# Patient Record
Sex: Male | Born: 1935 | Race: White | Hispanic: No | Marital: Married | Smoking: Never smoker
Health system: Southern US, Community
[De-identification: ages and names within clinical notes are randomized; demographics above are authoritative.]

## PROBLEM LIST (undated history)

## (undated) DIAGNOSIS — E119 Type 2 diabetes mellitus without complications: Secondary | ICD-10-CM

## (undated) DIAGNOSIS — C801 Malignant (primary) neoplasm, unspecified: Secondary | ICD-10-CM

## (undated) DIAGNOSIS — M1990 Unspecified osteoarthritis, unspecified site: Secondary | ICD-10-CM

## (undated) DIAGNOSIS — I10 Essential (primary) hypertension: Secondary | ICD-10-CM

## (undated) HISTORY — PX: BACK SURGERY: SHX140

## (undated) HISTORY — PX: CHOLECYSTECTOMY: SHX55

## (undated) HISTORY — PX: COLOSTOMY: SHX63

---

## 1999-06-25 ENCOUNTER — Encounter (INDEPENDENT_AMBULATORY_CARE_PROVIDER_SITE_OTHER): Payer: Self-pay | Admitting: *Deleted

## 1999-06-25 ENCOUNTER — Ambulatory Visit (HOSPITAL_BASED_OUTPATIENT_CLINIC_OR_DEPARTMENT_OTHER): Admission: RE | Admit: 1999-06-25 | Discharge: 1999-06-25 | Payer: Self-pay | Admitting: Orthopedic Surgery

## 2000-04-14 ENCOUNTER — Ambulatory Visit (HOSPITAL_BASED_OUTPATIENT_CLINIC_OR_DEPARTMENT_OTHER): Admission: RE | Admit: 2000-04-14 | Discharge: 2000-04-14 | Payer: Self-pay | Admitting: Orthopedic Surgery

## 2000-04-14 ENCOUNTER — Encounter (INDEPENDENT_AMBULATORY_CARE_PROVIDER_SITE_OTHER): Payer: Self-pay | Admitting: Specialist

## 2004-04-12 ENCOUNTER — Ambulatory Visit (HOSPITAL_COMMUNITY): Admission: RE | Admit: 2004-04-12 | Discharge: 2004-04-12 | Payer: Self-pay | Admitting: Internal Medicine

## 2004-04-15 ENCOUNTER — Ambulatory Visit (HOSPITAL_COMMUNITY): Admission: RE | Admit: 2004-04-15 | Discharge: 2004-04-15 | Payer: Self-pay | Admitting: Internal Medicine

## 2004-04-24 ENCOUNTER — Encounter (HOSPITAL_COMMUNITY): Admission: RE | Admit: 2004-04-24 | Discharge: 2004-05-24 | Payer: Self-pay | Admitting: Oncology

## 2004-04-24 ENCOUNTER — Encounter: Admission: RE | Admit: 2004-04-24 | Discharge: 2004-04-24 | Payer: Self-pay | Admitting: Oncology

## 2004-04-25 ENCOUNTER — Ambulatory Visit: Admission: RE | Admit: 2004-04-25 | Discharge: 2004-07-08 | Payer: Self-pay | Admitting: Radiation Oncology

## 2004-04-25 ENCOUNTER — Ambulatory Visit (HOSPITAL_COMMUNITY): Admission: RE | Admit: 2004-04-25 | Discharge: 2004-04-25 | Payer: Self-pay | Admitting: Oncology

## 2004-05-06 ENCOUNTER — Ambulatory Visit (HOSPITAL_COMMUNITY): Admission: RE | Admit: 2004-05-06 | Discharge: 2004-05-06 | Payer: Self-pay | Admitting: Radiation Oncology

## 2004-05-24 ENCOUNTER — Ambulatory Visit (HOSPITAL_COMMUNITY): Payer: Self-pay | Admitting: Oncology

## 2004-05-27 ENCOUNTER — Encounter: Admission: RE | Admit: 2004-05-27 | Discharge: 2004-05-27 | Payer: Self-pay | Admitting: Oncology

## 2004-05-27 ENCOUNTER — Encounter (HOSPITAL_COMMUNITY): Admission: RE | Admit: 2004-05-27 | Discharge: 2004-06-26 | Payer: Self-pay | Admitting: Oncology

## 2004-07-09 ENCOUNTER — Encounter: Admission: RE | Admit: 2004-07-09 | Discharge: 2004-07-09 | Payer: Self-pay | Admitting: Oncology

## 2004-07-09 ENCOUNTER — Encounter (HOSPITAL_COMMUNITY): Admission: RE | Admit: 2004-07-09 | Discharge: 2004-08-08 | Payer: Self-pay | Admitting: Oncology

## 2004-07-10 ENCOUNTER — Ambulatory Visit (HOSPITAL_COMMUNITY): Admission: RE | Admit: 2004-07-10 | Discharge: 2004-07-10 | Payer: Self-pay | Admitting: Oncology

## 2004-07-24 ENCOUNTER — Ambulatory Visit (HOSPITAL_COMMUNITY): Payer: Self-pay | Admitting: Oncology

## 2004-08-09 ENCOUNTER — Inpatient Hospital Stay (HOSPITAL_COMMUNITY): Admission: RE | Admit: 2004-08-09 | Discharge: 2004-08-17 | Payer: Self-pay | Admitting: General Surgery

## 2004-09-04 ENCOUNTER — Encounter (HOSPITAL_COMMUNITY): Admission: RE | Admit: 2004-09-04 | Discharge: 2004-10-04 | Payer: Self-pay | Admitting: Oncology

## 2004-09-04 ENCOUNTER — Encounter: Admission: RE | Admit: 2004-09-04 | Discharge: 2004-09-04 | Payer: Self-pay | Admitting: Oncology

## 2005-01-30 ENCOUNTER — Encounter: Admission: RE | Admit: 2005-01-30 | Discharge: 2005-01-30 | Payer: Self-pay | Admitting: Oncology

## 2005-01-30 ENCOUNTER — Ambulatory Visit (HOSPITAL_COMMUNITY): Payer: Self-pay | Admitting: Oncology

## 2005-01-30 ENCOUNTER — Encounter (HOSPITAL_COMMUNITY): Admission: RE | Admit: 2005-01-30 | Discharge: 2005-03-01 | Payer: Self-pay | Admitting: Oncology

## 2005-07-10 ENCOUNTER — Ambulatory Visit (HOSPITAL_COMMUNITY): Payer: Self-pay | Admitting: Oncology

## 2005-07-10 ENCOUNTER — Encounter: Admission: RE | Admit: 2005-07-10 | Discharge: 2005-07-10 | Payer: Self-pay | Admitting: Oncology

## 2005-07-10 ENCOUNTER — Encounter (HOSPITAL_COMMUNITY): Admission: RE | Admit: 2005-07-10 | Discharge: 2005-07-10 | Payer: Self-pay | Admitting: Oncology

## 2005-07-15 ENCOUNTER — Ambulatory Visit (HOSPITAL_COMMUNITY): Admission: RE | Admit: 2005-07-15 | Discharge: 2005-07-15 | Payer: Self-pay | Admitting: Oncology

## 2005-07-23 ENCOUNTER — Encounter: Admission: RE | Admit: 2005-07-23 | Discharge: 2005-07-23 | Payer: Self-pay | Admitting: Oncology

## 2005-09-16 ENCOUNTER — Emergency Department (HOSPITAL_COMMUNITY): Admission: EM | Admit: 2005-09-16 | Discharge: 2005-09-16 | Payer: Self-pay | Admitting: Emergency Medicine

## 2006-01-09 ENCOUNTER — Encounter: Admission: RE | Admit: 2006-01-09 | Discharge: 2006-01-09 | Payer: Self-pay | Admitting: Oncology

## 2006-01-09 ENCOUNTER — Encounter (HOSPITAL_COMMUNITY): Admission: RE | Admit: 2006-01-09 | Discharge: 2006-02-08 | Payer: Self-pay | Admitting: Oncology

## 2006-01-12 ENCOUNTER — Ambulatory Visit (HOSPITAL_COMMUNITY): Admission: RE | Admit: 2006-01-12 | Discharge: 2006-01-12 | Payer: Self-pay | Admitting: Oncology

## 2006-01-16 ENCOUNTER — Ambulatory Visit (HOSPITAL_COMMUNITY): Payer: Self-pay | Admitting: Oncology

## 2007-01-14 ENCOUNTER — Encounter (HOSPITAL_COMMUNITY): Admission: RE | Admit: 2007-01-14 | Discharge: 2007-02-13 | Payer: Self-pay | Admitting: Oncology

## 2007-01-14 ENCOUNTER — Ambulatory Visit (HOSPITAL_COMMUNITY): Payer: Self-pay | Admitting: Oncology

## 2007-01-15 ENCOUNTER — Ambulatory Visit (HOSPITAL_COMMUNITY): Admission: RE | Admit: 2007-01-15 | Discharge: 2007-01-15 | Payer: Self-pay | Admitting: Oncology

## 2007-07-30 ENCOUNTER — Ambulatory Visit (HOSPITAL_COMMUNITY): Admission: RE | Admit: 2007-07-30 | Discharge: 2007-07-30 | Payer: Self-pay | Admitting: Pediatrics

## 2008-01-14 ENCOUNTER — Encounter (HOSPITAL_COMMUNITY): Admission: RE | Admit: 2008-01-14 | Discharge: 2008-02-13 | Payer: Self-pay | Admitting: Oncology

## 2008-01-14 ENCOUNTER — Ambulatory Visit (HOSPITAL_COMMUNITY): Payer: Self-pay | Admitting: Oncology

## 2009-01-25 ENCOUNTER — Ambulatory Visit (HOSPITAL_COMMUNITY): Payer: Self-pay | Admitting: Oncology

## 2009-02-14 ENCOUNTER — Encounter: Payer: Self-pay | Admitting: Internal Medicine

## 2010-01-23 ENCOUNTER — Ambulatory Visit (HOSPITAL_COMMUNITY): Payer: Self-pay | Admitting: Oncology

## 2010-02-07 ENCOUNTER — Encounter: Payer: Self-pay | Admitting: Internal Medicine

## 2010-05-29 ENCOUNTER — Telehealth (INDEPENDENT_AMBULATORY_CARE_PROVIDER_SITE_OTHER): Payer: Self-pay

## 2010-08-08 ENCOUNTER — Encounter (INDEPENDENT_AMBULATORY_CARE_PROVIDER_SITE_OTHER): Payer: Self-pay | Admitting: *Deleted

## 2010-08-20 NOTE — Progress Notes (Addendum)
Summary: tcs recall  Phone Note Outgoing Call   Summary of Call: pt on recall list from 2001 tcs. since tcs in 2001 pt had a tcs in 2005 and had cancer and was treated and has colostomy. pt did not return phone calls for repeat tcs in 2007.  Does pt need tcs now? and do you want ov or triage? please advise Initial call taken by: Hendricks Limes LPN,  May 29, 2010 4:18 PM     Appended Document: tcs recall sounds like he's overdue; needs ov; if no cntact, let pcp know  Appended Document: tcs recall pt said he would have to call me back later

## 2010-08-20 NOTE — Letter (Signed)
Summary: External Other  External Other   Imported By: Minna Merritts 02/07/2010 16:22:02  _____________________________________________________________________  External Attachment:    Type:   Image     Comment:   External Document

## 2010-08-22 NOTE — Letter (Signed)
Summary: Generic Letter, Intro to Referring  [facility]  [address]   [place], [place] [address]   Phone: [phone]  Fax: [phone]      August 08, 2010             RE: Ralph Yang   [birth year]                 [address]                 [place], [place]  [address]  Dear Kemper Durie,  Above patient is overdue for his colonoscopy and needs office visit with [place] to set up procedure. I called patient to set up appointment, but he wants to wait and said he would get back with [place] later to set that up.            Sincerely,    Diana Eves  [facility] Ph: [phone]   Fax: [phone]

## 2010-12-06 NOTE — Op Note (Signed)
NAME:  RILEE, Ralph Yang              ACCOUNT NO.:  [id number]   MEDICAL RECORD NO.:  [id number]          PATIENT TYPE:  AMB   LOCATION:  DAY                           FACILITY:  [facility]   PHYSICIAN:  Jerolyn Shin C. Katrinka Blazing, M.D.   DATE OF BIRTH:  1933-10-19   DATE OF PROCEDURE:  08/09/2004  DATE OF DISCHARGE:                                 OPERATIVE REPORT   PREOPERATIVE DIAGNOSIS:  Squamous cell carcinoma of the anus.   POSTOPERATIVE DIAGNOSIS:  Squamous cell carcinoma of the anus.   PROCEDURE:  Abdominal perineal resection.   SURGEONS:  Dr. Katrinka Blazing assisted by _________ Earlene Plater.   DESCRIPTION:  Under general anesthesia, the patient's abdomen and perineum  were prepped and draped in the sterile field. He was in lithotomy position.  Midline incision was made. The abdomen was explored, and there did not seem  to be any disease of the pelvis, periaortic area, liver. The liver, stomach,  pancreas, residual colon and residual rectum, small bowel all were normal.  The left colon was mobilized starting at about the mid descending colon  level and extending down to the peritoneal reflection. This was done along  the lateral line of Toldt. In the pelvis, the peritoneum overlying the  rectum was incised close to the rectum in order to leave enough tissue for  pelvic closure. The lateral stalks were divided sequentially using the LDS  stapler. The superior hemorrhoidal vessels posteriorly were clamped with the  LDS stapler. Dissection was continued down in the hollow of the sacrum  without difficulty. It was then extended circumferentially and then ended up  anterior to the rectum between the rectum and the bladder and then prostate.  This fascia was bluntly dissected. The dissection was continued down to the  tip of the sacrum. Once this was done, the distal sigmoid was divided about  8-10 cm above the peritoneal reflection. This was done using GIA 60 stapler.  The ureters were identified on both sides,  and it was confirmed that there  were out of the field of dissection. Once this was completed, attention was  then turned to the peritoneum. A new setup was obtained. An elliptical  incision was made around the anus, and wide excision of the perianal tissue  including portion of the muscle of the floor of the pelvis were excised  under direct vision with electrocautery. Dissection was continued down into  the tip of the coccyx and then into the hollow of the sacrum and the free  pelvic space. The pelvic floor muscles were then divided circumferentially  with electrocautery, and the specimen was delivered without difficulty. The  specimen was not opened because it had some stool. It was passed off as a  specimen. The residual pelvic floor was _________ no nodal tissue was  visible, and on the right posterolateral and posterior aspect, the muscles  and fascia were totally removed so as to get a wide excision in the area of  the tumor. Irrigation was carried out. JP drains were placed intra-  abdominally and were secured with 3-0 nylon. The pelvic floor was then  closed in layers using running and interrupted 2-0 Monocryl. Subcutaneous  tissue was closed with 2-0 Monocryl. Skin was closed with interrupted 3-0  Prolene. New gowns and gloves were obtained, and then irrigation of the  pelvis was carried out intra-abdominally. The pelvic floor was  reperitonealized using 2-0 _________ in the left lower quadrant about half  the distance between the umbilicus and anterior superior iliac spine. The  opening was extended through the wall of the abdomen and the bowel was then  delivered through the abdominal wall without tension and with good  mesenteric pulsatile flow. The wall of the colon was sutured  circumferentially to the peritoneum intra-abdominally. The mesenteric defect  was attached to the lateral pelvic ________.  The fascia and peritoneum were  closed with running #1 Prolene. Subcu tissue  was closed with 3-0 Monocryl.  The skin was closed with staples. Dressing was placed. Once this was done,  the wall of the colon was sutured to the anterior rectus fascia using  interrupted 3-0 silk. The end of the colon was excised, and the residual  bowel was very viable with good pulsatile blood flow. The  end of the bowel  was then sutured to the dermis using initially interrupted 3-0 Vicryl and  then running circumferential locking 4-0 Vicryl. The stoma continued to look  viable. The stomal appliance was placed. The patient tolerated procedure  well. He was awakened from anesthesia uneventfully, transferred to a bed and  taken to the postanesthetic care unit for monitoring.      LCS/MEDQ  D:  08/09/2004  T:  08/09/2004  Job:  [id number]   cc:   R. Roetta Sessions, M.D.  [address]  [place]  [place] [address]   Ladona Horns. Neijstrom, MD  [address]  [place]  [place] [address]  Fax: [phone]   Francoise Schaumann. Halm, D.O.  [address]  [place]  [place] [address]  Fax: [phone]

## 2010-12-06 NOTE — H&P (Signed)
NAME:  Ralph Yang, Ralph Yang              ACCOUNT NO.:  [id number]   MEDICAL RECORD NO.:  [id number]          PATIENT TYPE:  AMB   LOCATION:  DAY                           FACILITY:  [facility]   PHYSICIAN:  Jerolyn Shin C. Katrinka Blazing, M.D.   DATE OF BIRTH:  Oct 15, 1933   DATE OF ADMISSION:  DATE OF DISCHARGE:  LH                                HISTORY & PHYSICAL   HISTORY OF PRESENT ILLNESS:  A 77 year old male who presented in early  September with rectal ulcers, who had episodes of rectal bleeding.  Endoscopy revealed 2 large posterior rectal ulcers, and biopsies revealed  squamous cell carcinoma with basaloid __________.  On CT scan, there was  extension through the wall with a single node.  The patient was felt to have  advanced at least stage III carcinoma, and it was felt that it would be  better for him to undergo chemotherapy and radiation therapy prior to  resection.  He was treated by Dr. Mariel Sleet with Xeloda and cisplatin.  This  was with radiation therapy with a dose of 540 CGY in 27 __________.  The  patient has had follow up, and CT and PET scan showed minimal activity.  There was disappearance of all activity in the lymph nodes on PET scan.  The  patient is referred for completion of surgical therapy.  His main concern is  that he will have a colostomy.  He is informed that it is impossible to do a  good surgical excision without colostomy because the mass extended through  the dentate line into the anal canal, and in order to get any type of  adequate resection of the bed of the cancer with nodes, his anus will have  to be resected.  He and his wife have been informed that he will need to  have abdominoperineal resection for the best chance of cure.  They were  initially reluctant to accept this, but finally have decided that they will  be able to have the surgery and proceed with abdominoperineal dissection.   PAST HISTORY:  1.  He has a history which suggests diabetes mellitus.  2.   Gastroesophageal reflux disease.  3.  Osteoarthritis.   MEDICATIONS:  He did not bring his medications to the office, and he did not  remember the name of his medications.   SURGERY:  1.  Back surgery x2.  2.  Cholecystectomy.  3.  Release of Dupuytren's contracture of both hands.   ALLERGIES:  SULFA.   FAMILY HISTORY:  Positive for hypertension, atherosclerotic heart disease,  diabetes, and some type of cancer.   PHYSICAL EXAMINATION:  VITAL SIGNS:  Blood pressure 130/90, pulse 72,  respirations 18.  HEENT:  Unremarkable, except for full dentures.  NECK:  Supple.  No JVD, bruit, adenopathy, or thyromegaly.  CHEST:  Clear to auscultation.  HEART:  Regular rate and rhythm without murmur, gallop, or rub.  ABDOMEN:  Soft, nontender, without masses.  RECTAL:  Enlarged prostate.  A scar in the area of the previous anorectal  mass posteriorly, but with some definite induration.  EXTREMITIES:  No  cyanosis, clubbing, or edema.  NEUROLOGIC:  No focal motor, sensory, or cerebellar deficit.   IMPRESSION:  1.  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  2.  History of diabetes mellitus.  3.  Gastroesophageal reflux disease.  4.  Osteoarthritis.   PLAN:  Abdominoperineal resection with colostomy.      LCS/MEDQ  D:  08/08/2004  T:  08/09/2004  Job:  [id number]   cc:   [facility]

## 2010-12-06 NOTE — Discharge Summary (Signed)
NAME:  Ralph Yang, Ralph Yang              ACCOUNT NO.:  [id number]   MEDICAL RECORD NO.:  [id number]          PATIENT TYPE:  INP   LOCATION:  A220                          FACILITY:  [facility]   PHYSICIAN:  Jerolyn Shin C. Katrinka Blazing, M.D.   DATE OF BIRTH:  1934/02/17   DATE OF ADMISSION:  08/09/2004  DATE OF DISCHARGE:  01/28/2006LH                                 DISCHARGE SUMMARY   DISCHARGE DIAGNOSES:  1.  Squamous cell carcinoma of the anus.  2.  Postoperative anemia.  3.  Diabetes mellitus.  4.  Gastroesophageal reflux disease.  5.  Osteoarthritis.   SPECIAL PROCEDURES:  On August 09, 2004 he had an abdominal perineal  resection.   DISPOSITION:  The patient is discharged home in stable and satisfactory  condition.   DISCHARGE MEDICATIONS:  1.  Tylox 2 every 4 hours as needed for pain.  2.  Glyburide 2.5 mg daily.   FOLLOW UP:  The patient is scheduled to be seen in the office two weeks post  discharge.  He will have home health nursing care daily for wound care and  for management of his stoma and his peritoneal drains.   SUMMARY:  A 77 year old male with a history of rectal ulcers dating back to  September 2005.  Endoscopic biopsies revealed squamous cell carcinoma with  basaloid features.  A CT scan revealed extension through the wall with a  single node.  The patient was felt to have stage III carcinoma and he was  treated preoperatively with Xeloda and cisplatin.  He then underwent  radiation therapy.  A followup CT scan and PET scan showed minimal activity  with __________ of all activity in the lymph node on the PET scan.  The  patient was referred for completion surgical treatment.  We had a long  discussion about a permanent colostomy.  After the patient was comfortable  with this he underwent a preoperative bowel prep at home and was admitted on  day surgery on August 09, 2004.  An abdominal peritoneum resection was done  uneventfully.  On the specimen there was right posterior  lateral induration  of the anus at the dentate line with no gross tumor.  There was mild  induration posteriorly above the dentate line with no gross disease.  No  nodes were noted intraoperatively.  The patient had a smooth postoperative  course except for a drop in his hemoglobin, which was not anticipated.  His  stoma started working by August 12, 2004.  When his hemoglobin dropped to 8  he was transfused two units of packed cells.  He had no other problem except  for mild hypokalemia with a potassium of 3.2.  This was treated with IV  potassium treatment.  The patient was seen for  colostomy teaching and he seemed to accept it quite nicely.  By August 17, 2004 he was stable and had no complaints.  He felt comfortable.  He had no  difficulty with his stoma.  His perineum was healing well.  Hemoglobin was  10.4 and he was discharged home with plans to follow up  by home health  nursing service.      LCS/MEDQ  D:  09/29/2004  T:  09/30/2004  Job:  [id number]

## 2010-12-06 NOTE — Consult Note (Signed)
NAME:  Ralph Yang, Ralph Yang                       ACCOUNT NO.:  [id number]   MEDICAL RECORD NO.:  [id number]                    PATIENT TYPE:   LOCATION:                                       FACILITY:   PHYSICIAN:  R. Roetta Sessions, M.D.              DATE OF BIRTH:  October 09, 1933   DATE OF CONSULTATION:  03/27/2004  DATE OF DISCHARGE:                                   CONSULTATION   REASON FOR CONSULTATION:  1.  Hematochezia.  2.  Hemoccult-positive stool.   HISTORY OF PRESENT ILLNESS:  The patient is a pleasant 77 year old gentleman  kindly sent over at the courtesy of Francoise Schaumann. Halm, D.O., to further  evaluate several episodes of blood per rectum.  The patient has seen some  blood per rectum when wiping and on stool, at times with associated  constipation.  He is known to have internal hemorrhoids.  He had a  colonoscopy in October of 2001.  He also had a hyperplastic polyp removed.  This procedure was done largely for hematochezia at that time.  He has gone  nearly four years without any symptoms until recently.  Dr. Milford Cage performed a  rectal exam which revealed Hemoccult-positive stool and hemorrhoids.   He has not had any associated abdominal pain or upper GI tract symptoms.  No  melena.  He has not lost any weight or any other interim problems.  There is  no family history of colorectal neoplasia.   PAST MEDICAL HISTORY:  Significant for:  1.  Diabetes mellitus, type 2.  2.  Back pain.   PAST SURGICAL HISTORY:  1.  Cholecystectomy.  2.  Back surgery.  3.  Right hand surgery for contracture.   CURRENT MEDICATIONS:  1.  Glyburide 2.5 mg tablet half of a tablet daily.  2.  Ibuprofen 200 mg two tablets at bedtime.  3.  ASA 1 mg daily.   ALLERGIES:  SULFA.   FAMILY HISTORY:  His father died with an aneurysm.  His mother died with a  myocardial infarction.  No history of chronic GI or liver illness.   SOCIAL HISTORY:  The patient is a widower.  He is retired.  No tobacco.   No  alcohol.   REVIEW OF SYSTEMS:  No chest pain, dyspnea on exertion, fever, chills or  change in weight.   PHYSICAL EXAMINATION:  GENERAL APPEARANCE:  A pleasant 77 year old gentleman  resting comfortably.  WEIGHT:  195 pounds.  HEIGHT:  6 feet.  VITAL SIGNS:  BP 115/70, pulse 64.  SKIN:  Warm and dry.  HEENT:  No scleral icterus.  NECK:  JVD is not prominent.  CHEST:  Lungs are clear to auscultation.  CARDIAC:  Regular rate and rhythm without murmur, rub or gallop.  ABDOMEN:  Nondistended.  Positive bowel sounds.  Soft and nontender without  appreciable mass or organomegaly.  EXTREMITIES:  No edema.  RECTAL:  Deferred to colonoscopy.  IMPRESSION:  The patient is a pleasant 77 year old gentleman with recurrent  low-volume painless hematochezia that has been documented to be Hemoccult  positive.   He had internal hemorrhoids and a benign polyp removed from his colon almost  four years ago.   It sounds as though he has now recurrence of benign anorectal bleeding,  however, because it has been nearly four years ago since his colon and  rectum were last imaged, I feel the safest approach will be to go ahead at  this point in time and image his rectum and colon once again as he could  have developed potentially new pathology in the interim time.  I discussed  the approach of colonoscopy now along with the potential risks, benefits and  alternatives with the patient.  His questions were answered.  He is  agreeable to go ahead and perform the procedure in the near future at [facility] and then await further recommendations following the  procedure.   I would like to thank Dr. Milford Cage as always for his kind referral and his  continued confidence in me.      ___________________________________________                                            Jonathon Bellows, M.D.   RMR/MEDQ  D:  03/27/2004  T:  03/27/2004  Job:  [id number]

## 2010-12-10 NOTE — Op Note (Signed)
[place]  Patient:    Ralph Yang, Ralph Yang                     MRN: [id number] Proc. Date: 04/14/00 Adm. Date:  [id number] Attending:  Susa Day CC:         Katy Fitch. Sypher, Montez Hageman., M.D. (2)   Operative Report  PREOPERATIVE DIAGNOSES: 1. Severe Dupuytrens contracture, right small finger, with 100-degree flexion    contracture of proximal interphalangeal joint, and hyperextension posture    of distal interphalangeal joint due to contracture of spiral oblique    retinacular ligament. 2. 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 3. Thumb and index web space Dupuytrens contracture of natatory ligaments,    left hand.  POSTOPERATIVE DIAGNOSES: 1. Severe Dupuytrens contracture, right small finger, with 100-degree flexion    contracture of proximal interphalangeal joint, and hyperextension  posture    of distal interphalangeal joint due to contracture of spiral oblique    retinacular ligament. 2. 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 3. Thumb and index web space Dupuytrens contracture of natatory ligaments,    left hand.  OPERATION: 1. Excision of extensive Dupuytrens contracture from the left small finger    with the removal of pretendinous fibers, spiral bands, contractures    involving the flexor digiti mini and abductor digiti mini muscles,    lateral fascial _____, and spiral oblique retinacular ligament    contracture. 2. Excision of Dupuytrens contracture, pretendinous fibers to ring finger    and palm. 3. Excision of Dupuytrens contracture, pretendinous fibers to long finger    and palm. 4. Excision of Dupuytrens contracture to thumb and index web space, natatory    ligament involvement.  SURGEON:  Katy Fitch. Sypher, Montez Hageman., M.D.  ASSISTANT:  Marveen Reeks Dasnoit, P.A.-C.  ANESTHESIA:  General by LMA.  ANESTHESIOLOGIST:  Halford Decamp, M.D.  INDICATIONS:  Ralph Yang is a 77 year old man who has had longstanding bilateral Dupuytrens contracture with severe, greater than 90-degree flexion contractures of his small finger, PIP joints bilaterally.  He is status post successful surgery on the right, and now presents for surgery on the left.  Preoperatively he was advised that he was at risk for neurovascular injury and residual contracture due to the fact that he allowed his contractures to progress until his small fingers were essentially flat and against the palm. Despite these risks, he has had an excellent result on the right side, with near full range of motion and excellent sensibility, and now on the left he requests an identical surgery.  After the questions were invited and answered, he was brought to the operating room at this time for an excision of his pathologic fracture from the left hand.  DESCRIPTION OF PROCEDURE:  Ralph Yang is brought to the operating room and placed in the supine position on the operating room table.  Following the induction of general anesthesia, the left arm was prepped with Betadine soap and solution and sterilely draped.  Ancef 1 g was administered as an IV prophylactic antibiotic.  Brunner zigzag incisions were planned, allowing extension to V-Y advancement flaps, to gain skin length in the palmar aspect of the finger and to remove excess skin.  The thumb and index web space was addressed with a transverse incision across the web.  The skin incisions were taken  sharply with great care to identify the neurovascular structures and the perforating vessels to the skin, as well as cutaneous nerve branches.  The pretendinous nerve fibers to the small, ring, and long fingers were meticulously dissected off of the deep surface of the dermis.  A distally-based flap was created from a midpalmar incision, allowing the removal of the fascia to the long and ring fingers.   This relieved the early flexion contractures of the MP joints of the long and ring fingers.  In the small finger the Brunner zigzag incisions were extended across the proximal, middle, and distal phalanges.  A very complex dissection involving the removal of contracture to the abductor digiti and flexor digiti mini muscle fascia, a large lateral sheet was removed from the ulnar aspect of the small finger.  The ulnar neurovascular bundle was displaced radial to the midline with a large spiral band, and a large nodular involvement was identified at the level of the middle phalanx, and the dip joint.  The neurovascular bundles were protected throughout the dissection.  After the release of the visible pathologic fascia, we were able to correct the PIP joint flexion contracture to approximately 30 degrees; however, we could not flex the DIP joint with flexion to the PIP, due to severe contracture of the spiral oblique retinacular ligament.  This was released on both the radial and the ulnar side of the PIP joint, with excision of the C1 ligament distal limb, and release of the visible fibers of the spiral oblique ligament.  This allowed complete extension of the PIP joint and 70 degrees of flexion of the DIP joint, with the PIP fully extended.  There was no sign of residual intrinsic or extrinsic tightness.  The extensor mechanism was slightly "baggy" over the PIP joint due to the longstanding severe flexion contracture.  The contracture in the thumb and index web space was addressed through a transverse incision that was used to expose the natatory ligaments which were then resected.  At the conclusion of the procedure, the tourniquet was released with immediate capillary refill in all fingers.  There was some slow capillary refill to the ulnar aspect of the small finger, where we had undermined an extensive area of skin due to severe involvement of the subdermal tissues.  After one minute  this had excellent perfusion with good bleeding along the margins of the subdermal plexus on the skin flaps.   The V-Y advancement flaps were extended approximately 8.0 mm, to remove excess skin overlying the proximal phalangeal segment of the small finger, and redundant skin was resected.  The flaps were inset with corner sutures of #5-0 nylon, and the wounds were repaired with interrupted sutures of #5-0 nylon. The wound was held with compression for approximately five minutes, to obtain complete hemostasis.  The hand was then thoroughly lavaged with sterile saline, cleaned, dressed with Xeroflo, Silvadene, sterilely gauze, Kerlix, acrylic fluff, and a volar plaster splint, maintaining the wrist in 15 degrees of dorsiflexion and the MP joints in full extension.  There were no apparent complications.  Mr. Tremblay had very satisfactory capillary refill in his fingers at the conclusion of the dressing, and 0.25%$ Marcaine was infiltrated in the wrist level over the median and ulnar nerves for postoperative block.  Mr. Barefoot tolerated the surgery and the anesthesia well.  He was transferred to the recovery room with stable vital signs.  For aftercare he is given prescriptions for Motrin 600 mg one p.o. q.6h. p.r.n. pain, #30 tablets  without refill, and Keflex 500 mg one p.o. q.8h. x 4 days as prophylactic antibiotic, and Percocet 5/325 one or two p.o. q.4-6h. p.r.n. pain, #30 tablets with no refill.  He will be discharged in the care of his wife. DD:  04/14/00 TD:  04/15/00 Job: [id number]

## 2010-12-10 NOTE — Op Note (Signed)
NAME:  Ralph Yang, Ralph Yang              ACCOUNT NO.:  [id number]   MEDICAL RECORD NO.:  [id number]          PATIENT TYPE:  AMB   LOCATION:  DAY                           FACILITY:  [facility]   PHYSICIAN:  R. Roetta Sessions, M.D. DATE OF BIRTH:  08-15-1933   DATE OF PROCEDURE:  04/12/2004  DATE OF DISCHARGE:                                 OPERATIVE REPORT   PROCEDURE:  Colonoscopy with biopsy, snare polypectomy.   INDICATIONS FOR PROCEDURE:  The patient is a 77 year old gentleman referred  by Dr. Milford Cage for further evaluation of intermittent paper hematochezia over  the last several weeks.  He had a colonoscopy in 2001 for similar symptoms.  He was found to have internal hemorrhoids, and he had a hyperplastic polyp  removed.  He also had diverticulosis.  He had done well without any blood  per rectum until recently.  Colonoscopy is now being done to evaluate his  symptoms.  This approach has been discussed with the patient at length.  The  potential risks, benefits, and alternatives have been reviewed and questions  answered.  He is agreeable.  Please see my dictated consultation note.   PROCEDURE:  O2 saturation, blood pressure, pulses, and respirations were  monitored throughout the entirety of the procedure.  Conscious sedation was  with Versed 2 mg IV, Demerol 50 mg IV.  The instrument used was the Olympus  video chip system.   FINDINGS:  Digital rectal examination revealed two somewhat hard and firm  nodules just inside the anal verge at the 12 o'clock position, and there was  some blood return on the examining finger.   ENDOSCOPIC FINDINGS:  The prep was good.   Rectum:  Examination of the rectal mucosa demonstrated two sessile polypoid  masses in the distal rectum approximately 2 cm each.  The more proximal  lesion was a little larger, with a central area of depression.  The most  proximal extent of these lesions were approximately 4 cm in from the anal  verge, and again, they were 2  cm wide.  They appeared to be almost two  separate lesions but spaced close together.  The remainder of the rectum  appeared normal.  Please see the en face and retroflexed photos.   Colon:  The colonic mucosa was surveyed from the rectosigmoid junction  through the left, transverse, right colon to the area of the appendiceal  orifice, ileocecal valve, and cecum.  These structures were well-seen and  photographed for the record.  From this level, the scope was slowly  withdrawn.  All previously mentioned mucosal surfaces were again seen.  The  patient had pancolonic diverticula and a 1-cm pedunculated polyp at the  splenic flexure.  The polyp at the splenic flexure was removed totally with  snare cautery and recovered through the scope.  The lesions in the rectum  were biopsied and debulked with snare cautery.  The patient tolerated the  procedure well and was reactive in endoscopy.   IMPRESSION:  1.  Two polypoid lesions in the distal rectum, as described above, extending      4  cm from the anal verge proximally.  The largest of the two lesions was      more proximally located, but, again, very distal in the rectum.      Biopsied, debulked.  Likely the cause of the patient's recent      hematochezia.  Otherwise normal rectum.  2.  Pancolonic diverticula.  3.  1-cm pedunculated polyp in the splenic flexure resected with the snare.   RECOMMENDATIONS:  1.  Will proceed with checking a CA, CBC, LFTs, and abdominopelvic CT.  2.  This gentleman will need surgical resection of this lesion, as it likely      contains carcinoma.  3.  I would hope that these lesions would be amenable to a transanal      resection.  4.  Further recommendations in the near future.      RMR/MEDQ  D:  04/12/2004  T:  04/12/2004  Job:  [id number]   cc:   Francoise Schaumann. Halm, D.O.  [address], [address]  [place]  [place] [address]  Fax: [phone]

## 2010-12-10 NOTE — Op Note (Signed)
Ralph Yang. Ralph Yang  Patient:    Ralph Yang                      MRN: [id number] Proc. Date: 06/25/99 Adm. Date:  [id number] Attending:  Derrek Monaco CC:         Ralph Yang., M.D. (2)                           Operative Report  PREOPERATIVE DIAGNOSIS:  Severe Dupuytrens contracture affecting right hand with 90 degree flexion contractures of metatarsophalangeal and proximal interphalangeal joints and in the small finger, a 70 degree flexion contracture of the distal interphalangeal joint causing finger and palm deformities of the ring and small  fingers, right hand.  POSTOPERATIVE DIAGNOSIS:  Severe Dupuytrens contracture affecting right hand with 90 degree flexion contractures of metatarsophalangeal and proximal interphalangeal joints and in the small finger, a 70 degree flexion contracture of the distal interphalangeal joint causing finger and palm deformities of the ring and small  fingers, right hand.  OPERATION PERFORMED:  Excision of an extremely complex Dupuytrens contracture from right palm pretendinous fibers to the long fiber, pretendinous fibers to the ring fingers, spiral band to the ring finger and lateral fascial sheath to the ing finger as well as flexor sheath release and proximal interphalangeal joint release. Excision of Dupuytrens contracture from right small finger with excision of spiral bands, lateral fascial sheaths, release of PIP joint and flexor sheath.  SURGEON:  Ralph Yang., M.D.  ASSISTANT:  Ralph Yang, P.A.  ANESTHESIA:  Axillary block followed by general orotracheal anesthesia.  SUPERVISING ANESTHESIOLOGIST:  Dr. Gypsy Yang.  INDICATIONS:  The patient is a 77 year old gentleman who has a case of neglected Dupuytrens contracture.  Several years ago he was advised to undergo surgical release.  He elected not to proceed with the recommended care.  He went on to develop finger  and palm deformities of his ring and small fingers.  He returned for a consult in November of 2000 and was advised to consider salvage procedure of fasciectomy and was advised at that time that he may experience significant complications due to the severe nature of his Dupuytrens disease.  Specific complications including reflex sympathetic dystrophy, nerve or blood vessel injury as well as recurrent contracture were discussed.  Salvage procedure such as fusion of his PIP joints and/or amputation of his ring or small finger ere recommended as long term possibilities.  Despite all these issues, he elected to proceed with attempted fasciectomy at this time.  DESCRIPTION OF PROCEDURE:  Ralph Yang was brought to the operating room and  placed in supine position on the operating table.  Axillary block placed in the  holding area by Dr. Gypsy Yang led to incomplete anesthesia of the right hand. Therefore general orotracheal anesthesia was induced.  The right arm was then prepped with Betadine soap and solution and sterilely draped.  Ancef 1 gm was administered as an IV prophylactic antibiotic.  The procedure commenced with planning of Brunner zigzag incisions.  The arm was  then exsanguinated with an Esmarch bandage.  The arterial tourniquet was inflated to 240 mmHg.  The skin flaps were gently elevated and meticulously the pathologic fascia was excised off the common digital vessels and common digital nerves. The natatory ligaments were excised and extensive contracture of the ring finger removed with involvement of lateral fascial sheath, spiral bands and  involvement of the flexor sheath.  After complete excision of the pathologic fascia in the ring finger, there was a small PIP flexion contracture which was gently released by manipulation of the PIP joint.  In the small finger, a Brunner zigzag incision as used to relieve the fascia contractures extending from the distal phalanx  to the proximal phalanx.  Several spiral bands were identified and carefully released.  Care was taken to identify and protect the neurovascular bundles throughout dissection.  The flexor sheath was contracted and required release of the C1 pulley.  The volar plate of the PIP joint was also released.  Attention was then directed to the palm where a large pretendinous cord to the ong finger was dissected subcutaneously and resected.  Thereafter the tourniquet was released and immediate capillary refill was noted in all fingers.  Bleeding was controlled with bipolar electrocautery and direct compression. The wounds were repaired with corner sutures of 5-0 nylon.  The skin was advanced in the small finger with a V to Y flap.  This allowed extension of the PIP despite the previous 90 degree flexion contracture.  The wounds were dressed with Xeroflo, Silvadene, sterile gauze, acrylic fluff and a volar plaster splint maintaining the MP and IP joints in maximum comfortable extension.  There were no apparent complications.  The patient tolerated the surgery and anesthesia well and was transferred to the recovery room with stable vital signs. DD:  06/25/99 TD:  06/26/99 Job: [id number]

## 2011-01-21 ENCOUNTER — Other Ambulatory Visit (HOSPITAL_COMMUNITY): Payer: Self-pay | Admitting: Oncology

## 2011-01-21 ENCOUNTER — Encounter (HOSPITAL_COMMUNITY): Payer: Medicare Other | Attending: Oncology | Admitting: Oncology

## 2011-01-21 DIAGNOSIS — C218 Malignant neoplasm of overlapping sites of rectum, anus and anal canal: Secondary | ICD-10-CM

## 2011-01-21 DIAGNOSIS — I10 Essential (primary) hypertension: Secondary | ICD-10-CM | POA: Insufficient documentation

## 2011-01-21 DIAGNOSIS — E119 Type 2 diabetes mellitus without complications: Secondary | ICD-10-CM | POA: Insufficient documentation

## 2011-01-21 DIAGNOSIS — Z85048 Personal history of other malignant neoplasm of rectum, rectosigmoid junction, and anus: Secondary | ICD-10-CM | POA: Insufficient documentation

## 2011-01-21 LAB — DIFFERENTIAL
Basophils Absolute: 0 10*3/uL (ref 0.0–0.1)
Basophils Relative: 1 % (ref 0–1)
Eosinophils Absolute: 0.5 10*3/uL (ref 0.0–0.7)
Eosinophils Relative: 7 % — ABNORMAL HIGH (ref 0–5)
Lymphocytes Relative: 31 % (ref 12–46)
Lymphs Abs: 2 10*3/uL (ref 0.7–4.0)
Monocytes Absolute: 0.5 10*3/uL (ref 0.1–1.0)
Monocytes Relative: 7 % (ref 3–12)
Neutro Abs: 3.6 10*3/uL (ref 1.7–7.7)
Neutrophils Relative %: 55 % (ref 43–77)

## 2011-01-21 LAB — CBC
HCT: 39.9 % (ref 39.0–52.0)
Hemoglobin: 13.6 g/dL (ref 13.0–17.0)
MCH: 31.7 pg (ref 26.0–34.0)
MCHC: 34.1 g/dL (ref 30.0–36.0)
MCV: 93 fL (ref 78.0–100.0)
Platelets: 161 10*3/uL (ref 150–400)
RBC: 4.29 MIL/uL (ref 4.22–5.81)
RDW: 13.3 % (ref 11.5–15.5)
WBC: 6.6 10*3/uL (ref 4.0–10.5)

## 2011-01-21 LAB — COMPREHENSIVE METABOLIC PANEL
ALT: 41 U/L (ref 0–53)
AST: 42 U/L — ABNORMAL HIGH (ref 0–37)
Albumin: 4 g/dL (ref 3.5–5.2)
Alkaline Phosphatase: 89 U/L (ref 39–117)
BUN: 17 mg/dL (ref 6–23)
CO2: 29 mEq/L (ref 19–32)
Calcium: 9.6 mg/dL (ref 8.4–10.5)
Chloride: 98 mEq/L (ref 96–112)
Creatinine, Ser: 1.05 mg/dL (ref 0.50–1.35)
GFR calc Af Amer: >60 mL/min (ref >60)
GFR calc non Af Amer: >60 mL/min (ref >60)
Glucose, Bld: 117 mg/dL — ABNORMAL HIGH (ref 70–99)
Potassium: 4.5 mEq/L (ref 3.5–5.1)
Sodium: 135 mEq/L (ref 135–145)
Total Bilirubin: 0.6 mg/dL (ref 0.3–1.2)
Total Protein: 7.8 g/dL (ref 6.0–8.3)

## 2011-01-21 LAB — CEA: CEA: 1.8 ng/mL (ref 0.0–5.0)

## 2011-04-17 LAB — DIFFERENTIAL
Basophils Absolute: 0
Basophils Relative: 0
Eosinophils Absolute: 1 — ABNORMAL HIGH
Eosinophils Relative: 16 — ABNORMAL HIGH
Lymphocytes Relative: 24
Lymphs Abs: 1.5
Monocytes Absolute: 0.5
Monocytes Relative: 8
Neutro Abs: 3.1
Neutrophils Relative %: 51

## 2011-04-17 LAB — COMPREHENSIVE METABOLIC PANEL
ALT: 30
AST: 29
Albumin: 3.9
Alkaline Phosphatase: 79
BUN: 14
CO2: 28
Calcium: 9.4
Chloride: 106
Creatinine, Ser: 1.05
GFR calc Af Amer: >60
GFR calc non Af Amer: >60
Glucose, Bld: 150 — ABNORMAL HIGH
Potassium: 4.5
Sodium: 140
Total Bilirubin: 1
Total Protein: 7

## 2011-04-17 LAB — CEA: CEA: 2.5

## 2011-04-17 LAB — CBC
HCT: 38.7 — ABNORMAL LOW
Hemoglobin: 13.7
MCHC: 35.4
MCV: 90.3
Platelets: 164
RBC: 4.28
RDW: 13.5
WBC: 6.1

## 2011-05-07 LAB — COMPREHENSIVE METABOLIC PANEL
ALT: 32
AST: 32
Albumin: 3.7
Alkaline Phosphatase: 72
BUN: 12
CO2: 28
Calcium: 8.9
Chloride: 101
Creatinine, Ser: 1.03
GFR calc Af Amer: >60
GFR calc non Af Amer: >60
Glucose, Bld: 240 — ABNORMAL HIGH
Potassium: 4.8
Sodium: 136
Total Bilirubin: 0.8
Total Protein: 6.4

## 2011-05-07 LAB — CBC
HCT: 36.4 — ABNORMAL LOW
Hemoglobin: 12.8 — ABNORMAL LOW
MCHC: 35.2
MCV: 88.9
Platelets: 155
RBC: 4.1 — ABNORMAL LOW
RDW: 13.2
WBC: 6

## 2011-05-07 LAB — DIFFERENTIAL
Eosinophils Absolute: 0.9 — ABNORMAL HIGH
Eosinophils Relative: 15 — ABNORMAL HIGH
Lymphocytes Relative: 28
Lymphs Abs: 1.7
Monocytes Absolute: 0.4
Monocytes Relative: 7

## 2011-05-07 LAB — CEA: CEA: 2

## 2013-05-05 ENCOUNTER — Other Ambulatory Visit (HOSPITAL_COMMUNITY): Payer: Self-pay | Admitting: Internal Medicine

## 2013-05-05 ENCOUNTER — Ambulatory Visit (HOSPITAL_COMMUNITY)
Admission: RE | Admit: 2013-05-05 | Discharge: 2013-05-05 | Disposition: A | Discharge status: Home / Self Care | Payer: Medicare Other | Source: Ambulatory Visit | Attending: Internal Medicine | Admitting: Internal Medicine

## 2013-05-05 DIAGNOSIS — Z9181 History of falling: Secondary | ICD-10-CM

## 2013-05-05 DIAGNOSIS — M1610 Unilateral primary osteoarthritis, unspecified hip: Secondary | ICD-10-CM | POA: Insufficient documentation

## 2013-05-05 DIAGNOSIS — M169 Osteoarthritis of hip, unspecified: Secondary | ICD-10-CM | POA: Insufficient documentation

## 2013-05-05 DIAGNOSIS — M25559 Pain in unspecified hip: Secondary | ICD-10-CM | POA: Insufficient documentation

## 2014-08-18 ENCOUNTER — Emergency Department (HOSPITAL_COMMUNITY)
Admission: EM | Admit: 2014-08-18 | Discharge: 2014-08-19 | Disposition: A | Discharge status: Home / Self Care | Payer: Medicare HMO | Attending: Emergency Medicine | Admitting: Emergency Medicine

## 2014-08-18 ENCOUNTER — Emergency Department (HOSPITAL_COMMUNITY): Payer: Medicare HMO

## 2014-08-18 ENCOUNTER — Encounter (HOSPITAL_COMMUNITY): Payer: Self-pay | Admitting: Emergency Medicine

## 2014-08-18 DIAGNOSIS — Z85048 Personal history of other malignant neoplasm of rectum, rectosigmoid junction, and anus: Secondary | ICD-10-CM | POA: Insufficient documentation

## 2014-08-18 DIAGNOSIS — W010XXA Fall on same level from slipping, tripping and stumbling without subsequent striking against object, initial encounter: Secondary | ICD-10-CM | POA: Diagnosis not present

## 2014-08-18 DIAGNOSIS — S42292A Other displaced fracture of upper end of left humerus, initial encounter for closed fracture: Secondary | ICD-10-CM | POA: Diagnosis not present

## 2014-08-18 DIAGNOSIS — E119 Type 2 diabetes mellitus without complications: Secondary | ICD-10-CM | POA: Insufficient documentation

## 2014-08-18 DIAGNOSIS — Y9389 Activity, other specified: Secondary | ICD-10-CM | POA: Diagnosis not present

## 2014-08-18 DIAGNOSIS — S42302A Unspecified fracture of shaft of humerus, left arm, initial encounter for closed fracture: Secondary | ICD-10-CM

## 2014-08-18 DIAGNOSIS — Y998 Other external cause status: Secondary | ICD-10-CM | POA: Diagnosis not present

## 2014-08-18 DIAGNOSIS — M25512 Pain in left shoulder: Secondary | ICD-10-CM

## 2014-08-18 DIAGNOSIS — W19XXXA Unspecified fall, initial encounter: Secondary | ICD-10-CM

## 2014-08-18 DIAGNOSIS — S4992XA Unspecified injury of left shoulder and upper arm, initial encounter: Secondary | ICD-10-CM | POA: Diagnosis present

## 2014-08-18 DIAGNOSIS — Y9289 Other specified places as the place of occurrence of the external cause: Secondary | ICD-10-CM | POA: Diagnosis not present

## 2014-08-18 DIAGNOSIS — I10 Essential (primary) hypertension: Secondary | ICD-10-CM | POA: Diagnosis not present

## 2014-08-18 HISTORY — DX: Malignant (primary) neoplasm, unspecified: C80.1

## 2014-08-18 HISTORY — DX: Type 2 diabetes mellitus without complications: E11.9

## 2014-08-18 HISTORY — DX: Essential (primary) hypertension: I10

## 2014-08-18 MED ORDER — FENTANYL CITRATE 0.05 MG/ML IJ SOLN
50.0000 ug | Freq: Once | INTRAMUSCULAR | Status: AC
  Administered 2014-08-18: 50 ug via INTRAVENOUS
  Filled 2014-08-18: qty 2

## 2014-08-18 MED ORDER — HYDROMORPHONE HCL 1 MG/ML IJ SOLN
1.0000 mg | Freq: Once | INTRAMUSCULAR | Status: AC
  Administered 2014-08-18: 1 mg via INTRAVENOUS
  Filled 2014-08-18: qty 1

## 2014-08-18 MED ORDER — TRAMADOL HCL 50 MG PO TABS: 50.0000 mg | ORAL_TABLET | Freq: Four times a day (QID) | ORAL | Status: DC | PRN

## 2014-08-18 MED ORDER — HYDROCODONE-ACETAMINOPHEN 5-325 MG PO TABS: 1.0000 | ORAL_TABLET | Freq: Four times a day (QID) | ORAL | Status: DC | PRN

## 2014-08-18 NOTE — ED Notes (Signed)
Pt returned from xray

## 2014-08-18 NOTE — Discharge Instructions (Signed)
Shoulder Fracture (Proximal Humerus or Glenoid) °A shoulder fracture is a broken upper arm bone or a broken socket bone. The humerus is the upper arm bone and the glenoid is the shoulder socket. Proximal means the humerus is broken near the shoulder. Most of the time the bones of a broken shoulder are in an acceptable position. Usually, the injury can be treated with a shoulder immobilizer or sling and swath bandage. These devices support the arm and prevent any shoulder movement. If the bones are not in a good position, then surgery is sometimes needed. Shoulder fractures usually initially cause swelling, pain, and discoloration around the upper arm. They heal in 8 to 12 weeks with proper treatment. °SYMPTOMS  °At the time of injury: °· Pain. °· Tenderness. °· Regular body contours are not normal. °Later symptoms may include: °· Swelling and bruising of the elbow and hand. °· Swelling and bruising of the arm or chest. °Other symptoms include: °· Pain when lifting or turning the arm. °· Paralysis below the fracture. °· Numbness or coldness below the fracture. °CAUSES  °· Indirect force from falling on an outstretched arm. °· A blow to the shoulder. °RISK INCREASES WITH: °· Not being in shape. °· Playing contact sports, such as football, soccer, hockey, or rugby. °· Sports where falling on an outstretched arm occurs, such as basketball, skateboarding, or volleyball. °· History of bone or joint disease. °· History of shoulder injury. °PREVENTION °· Warm up before activity. °· Stretch before activity. °· Stay in shape with your: °¨ Heart fitness. °¨ Flexibility. °¨ Shoulder Strength. °· Falling with the proper technique. °PROGNOSIS  °In adults, healing time is about 7 weeks. For children, healing time is about 5 weeks. Surgery may be needed. °RELATED COMPLICATIONS °· The bones do not heal together (nonunion). °· The bones do not align properly when they heal (malunion). °· Long-term problems with pain, stiffness,  swelling, or loss of motion. °· The injured arm heals shorter than the other. °· Nerves are injured in the arm. °· Arthritis in the shoulder. °· Normal bone growth is interrupted in children. °· Blood supply to the shoulder joint is diminished. °TREATMENT °If the bones are aligned, then initial treatment will be with ice and medicine to help with pain. The shoulder will be held in place with a sling (immobilization). The shoulder will be allowed to heal for up to 6 weeks. Injuries that may need surgery include: °· Severe fractures. °· Fractures that are not in appropriate alignment (displaced). °· Non-displaced fractures (not common). °Surgery helps the bones align correctly. The bones may be held in place with: °· Sutures. °· Wires. °· Rods. °· Plates. °· Screws. °· Pins. °If you have had surgery or not, you will likely be assisted by a physical therapist or athletic trainer to get the best results with your injured shoulder. This will likely include exercises to strengthen and stretch the injured and surrounding areas. °MEDICATION °· If pain medicine is needed, nonsteroidal anti-inflammatory medicines (such as aspirin or ibuprofen) or other minor pain relievers (such as acetaminophen) are often advised. °· Do not take pain medicine for 7 days before surgery. °· Stronger pain relievers may be prescribed. Use only as directed and take only as much as you need. °COLD THERAPY °Cold treatment (icing) relieves pain and reduces inflammation. Cold treatment should be applied for 10 to 15 minutes every 2 to 3 hours, and immediately after activity that aggravates your symptoms. Use ice packs or an ice massage. °SEEK IMMEDIATE   MEDICAL CARE IF: °· You have severe shoulder pain unrelieved by rest and taking pain medicine. °· You have pain, numbness, tingling, or weakness in the hand or wrist. °· You have shortness of breath, chest pain, severe weakness, or fainting. °· You have severe pain with motion of the fingers or  wrist. °· Blue, gray, or dark color appears in the fingernails on injured extremity. °Document Released: 07/07/2005 Document Revised: 09/29/2011 Document Reviewed: 10/19/2008 °[employer]® Patient Information ©2015 [employer]. This information is not intended to replace advice given to you by your health care provider. Make sure you discuss any questions you have with your health care provider. ° °

## 2014-08-18 NOTE — ED Notes (Signed)
Per ems, mechanical trip and fall landing on left shoulder.  Pt arrives awake, alert, oriented, c/o left shoulder pain.  Pt received 10mg  morphine pta.

## 2014-08-18 NOTE — ED Provider Notes (Signed)
CSN: [id number]     Arrival date & time 08/18/14  1935 History   First MD Initiated Contact with Patient 08/18/14 1955     Chief Complaint  Patient presents with  . Fall     (Consider location/radiation/quality/duration/timing/severity/associated sxs/prior Treatment) Patient is a 81 y.o. male presenting with fall. The history is provided by the patient. No language interpreter was used.  Fall This is a new problem. The current episode started today. Associated symptoms include arthralgias and chest pain. Associated symptoms comments: Left shoulder pain  Left lateral chest wall pain. He has tried immobilization for the symptoms. The treatment provided mild relief.    Past Medical History  Diagnosis Date  . Diabetes mellitus without complication   . Hypertension   . Cancer     rectal   Past Surgical History  Procedure Laterality Date  . Cholecystectomy    . Back surgery    . Colostomy     History reviewed. No pertinent family history. History  Substance Use Topics  . Smoking status: Never Smoker   . Smokeless tobacco: Not on file  . Alcohol Use: No    Review of Systems  Cardiovascular: Positive for chest pain.  Musculoskeletal: Positive for arthralgias.  All other systems reviewed and are negative.     Allergies  Sulfa antibiotics  Home Medications   Prior to Admission medications   Not on File   BP 144/81 mmHg  Pulse 86  Temp(Src) 97.6 F (36.4 C) (Oral)  Resp 13  SpO2 98% Physical Exam  Constitutional: He is oriented to person, place, and time. He appears well-developed and well-nourished.  HENT:  Head: Normocephalic and atraumatic.  Eyes: Pupils are equal, round, and reactive to light.  Neck: Normal range of motion. Neck supple.  Cardiovascular: Normal rate and regular rhythm.   Pulmonary/Chest: Effort normal and breath sounds normal.  Abdominal: Soft. Bowel sounds are normal.  Musculoskeletal: He exhibits edema and tenderness.       Left  shoulder: He exhibits decreased range of motion, tenderness and swelling.       Arms: Lymphadenopathy:    He has no cervical adenopathy.  Neurological: He is alert and oriented to person, place, and time.  Skin: Skin is warm and dry.  Psychiatric: He has a normal mood and affect.  Nursing note and vitals reviewed.   ED Course  Procedures (including critical care time) Labs Review Labs Reviewed - No data to display  Imaging Review No results found.   EKG Interpretation None     Patient with mechanical fall, landing on left arm and shoulder.  Obvious swellling/deformity noted to left upper arm/ac joint.  Distal pulses/sensation intact.  Also reporting left lateral chest wall pain, increased with palpation and inspiration.    Radiology results reviewed, shared with patient and family. Proximal left humerus fracture.  No rib fractures.  Patient discussed with and seen by Dr. Darl Householder.  Shoulder immobilizer, analgesic, orthopedic follow-up. MDM   Final diagnoses:  None    Proximal left humeral fracture s/p mechanical fall.    Norman Herrlich, NP 08/19/14 0012  Wandra Arthurs, MD 08/21/14 [phone]

## 2014-08-21 ENCOUNTER — Ambulatory Visit (INDEPENDENT_AMBULATORY_CARE_PROVIDER_SITE_OTHER): Payer: Medicare HMO | Admitting: Orthopedic Surgery

## 2014-08-21 ENCOUNTER — Encounter: Payer: Self-pay | Admitting: Orthopedic Surgery

## 2014-08-21 VITALS — BP 120/81 | Ht 68.5 in | Wt 175.0 lb

## 2014-08-21 DIAGNOSIS — S42202A Unspecified fracture of upper end of left humerus, initial encounter for closed fracture: Secondary | ICD-10-CM

## 2014-08-21 LAB — CBG MONITORING, ED: GLUCOSE-CAPILLARY: 178 mg/dL — AB (ref 70–99)

## 2014-08-21 MED ORDER — TRAMADOL HCL 50 MG PO TABS: 50.0000 mg | ORAL_TABLET | ORAL | Status: DC | PRN

## 2014-08-21 MED ORDER — HYDROCODONE-ACETAMINOPHEN 5-325 MG PO TABS: 1.0000 | ORAL_TABLET | Freq: Four times a day (QID) | ORAL | Status: DC | PRN

## 2014-08-21 MED ORDER — PROMETHAZINE HCL 25 MG PO TABS: 25.0000 mg | ORAL_TABLET | Freq: Four times a day (QID) | ORAL | Status: DC | PRN

## 2014-08-21 NOTE — Progress Notes (Signed)
Patient ID: Ralph Yang, male   DOB: 02-23-1934, 81 y.o.   MRN: [id number]  Chief Complaint  Patient presents with  . Follow-up    er follow up, left shoulder fx, DOI 08/18/14 (FALL)    HPI Ralph Yang is a 81 y.o. male.  [place] on January 29 slipped in his yard and injured his left shoulder. He is left-hand dominant. He complains of pain but he only takes tramadol 2 tablets as needed and he is in a shoulder immobilizer. Pain location is over the left proximal humerus since dull throbbing sometimes stabbing moderate in severity and constant worse with any type of movement of his left arm   HPI  Review of Systems Review of Systems Negative review of systems are noted with the exceptions of the pain and discomfort in his left arm and shoulder.  Past Medical History  Diagnosis Date  . Diabetes mellitus without complication   . Hypertension   . Cancer     rectal    Past Surgical History  Procedure Laterality Date  . Cholecystectomy    . Back surgery    . Colostomy      No family history on file.  Social History History  Substance Use Topics  . Smoking status: Never Smoker   . Smokeless tobacco: Not on file  . Alcohol Use: No    Allergies  Allergen Reactions  . Sulfa Antibiotics Nausea Only    Current Outpatient Prescriptions  Medication Sig Dispense Refill  . GLIPIZIDE-METFORMIN HCL PO Take by mouth.    . Ibuprofen 200 MG CAPS Take by mouth.    Marland Kitchen LISINOPRIL PO Take by mouth.    Marland Kitchen PRAVASTATIN SODIUM PO Take by mouth.    . traMADol (ULTRAM) 50 MG tablet Take 1 tablet (50 mg total) by mouth every 6 (six) hours as needed. 15 tablet 0  . HYDROcodone-acetaminophen (NORCO/VICODIN) 5-325 MG per tablet Take 1 tablet by mouth every 6 (six) hours as needed for severe pain. (Patient not taking: Reported on 08/21/2014) 10 tablet 0   No current facility-administered medications for this visit.       Physical Exam Blood pressure 120/81, height 5' 8.5" (1.74 m), weight  175 lb (79.379 kg).  Patient appears to be in fairly good shape his body habitus is normal he is oriented 3 his mood is flat his affect is flat he has a cane to help him walk he has balance issues. Physical Exam  Lymphadenopathy:       Left cervical: No superficial cervical and no deep cervical adenopathy present.    He has no axillary adenopathy.   Right Shoulder Exam  Right shoulder exam is normal.   Left Shoulder Exam   Tenderness  Left shoulder tenderness location: Swelling and tenderness proximal humerus.  Range of Motion  Left shoulder active abduction: Could not assess his range of motion in any plane.   Muscle Strength  Left shoulder normal muscle strength: Muscle tone was normal and elbow wrist and hand motion and strength were normal.  Other  Erythema: absent Scars: absent Sensation: normal Pulse: present   Comments:  We cannot test the stability either.  His neck seems nontender no swelling.     data reviewed       Hospital films show the head is basically fallen off the humerus  assessment left proximal humerus fracture  Plan  I repositioned his immobilizer and gave him an option of surgical versus nonsurgical treatment with the understanding that  if he does not have surgery he will have limited motion of his left shoulder. Based on his health and desire not to have to undergo surgical procedures opt for nonoperative treatment we recommend x-ray in 3 weeks Sling and immobilizer for 3 weeks

## 2014-09-12 ENCOUNTER — Ambulatory Visit (INDEPENDENT_AMBULATORY_CARE_PROVIDER_SITE_OTHER): Payer: Medicare HMO

## 2014-09-12 ENCOUNTER — Encounter: Payer: Self-pay | Admitting: Orthopedic Surgery

## 2014-09-12 ENCOUNTER — Ambulatory Visit (INDEPENDENT_AMBULATORY_CARE_PROVIDER_SITE_OTHER): Payer: Self-pay | Admitting: Orthopedic Surgery

## 2014-09-12 VITALS — BP 134/81 | Ht 68.5 in | Wt 175.0 lb

## 2014-09-12 DIAGNOSIS — S4292XD Fracture of left shoulder girdle, part unspecified, subsequent encounter for fracture with routine healing: Secondary | ICD-10-CM

## 2014-09-12 MED ORDER — HYDROCODONE-ACETAMINOPHEN 5-325 MG PO TABS: 1.0000 | ORAL_TABLET | Freq: Four times a day (QID) | ORAL | Status: DC | PRN

## 2014-09-12 NOTE — Progress Notes (Signed)
Chief Complaint  Patient presents with  . Follow-up    3 week recheck on left shoulder fracture with xray, DOI 08-18-14.   Encounter Diagnosis  Name Primary?  . Shoulder fracture, left, with routine healing, subsequent encounter Yes    BP 134/81 mmHg  Ht 5' 8.5" (1.74 m)  Wt 175 lb (79.379 kg)  BMI 26.22 kg/m2  X-rays left shoulder proximal humerus fracture with greater tuberosity fragment  As noted prior patient was advised to the fact that he would have residual deformity and motion limits without surgery but based on his medical condition age etc. preoperative for nonoperative treatment. Today's x-ray shows fracture is in stable alignment varus with a greater tuberosity fragment displaced  However his tenderness is much better his skin is still ecchymotic but no evidence of skin laceration or break through  Recommend physical therapy follow-up 8 weeks  Meds ordered this encounter  Medications  . DISCONTD: HYDROcodone-acetaminophen (NORCO/VICODIN) 5-325 MG per tablet    Sig: Take 1 tablet by mouth every 6 (six) hours as needed for moderate pain.    Dispense:  60 tablet    Refill:  0  . HYDROcodone-acetaminophen (NORCO/VICODIN) 5-325 MG per tablet    Sig: Take 1 tablet by mouth every 6 (six) hours as needed for moderate pain.    Dispense:  60 tablet    Refill:  0

## 2014-09-12 NOTE — Patient Instructions (Signed)
Start PT at home 

## 2014-09-14 ENCOUNTER — Other Ambulatory Visit: Payer: Self-pay | Admitting: *Deleted

## 2014-09-14 DIAGNOSIS — S4292XD Fracture of left shoulder girdle, part unspecified, subsequent encounter for fracture with routine healing: Secondary | ICD-10-CM

## 2014-09-18 ENCOUNTER — Telehealth: Payer: Self-pay | Admitting: Orthopedic Surgery

## 2014-09-18 NOTE — Telephone Encounter (Signed)
Patient is calling asking about [facility] for his arm and patient has not heard anything, please advise?

## 2014-09-18 NOTE — Telephone Encounter (Signed)
Advised patient that home health agency [facility]) advised me this morning that they did not accept his insurance but would cross refer to [facility] and to be expecting a call from [facility]

## 2014-11-01 ENCOUNTER — Telehealth: Payer: Self-pay | Admitting: Orthopedic Surgery

## 2014-11-01 ENCOUNTER — Other Ambulatory Visit: Payer: Self-pay | Admitting: *Deleted

## 2014-11-01 MED ORDER — TRAMADOL HCL 50 MG PO TABS: 50.0000 mg | ORAL_TABLET | ORAL | Status: DC | PRN

## 2014-11-01 MED ORDER — HYDROCODONE-ACETAMINOPHEN 5-325 MG PO TABS: 1.0000 | ORAL_TABLET | Freq: Four times a day (QID) | ORAL | Status: DC | PRN

## 2014-11-01 NOTE — Telephone Encounter (Signed)
REFILL REQUEST FROM PHARMACY IS FOR TRAMADOL 50  PRINTED FOR MD TO SIGN

## 2014-11-01 NOTE — Telephone Encounter (Signed)
Patient is calling requesting a refill on pain medication HYDROcodone-acetaminophen (NORCO/VICODIN) 5-325 MG per tablet  Please advise?

## 2014-11-14 ENCOUNTER — Encounter: Payer: Self-pay | Admitting: Orthopedic Surgery

## 2014-11-14 ENCOUNTER — Ambulatory Visit (INDEPENDENT_AMBULATORY_CARE_PROVIDER_SITE_OTHER): Payer: Self-pay | Admitting: Orthopedic Surgery

## 2014-11-14 VITALS — BP 104/65 | Ht 68.5 in | Wt 175.0 lb

## 2014-11-14 DIAGNOSIS — S4292XD Fracture of left shoulder girdle, part unspecified, subsequent encounter for fracture with routine healing: Secondary | ICD-10-CM

## 2014-11-14 NOTE — Progress Notes (Signed)
Patient ID: Ralph Yang, male   DOB: Feb 24, 1934, 81 y.o.   MRN: [id number] Left proximal humerus fracture patient did not want therapy but says he is back to his normal ADLs. Despite some limitations in his overall range of motion is pain-free with a stable fracture proximally and normal function he is released.

## 2015-05-11 ENCOUNTER — Emergency Department (HOSPITAL_COMMUNITY)
Admission: EM | Admit: 2015-05-11 | Discharge: 2015-05-11 | Disposition: A | Discharge status: Home / Self Care | Payer: Medicare HMO | Attending: Emergency Medicine | Admitting: Emergency Medicine

## 2015-05-11 ENCOUNTER — Emergency Department (HOSPITAL_COMMUNITY): Payer: Medicare HMO

## 2015-05-11 ENCOUNTER — Encounter (HOSPITAL_COMMUNITY): Payer: Self-pay | Admitting: Emergency Medicine

## 2015-05-11 DIAGNOSIS — I10 Essential (primary) hypertension: Secondary | ICD-10-CM | POA: Insufficient documentation

## 2015-05-11 DIAGNOSIS — M1612 Unilateral primary osteoarthritis, left hip: Secondary | ICD-10-CM | POA: Diagnosis not present

## 2015-05-11 DIAGNOSIS — M545 Low back pain: Secondary | ICD-10-CM

## 2015-05-11 DIAGNOSIS — M25552 Pain in left hip: Secondary | ICD-10-CM | POA: Diagnosis not present

## 2015-05-11 DIAGNOSIS — E119 Type 2 diabetes mellitus without complications: Secondary | ICD-10-CM | POA: Insufficient documentation

## 2015-05-11 DIAGNOSIS — M13852 Other specified arthritis, left hip: Secondary | ICD-10-CM | POA: Diagnosis not present

## 2015-05-11 DIAGNOSIS — Z85048 Personal history of other malignant neoplasm of rectum, rectosigmoid junction, and anus: Secondary | ICD-10-CM | POA: Diagnosis not present

## 2015-05-11 DIAGNOSIS — M5136 Other intervertebral disc degeneration, lumbar region: Secondary | ICD-10-CM | POA: Diagnosis not present

## 2015-05-11 HISTORY — DX: Unspecified osteoarthritis, unspecified site: M19.90

## 2015-05-11 LAB — I-STAT CHEM 8, ED
BUN: 14 mg/dL (ref 6–20)
CALCIUM ION: 1.18 mmol/L (ref 1.13–1.30)
CREATININE: 0.9 mg/dL (ref 0.61–1.24)
Chloride: 100 mmol/L — ABNORMAL LOW (ref 101–111)
GLUCOSE: 199 mg/dL — AB (ref 65–99)
HCT: 37 % — ABNORMAL LOW (ref 39.0–52.0)
HEMOGLOBIN: 12.6 g/dL — AB (ref 13.0–17.0)
Potassium: 4.4 mmol/L (ref 3.5–5.1)
Sodium: 136 mmol/L (ref 135–145)
TCO2: 24 mmol/L (ref 0–100)

## 2015-05-11 MED ORDER — MORPHINE SULFATE (PF) 4 MG/ML IV SOLN
4.0000 mg | Freq: Once | INTRAVENOUS | Status: AC
  Administered 2015-05-11: 4 mg via INTRAVENOUS
  Filled 2015-05-11: qty 1

## 2015-05-11 MED ORDER — OXYCODONE-ACETAMINOPHEN 5-325 MG PO TABS
1.0000 | ORAL_TABLET | Freq: Three times a day (TID) | ORAL | Status: DC | PRN
Start: 2015-05-11 — End: 2015-05-24

## 2015-05-11 NOTE — Care Management (Signed)
CM asked by MD to see pt about HH needs. Pt lives with his wife and is ind at baseline. Pt has arthritis in his hip has been getting worse. Pt recently discharged from [facility] PT services through [facility]. Pt says he continues to perform PT exercises daily. Pt has walker and wheelchair at home. Pt is not interested in [facility] services as he is already doing therapy by himself. Pt's wife requesting DC at this time. No CM needs identified.

## 2015-05-11 NOTE — ED Notes (Signed)
Patient transported to X-ray 

## 2015-05-11 NOTE — Discharge Instructions (Signed)

## 2015-05-11 NOTE — ED Notes (Signed)
MD at bedside. 

## 2015-05-11 NOTE — ED Provider Notes (Signed)
CSN: [id number]     Arrival date & time 05/11/15  1022 History  By signing my name below, I, Meriel Pica, attest that this documentation has been prepared under the direction and in the presence of Ripley Fraise, MD. Electronically Signed: Meriel Pica, ED Scribe. 05/11/2015. 11:04 AM.   Chief Complaint  Patient presents with  . Hip Pain    Patient is a 81 y.o. male presenting with hip pain. The history is provided by the patient and the spouse. No language interpreter was used.  Hip Pain This is a chronic problem. The current episode started 3 to 5 hours ago. The problem occurs constantly. The problem has been rapidly worsening. Pertinent negatives include no chest pain and no shortness of breath. The symptoms are aggravated by walking. Nothing relieves the symptoms. He has tried nothing for the symptoms. The treatment provided no relief.   HPI Comments: Ralph Yang is a 81 y.o. male, with a PMhx of DM, HTN, and osteoarthritis, who presents via ambulance to the Emergency Department complaining of an exacerbation of chronic left hip pain that suddenly worsened this morning. He associates lower left back pain. His pain is exacerbated with weight bearing and pt states his legs feel heavy with ambulation. Per wife, the pt was unable to ambulate this morning due to worsening pain, prompting EMS call out. No recent falls or injuries attributable to the pain. Denies groin or testicular pain, CP, SOB, and weakness or numbness in BLE. No PShx to left hip.   Past Medical History  Diagnosis Date  . Diabetes mellitus without complication ([place])   . Hypertension   . Cancer [facility])     rectal  . Arthritis    Past Surgical History  Procedure Laterality Date  . Cholecystectomy    . Back surgery    . Colostomy     No family history on file. Social History  Substance Use Topics  . Smoking status: Never Smoker   . Smokeless tobacco: Not on file  . Alcohol Use: No    Review of Systems   Constitutional: Negative for fever.  Respiratory: Negative for shortness of breath.   Cardiovascular: Negative for chest pain.  Genitourinary: Negative for testicular pain.  Musculoskeletal: Positive for back pain ( left lower ) and arthralgias ( left hip).  Neurological: Negative for weakness and numbness.  All other systems reviewed and are negative.  Allergies  Sulfa antibiotics  Home Medications   Prior to Admission medications   Medication Sig Start Date End Date Taking? Authorizing Provider  GLIPIZIDE-METFORMIN HCL PO Take by mouth.    Historical Provider, MD  Ibuprofen 200 MG CAPS Take by mouth.    Historical Provider, MD  LISINOPRIL PO Take by mouth.    Historical Provider, MD  PRAVASTATIN SODIUM PO Take by mouth.    Historical Provider, MD  traMADol (ULTRAM) 50 MG tablet Take 1 tablet (50 mg total) by mouth every 4 (four) hours as needed. 11/01/14   Carole Civil, MD   BP 151/79 mmHg  Pulse 80  Temp(Src) 98.1 F (36.7 C) (Oral)  Resp 18  Ht 5\' 8"  (1.727 m)  Wt 165 lb (74.844 kg)  BMI 25.09 kg/m2  SpO2 97% Physical Exam Nursing notes including past medical history and social history reviewed and considered in documentation CONSTITUTIONAL: Well developed/well nourished HEAD: Normocephalic/atraumatic EYES: EOMI/PERRL ENMT: Mucous membranes moist NECK: supple no meningeal signs SPINE/BACK:entire spine nontender, lumbar paraspinal tenderness, No bruising/crepitance/stepoffs noted to spine CV: S1/S2 noted, no  murmurs/rubs/gallops noted LUNGS: Lungs are clear to auscultation bilaterally, no apparent distress ABDOMEN: soft, nontender, no rebound or guarding, bowel sounds noted throughout abdomen, colostomy in place GU:no cva tenderness NEURO: Pt is awake/alert/appropriate, moves all extremitiesx4.   EXTREMITIES: pulses normal/equal, full ROM, TTP of left hip, pt with tenderness with ROM of left hip, no deformity noted, no bruising noted SKIN: warm, color  normal PSYCH: no abnormalities of mood noted, alert and oriented to situation  ED Course  Procedures  DIAGNOSTIC STUDIES: Oxygen Saturation is 97% on RA, normal by my interpretation.    COORDINATION OF CARE: 11:01 AM Discussed treatment plan with pt at bedside and pt agreed to plan. Will order IV pain management and Xrays of left hip and lumbar spine. Will also order diagnostic labs.   2:49 PM Pt with severe left hip arthritis No acute fx by xray Pt with some pain relief here I consulted case management for home health but he does not qualify Pt prefers to go home Given pain meds and advised to see ortho next week  Labs Review Labs Reviewed  I-STAT CHEM 8, ED - Abnormal; Notable for the following:    Chloride 100 (*)    Glucose, Bld 199 (*)    Hemoglobin 12.6 (*)    HCT 37.0 (*)    All other components within normal limits    Imaging Review Dg Lumbar Spine Complete  05/11/2015  CLINICAL DATA:  81 year old male with generalized left hip joint pain extending to the need for the past 4 days. No known injury. EXAM: LUMBAR SPINE - COMPLETE 4+ VIEW COMPARISON:  Concurrently obtained radiographs of the pelvis and left hip FINDINGS: Levoconvex and rotary scoliosis centered at L2. Multilevel degenerative disc disease with disc space narrowing, inter disc calcification and degenerative osteophyte formation. The bones appear diffusely demineralized. There is no evidence of acute fracture or malalignment. Left hip joint degenerative osteoarthritis. Surgical clips project over the anatomic pelvis. The bowel gas pattern is unremarkable. IMPRESSION: 1. No acute fracture or malalignment. 2. Multilevel degenerative disc disease and degenerative endplate spurring. 3. Levoconvex and rotary scoliosis centered at L2. 4. The bones appear diffusely demineralized. 5. Incompletely imaged left hip joint degenerative osteoarthritis. Electronically Signed   By: Jacqulynn Cadet M.D.   On: 05/11/2015 12:31    Dg Hip Unilat With Pelvis 2-3 Views Left  05/11/2015  CLINICAL DATA:  Generalized left hip pain EXAM: DG HIP (WITH OR WITHOUT PELVIS) 2-3V LEFT COMPARISON:  None. FINDINGS: No fracture or dislocation. No lytic or sclerotic osseous lesion. Mild osteoarthritis of the right hip. Severe osteoarthritis of the left hip with predominantly axial joint space narrowing. Mild degenerative changes of the sacroiliac joints. Generalized osteopenia. Partially visualized is lower lumbar spine spondylosis. IMPRESSION: 1. Severe osteoarthritis of the left hip. 2. Mild osteoarthritis of the right hip. Electronically Signed   By: Kathreen Devoid   On: 05/11/2015 12:35   I have personally reviewed and evaluated these images and lab results as part of my medical decision-making.   MDM   Final diagnoses:  Arthritis of left hip  Low back pain without sciatica, unspecified back pain laterality    Nursing notes including past medical history and social history reviewed and considered in documentation xrays/imaging reviewed by myself and considered during evaluation .Labs/vital reviewed myself and considered during evaluation   I, Sharyon Cable, personally performed the services described in this documentation. All medical record entries made by the scribe were at my direction and in my presence.  I have reviewed the chart and discharge instructions and agree that the record reflects my personal performance and is accurate and complete. Sharyon Cable.  05/11/2015. 2:46 PM.        Ripley Fraise, MD 05/11/15 [phone]

## 2015-05-11 NOTE — ED Notes (Signed)
Pt states that he has osteoarthritis in his left hip and it is hurting worse than normal with no injury.

## 2015-05-24 ENCOUNTER — Encounter: Payer: Self-pay | Admitting: Orthopedic Surgery

## 2015-05-24 ENCOUNTER — Ambulatory Visit (INDEPENDENT_AMBULATORY_CARE_PROVIDER_SITE_OTHER): Payer: Medicare HMO | Admitting: Orthopedic Surgery

## 2015-05-24 VITALS — BP 138/81 | Ht 68.0 in | Wt 165.0 lb

## 2015-05-24 DIAGNOSIS — M4806 Spinal stenosis, lumbar region: Secondary | ICD-10-CM | POA: Diagnosis not present

## 2015-05-24 DIAGNOSIS — M1612 Unilateral primary osteoarthritis, left hip: Secondary | ICD-10-CM | POA: Diagnosis not present

## 2015-05-24 DIAGNOSIS — M48061 Spinal stenosis, lumbar region without neurogenic claudication: Secondary | ICD-10-CM

## 2015-05-24 NOTE — Patient Instructions (Signed)
We will schedule MRI for and call you with appointment  Start using walker  We will refer back to Dr Durene Cal once we have MRI results

## 2015-05-24 NOTE — Progress Notes (Signed)
   Subjective:   Chief Complaint  Patient presents with  . Follow-up     ER follow up left hip pain, no known injury     Patient ID: Ralph Yang, male    DOB: 1934-03-07, 81 y.o.   MRN: [id number]  HPI  This patient is an established patient presents with a new problem.  He is status post 2 surgeries by Dr. Sherwood Gambler in [place]. He presents with a history of frequent falls and giving way symptoms of both lower extremities with left sided back pain radiating down his left leg. He has some mild anterior thigh pain and mild groin pain. His wife says it's gotten to the point where he almost can't walk at all. He was cut currently ambulating with a cane but he had to go the emergency room him up or move around. He is back pain is moderate to severe it is a dull aching sensation running from the lower back down to the left leg and thigh. This appears be worsened by St. standing for long periods or sitting for long periods  He also has a recent colostomy bag which is permanent secondary to cancer  Review of Systems Bowel symptoms cannot determine at this point because of the colostomy urinary symptoms no changes No fever no chills     Objective: BP 138/81 mmHg  Ht 5\' 8"  (1.727 m)  Wt 165 lb (74.844 kg)  BMI 25.09 kg/m2     Physical Exam  Constitutional: He is oriented to person, place, and time. He appears well-developed. No distress.  Cardiovascular: Intact distal pulses.   Both legs   Musculoskeletal:  He requires a cane to walk he walks with a scoliosis in his lower back flexed posture at the lumbopelvic junction  Neurological: He is alert and oriented to person, place, and time.  Skin: Skin is warm. He is diaphoretic.  Both legs   Psychiatric: He has a normal mood and affect.   His leg lengths show slight leg length shortness on the left. His hip flexion does not produce any pain in either leg although he has normal hip flexion right and left knee has no muscle atrophy in  either leg. He has good reflexes at the knee 1+ at the ankle. He has no sensory deficit in either foot has good distal pulses as described. He has tenderness in the lumbar spine. Has a scoliosis. His skin is devoid of any stiff skin lesions.  Straight leg raise negative.        Assessment & Plan:   Encounter Diagnoses  Name Primary?  . Spinal stenosis of lumbar region Yes  . Primary osteoarthritis of left hip     He has hip arthritis on the left but is not causing him to not be L to walk  He has a scoliosis with spinal stenosis and his gait has deteriorated rapidly  Recommend MRI and then referral to Dr. Sherwood Gambler for neurosurgical evaluation. I don't think he can have a hip replacement because of his colostomy and I would not pursue this further without consultation with the hip reconstruction specialist

## 2015-06-05 DIAGNOSIS — Z125 Encounter for screening for malignant neoplasm of prostate: Secondary | ICD-10-CM | POA: Diagnosis not present

## 2015-06-05 DIAGNOSIS — D519 Vitamin B12 deficiency anemia, unspecified: Secondary | ICD-10-CM | POA: Diagnosis not present

## 2015-06-05 DIAGNOSIS — E119 Type 2 diabetes mellitus without complications: Secondary | ICD-10-CM | POA: Diagnosis not present

## 2015-06-07 DIAGNOSIS — I10 Essential (primary) hypertension: Secondary | ICD-10-CM | POA: Diagnosis not present

## 2015-06-07 DIAGNOSIS — M545 Low back pain: Secondary | ICD-10-CM | POA: Diagnosis not present

## 2015-06-07 DIAGNOSIS — E782 Mixed hyperlipidemia: Secondary | ICD-10-CM | POA: Diagnosis not present

## 2015-06-07 DIAGNOSIS — E119 Type 2 diabetes mellitus without complications: Secondary | ICD-10-CM | POA: Diagnosis not present

## 2015-06-07 DIAGNOSIS — D519 Vitamin B12 deficiency anemia, unspecified: Secondary | ICD-10-CM | POA: Diagnosis not present

## 2015-06-07 DIAGNOSIS — D509 Iron deficiency anemia, unspecified: Secondary | ICD-10-CM | POA: Diagnosis not present

## 2015-06-28 ENCOUNTER — Telehealth: Payer: Self-pay | Admitting: *Deleted

## 2015-06-28 NOTE — Telephone Encounter (Signed)
Spoke with patient and advised MRI is in review with his insurance company

## 2015-06-28 NOTE — Telephone Encounter (Signed)
Patient called stating he is returning a call to someone from the office. Please advise [phone]

## 2015-06-29 DIAGNOSIS — C20 Malignant neoplasm of rectum: Secondary | ICD-10-CM | POA: Diagnosis not present

## 2015-06-29 DIAGNOSIS — Z933 Colostomy status: Secondary | ICD-10-CM | POA: Diagnosis not present

## 2015-07-02 ENCOUNTER — Other Ambulatory Visit: Payer: Self-pay | Admitting: *Deleted

## 2015-07-02 DIAGNOSIS — M1612 Unilateral primary osteoarthritis, left hip: Secondary | ICD-10-CM

## 2015-07-03 ENCOUNTER — Other Ambulatory Visit: Payer: Self-pay | Admitting: *Deleted

## 2015-07-03 DIAGNOSIS — M48061 Spinal stenosis, lumbar region without neurogenic claudication: Secondary | ICD-10-CM

## 2015-07-03 DIAGNOSIS — M1612 Unilateral primary osteoarthritis, left hip: Secondary | ICD-10-CM

## 2015-07-04 DIAGNOSIS — M1612 Unilateral primary osteoarthritis, left hip: Secondary | ICD-10-CM | POA: Diagnosis not present

## 2015-07-04 DIAGNOSIS — Z933 Colostomy status: Secondary | ICD-10-CM | POA: Diagnosis not present

## 2015-07-04 DIAGNOSIS — M4806 Spinal stenosis, lumbar region: Secondary | ICD-10-CM | POA: Diagnosis not present

## 2015-07-05 DIAGNOSIS — Z933 Colostomy status: Secondary | ICD-10-CM | POA: Diagnosis not present

## 2015-07-05 DIAGNOSIS — M1612 Unilateral primary osteoarthritis, left hip: Secondary | ICD-10-CM | POA: Diagnosis not present

## 2015-07-05 DIAGNOSIS — M4806 Spinal stenosis, lumbar region: Secondary | ICD-10-CM | POA: Diagnosis not present

## 2015-07-10 DIAGNOSIS — M4806 Spinal stenosis, lumbar region: Secondary | ICD-10-CM | POA: Diagnosis not present

## 2015-07-10 DIAGNOSIS — M1612 Unilateral primary osteoarthritis, left hip: Secondary | ICD-10-CM | POA: Diagnosis not present

## 2015-07-10 DIAGNOSIS — Z933 Colostomy status: Secondary | ICD-10-CM | POA: Diagnosis not present

## 2015-07-12 DIAGNOSIS — M4806 Spinal stenosis, lumbar region: Secondary | ICD-10-CM | POA: Diagnosis not present

## 2015-07-12 DIAGNOSIS — M1612 Unilateral primary osteoarthritis, left hip: Secondary | ICD-10-CM | POA: Diagnosis not present

## 2015-07-12 DIAGNOSIS — Z933 Colostomy status: Secondary | ICD-10-CM | POA: Diagnosis not present

## 2015-07-17 DIAGNOSIS — M1612 Unilateral primary osteoarthritis, left hip: Secondary | ICD-10-CM | POA: Diagnosis not present

## 2015-07-17 DIAGNOSIS — Z933 Colostomy status: Secondary | ICD-10-CM | POA: Diagnosis not present

## 2015-07-17 DIAGNOSIS — M4806 Spinal stenosis, lumbar region: Secondary | ICD-10-CM | POA: Diagnosis not present

## 2015-07-19 DIAGNOSIS — M4806 Spinal stenosis, lumbar region: Secondary | ICD-10-CM | POA: Diagnosis not present

## 2015-07-19 DIAGNOSIS — Z933 Colostomy status: Secondary | ICD-10-CM | POA: Diagnosis not present

## 2015-07-19 DIAGNOSIS — M1612 Unilateral primary osteoarthritis, left hip: Secondary | ICD-10-CM | POA: Diagnosis not present

## 2015-07-26 DIAGNOSIS — Z933 Colostomy status: Secondary | ICD-10-CM | POA: Diagnosis not present

## 2015-07-26 DIAGNOSIS — C20 Malignant neoplasm of rectum: Secondary | ICD-10-CM | POA: Diagnosis not present

## 2015-08-27 DIAGNOSIS — R69 Illness, unspecified: Secondary | ICD-10-CM | POA: Diagnosis not present

## 2015-10-10 DIAGNOSIS — E119 Type 2 diabetes mellitus without complications: Secondary | ICD-10-CM | POA: Diagnosis not present

## 2015-10-10 DIAGNOSIS — D519 Vitamin B12 deficiency anemia, unspecified: Secondary | ICD-10-CM | POA: Diagnosis not present

## 2015-10-15 DIAGNOSIS — I10 Essential (primary) hypertension: Secondary | ICD-10-CM | POA: Diagnosis not present

## 2015-10-15 DIAGNOSIS — D519 Vitamin B12 deficiency anemia, unspecified: Secondary | ICD-10-CM | POA: Diagnosis not present

## 2015-10-15 DIAGNOSIS — E119 Type 2 diabetes mellitus without complications: Secondary | ICD-10-CM | POA: Diagnosis not present

## 2015-10-15 DIAGNOSIS — D509 Iron deficiency anemia, unspecified: Secondary | ICD-10-CM | POA: Diagnosis not present

## 2015-10-17 DIAGNOSIS — R69 Illness, unspecified: Secondary | ICD-10-CM | POA: Diagnosis not present

## 2015-10-29 DIAGNOSIS — Z933 Colostomy status: Secondary | ICD-10-CM | POA: Diagnosis not present

## 2015-10-29 DIAGNOSIS — C20 Malignant neoplasm of rectum: Secondary | ICD-10-CM | POA: Diagnosis not present

## 2015-12-03 DIAGNOSIS — R69 Illness, unspecified: Secondary | ICD-10-CM | POA: Diagnosis not present

## 2015-12-28 DIAGNOSIS — C20 Malignant neoplasm of rectum: Secondary | ICD-10-CM | POA: Diagnosis not present

## 2015-12-28 DIAGNOSIS — Z933 Colostomy status: Secondary | ICD-10-CM | POA: Diagnosis not present

## 2016-01-24 DIAGNOSIS — R69 Illness, unspecified: Secondary | ICD-10-CM | POA: Diagnosis not present

## 2016-01-30 DIAGNOSIS — M175 Other unilateral secondary osteoarthritis of knee: Secondary | ICD-10-CM | POA: Diagnosis not present

## 2016-02-04 DIAGNOSIS — M175 Other unilateral secondary osteoarthritis of knee: Secondary | ICD-10-CM | POA: Diagnosis not present

## 2016-03-26 DIAGNOSIS — D509 Iron deficiency anemia, unspecified: Secondary | ICD-10-CM | POA: Diagnosis not present

## 2016-03-26 DIAGNOSIS — D519 Vitamin B12 deficiency anemia, unspecified: Secondary | ICD-10-CM | POA: Diagnosis not present

## 2016-03-26 DIAGNOSIS — E782 Mixed hyperlipidemia: Secondary | ICD-10-CM | POA: Diagnosis not present

## 2016-03-26 DIAGNOSIS — Z933 Colostomy status: Secondary | ICD-10-CM | POA: Diagnosis not present

## 2016-03-26 DIAGNOSIS — C20 Malignant neoplasm of rectum: Secondary | ICD-10-CM | POA: Diagnosis not present

## 2016-03-26 DIAGNOSIS — E119 Type 2 diabetes mellitus without complications: Secondary | ICD-10-CM | POA: Diagnosis not present

## 2016-03-28 DIAGNOSIS — D509 Iron deficiency anemia, unspecified: Secondary | ICD-10-CM | POA: Diagnosis not present

## 2016-03-28 DIAGNOSIS — D519 Vitamin B12 deficiency anemia, unspecified: Secondary | ICD-10-CM | POA: Diagnosis not present

## 2016-03-28 DIAGNOSIS — E782 Mixed hyperlipidemia: Secondary | ICD-10-CM | POA: Diagnosis not present

## 2016-03-28 DIAGNOSIS — M545 Low back pain: Secondary | ICD-10-CM | POA: Diagnosis not present

## 2016-03-28 DIAGNOSIS — I10 Essential (primary) hypertension: Secondary | ICD-10-CM | POA: Diagnosis not present

## 2016-03-28 DIAGNOSIS — E119 Type 2 diabetes mellitus without complications: Secondary | ICD-10-CM | POA: Diagnosis not present

## 2016-04-07 DIAGNOSIS — Z0000 Encounter for general adult medical examination without abnormal findings: Secondary | ICD-10-CM | POA: Diagnosis not present

## 2016-04-07 DIAGNOSIS — E119 Type 2 diabetes mellitus without complications: Secondary | ICD-10-CM | POA: Diagnosis not present

## 2016-04-07 DIAGNOSIS — E785 Hyperlipidemia, unspecified: Secondary | ICD-10-CM | POA: Diagnosis not present

## 2016-04-07 DIAGNOSIS — Z933 Colostomy status: Secondary | ICD-10-CM | POA: Diagnosis not present

## 2016-04-23 DIAGNOSIS — R69 Illness, unspecified: Secondary | ICD-10-CM | POA: Diagnosis not present

## 2016-05-23 DIAGNOSIS — Z933 Colostomy status: Secondary | ICD-10-CM | POA: Diagnosis not present

## 2016-05-23 DIAGNOSIS — C20 Malignant neoplasm of rectum: Secondary | ICD-10-CM | POA: Diagnosis not present

## 2016-06-04 DIAGNOSIS — R69 Illness, unspecified: Secondary | ICD-10-CM | POA: Diagnosis not present

## 2016-06-24 ENCOUNTER — Encounter (HOSPITAL_COMMUNITY): Payer: Self-pay | Admitting: *Deleted

## 2016-06-24 ENCOUNTER — Emergency Department (HOSPITAL_COMMUNITY): Payer: Medicare HMO

## 2016-06-24 ENCOUNTER — Emergency Department (HOSPITAL_COMMUNITY)
Admission: EM | Admit: 2016-06-24 | Discharge: 2016-06-24 | Disposition: A | Discharge status: Home / Self Care | Payer: Medicare HMO | Attending: Emergency Medicine | Admitting: Emergency Medicine

## 2016-06-24 DIAGNOSIS — S52501A Unspecified fracture of the lower end of right radius, initial encounter for closed fracture: Secondary | ICD-10-CM | POA: Diagnosis not present

## 2016-06-24 DIAGNOSIS — Y999 Unspecified external cause status: Secondary | ICD-10-CM | POA: Diagnosis not present

## 2016-06-24 DIAGNOSIS — E119 Type 2 diabetes mellitus without complications: Secondary | ICD-10-CM | POA: Insufficient documentation

## 2016-06-24 DIAGNOSIS — S52591A Other fractures of lower end of right radius, initial encounter for closed fracture: Secondary | ICD-10-CM | POA: Insufficient documentation

## 2016-06-24 DIAGNOSIS — W1839XA Other fall on same level, initial encounter: Secondary | ICD-10-CM | POA: Diagnosis not present

## 2016-06-24 DIAGNOSIS — S6991XA Unspecified injury of right wrist, hand and finger(s), initial encounter: Secondary | ICD-10-CM | POA: Diagnosis present

## 2016-06-24 DIAGNOSIS — Z791 Long term (current) use of non-steroidal anti-inflammatories (NSAID): Secondary | ICD-10-CM | POA: Insufficient documentation

## 2016-06-24 DIAGNOSIS — I10 Essential (primary) hypertension: Secondary | ICD-10-CM | POA: Diagnosis not present

## 2016-06-24 DIAGNOSIS — Z85048 Personal history of other malignant neoplasm of rectum, rectosigmoid junction, and anus: Secondary | ICD-10-CM | POA: Diagnosis not present

## 2016-06-24 DIAGNOSIS — Y929 Unspecified place or not applicable: Secondary | ICD-10-CM | POA: Insufficient documentation

## 2016-06-24 DIAGNOSIS — Y9389 Activity, other specified: Secondary | ICD-10-CM | POA: Diagnosis not present

## 2016-06-24 DIAGNOSIS — S52611A Displaced fracture of right ulna styloid process, initial encounter for closed fracture: Secondary | ICD-10-CM | POA: Insufficient documentation

## 2016-06-24 DIAGNOSIS — S52601A Unspecified fracture of lower end of right ulna, initial encounter for closed fracture: Secondary | ICD-10-CM | POA: Diagnosis not present

## 2016-06-24 DIAGNOSIS — Z79899 Other long term (current) drug therapy: Secondary | ICD-10-CM | POA: Diagnosis not present

## 2016-06-24 DIAGNOSIS — S52511A Displaced fracture of right radial styloid process, initial encounter for closed fracture: Secondary | ICD-10-CM | POA: Diagnosis not present

## 2016-06-24 MED ORDER — FENTANYL CITRATE (PF) 100 MCG/2ML IJ SOLN
50.0000 ug | Freq: Once | INTRAMUSCULAR | Status: AC
  Administered 2016-06-24: 50 ug via INTRAVENOUS
  Filled 2016-06-24: qty 2

## 2016-06-24 MED ORDER — HYDROCODONE-ACETAMINOPHEN 5-325 MG PO TABS: 1.0000 | ORAL_TABLET | ORAL | 0 refills | Status: DC | PRN

## 2016-06-24 MED ORDER — BUPIVACAINE HCL (PF) 0.5 % IJ SOLN
10.0000 mL | Freq: Once | INTRAMUSCULAR | Status: AC
  Administered 2016-06-24: 10 mL
  Filled 2016-06-24: qty 30

## 2016-06-24 MED ORDER — LIDOCAINE HCL (PF) 1 % IJ SOLN
5.0000 mL | Freq: Once | INTRAMUSCULAR | Status: AC
  Administered 2016-06-24: 5 mL
  Filled 2016-06-24: qty 5

## 2016-06-24 MED ORDER — PROPOFOL 10 MG/ML IV BOLUS
INTRAVENOUS | Status: AC
  Filled 2016-06-24: qty 20

## 2016-06-24 NOTE — ED Triage Notes (Addendum)
Pt reports fall today with right wrist pain. Pt's right wrist has obvious deformity. Hand is warm, capillary refill < 3 seconds. Pt denies hitting his head or LOC.

## 2016-06-24 NOTE — ED Provider Notes (Signed)
[facility] DEPT Provider Note   CSN: [id number] Arrival date & time: 06/24/16  1359     History   Chief Complaint Chief Complaint  Patient presents with  . Fall  . Wrist Pain    HPI Ralph Yang is a 82 y.o. male.  HPI Patient presents with deformity and pain to his right wrist. States he was loading groceries into the trunk of his car and stepped back and lost his balance. He tried to catch himself with his right hand. He denies any head or neck injury. Denies any numbness or focal weakness. States he was in his normal state of health this morning. No recent fever or chills. Has been ambulatory since the fall. Patient is not on any anticoagulants. Last oral intake was at 10:30 AM this morning. Past Medical History:  Diagnosis Date  . Arthritis   . Cancer ([place])    rectal  . Diabetes mellitus without complication ([place])   . Hypertension     There are no active problems to display for this patient.   Past Surgical History:  Procedure Laterality Date  . BACK SURGERY    . CHOLECYSTECTOMY    . COLOSTOMY         Home Medications    Prior to Admission medications   Medication Sig Start Date End Date Taking? Authorizing Provider  Cyanocobalamin (VITAMIN B 12 PO) Take 1 tablet by mouth daily.    Yes Historical Provider, MD  glyBURIDE-metformin (GLUCOVANCE) 5-500 MG tablet Take 1 tablet by mouth daily. 02/28/15  Yes Historical Provider, MD  ibuprofen (ADVIL,MOTRIN) 200 MG tablet Take 400 mg by mouth every 6 (six) hours as needed for mild pain.    Yes Historical Provider, MD  pravastatin (PRAVACHOL) 20 MG tablet Take 20 mg by mouth at bedtime. 05/07/15  Yes Historical Provider, MD  traMADol (ULTRAM) 50 MG tablet Take 50 mg by mouth every 6 (six) hours as needed.   Yes Historical Provider, MD  Vitamin D, Cholecalciferol, 1000 UNITS TABS Take 1,000 Units by mouth daily.   Yes Historical Provider, MD  HYDROcodone-acetaminophen (NORCO) 5-325 MG tablet Take 1 tablet by  mouth every 4 (four) hours as needed for severe pain. 06/24/16   Julianne Rice, MD    Family History No family history on file.  Social History Social History  Substance Use Topics  . Smoking status: Never Smoker  . Smokeless tobacco: Never Used  . Alcohol use No     Allergies   Sulfa antibiotics   Review of Systems Review of Systems  Constitutional: Negative for chills and fever.  HENT: Negative for facial swelling, trouble swallowing and voice change.   Eyes: Negative for visual disturbance.  Respiratory: Negative for shortness of breath.   Cardiovascular: Negative for chest pain.  Gastrointestinal: Negative for abdominal pain, nausea and vomiting.  Musculoskeletal: Positive for arthralgias and joint swelling. Negative for myalgias, neck pain and neck stiffness.  Neurological: Negative for dizziness, syncope, weakness, light-headedness, numbness and headaches.  All other systems reviewed and are negative.    Physical Exam Updated Vital Signs BP 165/86   Pulse 75   Temp 99 F (37.2 C) (Oral)   Resp 11   Ht 5\' 8"  (1.727 m)   Wt 170 lb (77.1 kg)   SpO2 96%   BMI 25.85 kg/m   Physical Exam  Constitutional: He is oriented to person, place, and time. He appears well-developed and well-nourished.  HENT:  Head: Normocephalic and atraumatic.  Mouth/Throat: Oropharynx is clear  and moist.  Eyes: EOM are normal. Pupils are equal, round, and reactive to light.  Neck: Normal range of motion. Neck supple.  No posterior midline cervical tenderness to palpation.  Cardiovascular: Normal rate and regular rhythm.  Exam reveals no gallop and no friction rub.   No murmur heard. Pulmonary/Chest: Effort normal and breath sounds normal.  Abdominal: Soft. Bowel sounds are normal. There is no tenderness. There is no rebound and no guarding.  Musculoskeletal: He exhibits edema, tenderness and deformity.  Patient with swollen, deformed right wrist. No obvious lacerations present.  Patient holds his index finger of the right hand in flexion. Unable to extend. States this is a chronic issue for him. He is able to move the other fingers freely. Good distal cap refill. Full range of motion of the right elbow and right shoulder. Full range of motion of bilateral hips without pain. Pelvis is stable. No midline thoracic or lumbar tenderness to palpation. Distal pulses intact.  Neurological: He is alert and oriented to person, place, and time.  Sensation to bilateral hands intact. Good grip strength bilaterally. 5/5 motor in all extremities.  Skin: Skin is warm and dry. Capillary refill takes less than 2 seconds. No rash noted. No erythema.  Psychiatric: He has a normal mood and affect. His behavior is normal.  Nursing note and vitals reviewed.    ED Treatments / Results  Labs (all labs ordered are listed, but only abnormal results are displayed) Labs Reviewed - No data to display  EKG  EKG Interpretation None       Radiology No results found.  Procedures Reduction of fracture Date/Time: 06/24/2016 6:48 PM Performed by: Julianne Rice Authorized by: Lita Mains, Shaquasia Caponigro  Consent: Written consent obtained. Time out: Immediately prior to procedure a "time out" was called to verify the correct patient, procedure, equipment, support staff and site/side marked as required. Local anesthesia used: yes Anesthesia: hematoma block  Anesthesia: Local anesthesia used: yes Local Anesthetic: lidocaine 1% with epinephrine and bupivacaine 0.5% without epinephrine Anesthetic total: 7 mL  Sedation: Patient sedated: no Patient tolerance: Patient tolerated the procedure well with no immediate complications    (including critical care time)  Medications Ordered in ED Medications  fentaNYL (SUBLIMAZE) injection 50 mcg (50 mcg Intravenous Given 06/24/16 1654)  bupivacaine (MARCAINE) 0.5 % injection 10 mL (10 mLs Infiltration Given by Other 06/24/16 1725)  lidocaine (PF)  (XYLOCAINE) 1 % injection 5 mL (5 mLs Infiltration Given by Other 06/24/16 1725)     Initial Impression / Assessment and Plan / ED Course  I have reviewed the triage vital signs and the nursing notes.  Pertinent labs & imaging results that were available during my care of the patient were reviewed by me and considered in my medical decision making (see chart for details).  Clinical Course    Given severity of injury will have hand surgeon review films. Will likely need to be reduced. Discussed with Dr. Aline Brochure who agrees to ED attempt to reduce. Hematoma block but no conscious sedation. Improved alignment after reduction. Placed in splint and will follow-up with Dr. Aline Brochure. Return precautions given. Final Clinical Impressions(s) / ED Diagnoses   Final diagnoses:  Closed fracture of distal end of right radius, unspecified fracture morphology, initial encounter    New Prescriptions Discharge Medication List as of 06/24/2016  8:09 PM    START taking these medications   Details  HYDROcodone-acetaminophen (NORCO) 5-325 MG tablet Take 1 tablet by mouth every 4 (four) hours as needed for  severe pain., Starting Tue 06/24/2016, Print         Julianne Rice, MD 06/28/16 [id number]

## 2016-06-24 NOTE — ED Notes (Signed)
Informed consent signed for procerdure

## 2016-06-24 NOTE — ED Notes (Signed)
Sugar tong splint applied by Dr Lita Mains

## 2016-06-27 ENCOUNTER — Ambulatory Visit (INDEPENDENT_AMBULATORY_CARE_PROVIDER_SITE_OTHER): Payer: Medicare HMO | Admitting: Orthopedic Surgery

## 2016-06-27 VITALS — BP 135/88 | HR 102 | Ht 68.0 in | Wt 175.0 lb

## 2016-06-27 DIAGNOSIS — S52531A Colles' fracture of right radius, initial encounter for closed fracture: Secondary | ICD-10-CM | POA: Diagnosis not present

## 2016-06-27 NOTE — Progress Notes (Signed)
Patient ID: Ralph Yang, male   DOB: 04/08/1934, 82 y.o.   MRN: [id number]  right wrist pain times several days, date of injury December 5  HPI Ralph Yang is a 82 y.o. male.   82 year old male fell put his hands behind him injured his right wrist sustained a closed fracture with displacement right distal radius. He had closed reduction the ER.  He presents complaining of right wrist dull aching pain which is mild for 3 days improved after reduction with slight deformity residual.   Review of Systems Review of Systems  Constitutional: Negative for fever.  Neurological: Negative for numbness.     Past Medical History:  Diagnosis Date  . Arthritis   . Cancer ([place])    rectal  . Diabetes mellitus without complication ([place])   . Hypertension     Past Surgical History:  Procedure Laterality Date  . BACK SURGERY    . CHOLECYSTECTOMY    . COLOSTOMY        Physical Exam 1 There were no vitals taken for this visit. Physical Exam 2 The patient is well developed well nourished and well groomed. 3 Orientation to person place and time is normal  4 Mood is pleasant.  5 Ambulatory status Patient ambulates slowly he has assistive device  6 Inspection of the right wrist reveals mild tenderness   mild swelling mild deformity 7 Range of motion assessment: The range of motion is diminished primarily secondary to pain 8 Stability tests are deferred because of pain but the x-ray shows no subluxation of the joint 9 Strength assessment muscle tone is normal resistance testing is deferred because of pain and swelling  10 Nerve function normal sensation to soft touch 11 Vascular function normal color capillary refill and radial pulse 12 Local lymphatic system no lymphatic enlargement Flexion contracture ring finger Opposite extremity left there is no alignment abnormality, no contracture, no subluxation, no atrophy and neurovascular exam is intact  Data Reviewed X-rays I've  independently interpreted the x-ray as follows  3 views of the right wrist: First set of x-rays displaced distal radius fracture second set of x-rays shows mild angulation neutral volar tilt  Assessment    Right Wrist fracture  I placed him back in a splint after examining in his skin I recommend open treatment internal fixation he says he is 82 years old and he wants to think about it    Plan    Return 1 week for x-rays out of plaster

## 2016-07-04 ENCOUNTER — Encounter: Payer: Self-pay | Admitting: Orthopedic Surgery

## 2016-07-04 ENCOUNTER — Ambulatory Visit (INDEPENDENT_AMBULATORY_CARE_PROVIDER_SITE_OTHER): Payer: Medicare HMO | Admitting: Orthopedic Surgery

## 2016-07-04 ENCOUNTER — Ambulatory Visit (INDEPENDENT_AMBULATORY_CARE_PROVIDER_SITE_OTHER): Payer: Medicare HMO

## 2016-07-04 DIAGNOSIS — S52531D Colles' fracture of right radius, subsequent encounter for closed fracture with routine healing: Secondary | ICD-10-CM

## 2016-07-04 NOTE — Progress Notes (Signed)
Patient ID: Ralph Yang, male   DOB: 05-23-1934, 82 y.o.   MRN: [id number]  Chief Complaint  Patient presents with  . Follow-up    Right radius fracture, DOI 06/24/16    HPI Ralph Yang is a 82 y.o. male.   HPI  Follow-up after closed reduction of right distal radius fracture and emergency room. We advise the patient needed surgery he declined  He comes in for x-ray today complaining of minimal discomfort but still has deformity of the wrist  Review of Systems Review of Systems    Physical Exam  Deformity of the right wrist no skin lesions  X-ray show persistent malreduction  Patient placed in short arm cast at his request  Follow-up 4 weeks x-ray out of plaster

## 2016-07-31 DIAGNOSIS — R69 Illness, unspecified: Secondary | ICD-10-CM | POA: Diagnosis not present

## 2016-08-01 ENCOUNTER — Ambulatory Visit (INDEPENDENT_AMBULATORY_CARE_PROVIDER_SITE_OTHER): Payer: Medicare HMO | Admitting: Orthopedic Surgery

## 2016-08-01 ENCOUNTER — Ambulatory Visit (INDEPENDENT_AMBULATORY_CARE_PROVIDER_SITE_OTHER): Payer: Medicare HMO

## 2016-08-01 DIAGNOSIS — S52531D Colles' fracture of right radius, subsequent encounter for closed fracture with routine healing: Secondary | ICD-10-CM

## 2016-08-01 NOTE — Progress Notes (Signed)
Follow-up closed fracture right distal radius patient was advised to have surgery but refused  He is 5 weeks and 3 days post injury in a short arm cast his x-ray today shows expected displacement and malalignment  He has mild tenderness over the distal radius and he was placed in a cock-up splint to come back for x-rays in 3-4 weeks  Encounter Diagnosis  Name Primary?  . Closed Colles' fracture of right radius with routine healing, subsequent encounter Yes

## 2016-08-29 ENCOUNTER — Ambulatory Visit (INDEPENDENT_AMBULATORY_CARE_PROVIDER_SITE_OTHER): Payer: Medicare HMO

## 2016-08-29 ENCOUNTER — Encounter: Payer: Self-pay | Admitting: Orthopedic Surgery

## 2016-08-29 ENCOUNTER — Ambulatory Visit (INDEPENDENT_AMBULATORY_CARE_PROVIDER_SITE_OTHER): Payer: Self-pay | Admitting: Orthopedic Surgery

## 2016-08-29 DIAGNOSIS — S52531D Colles' fracture of right radius, subsequent encounter for closed fracture with routine healing: Secondary | ICD-10-CM

## 2016-08-29 NOTE — Progress Notes (Signed)
Patient ID: Ralph Yang, male   DOB: 05-24-1934, 83 y.o.   MRN: [id number]  Follow up visit/  fracture care  Chief Complaint  Patient presents with  . Follow-up    RIGHT RADIUS FRACTURE, DOI 06/24/16    The patient is 2 months after right distal radius fracture. He declined surgery despite my insistence. He has healed the fracture has no pain his x-ray shows that the fracture is healed but malaligned primarily in the coronal plane  He will follow-up as needed x-rays were checked today again fracture malalignment excepted patient's request   Encounter Diagnosis  Name Primary?  . Closed Colles' fracture of right radius with routine healing, subsequent encounter Yes    There were no vitals taken for this visit.  11:57 AM Arther Abbott, MD 08/29/2016

## 2016-09-22 DIAGNOSIS — Z933 Colostomy status: Secondary | ICD-10-CM | POA: Diagnosis not present

## 2016-09-22 DIAGNOSIS — C20 Malignant neoplasm of rectum: Secondary | ICD-10-CM | POA: Diagnosis not present

## 2016-09-23 DIAGNOSIS — E119 Type 2 diabetes mellitus without complications: Secondary | ICD-10-CM | POA: Diagnosis not present

## 2016-09-23 DIAGNOSIS — I10 Essential (primary) hypertension: Secondary | ICD-10-CM | POA: Diagnosis not present

## 2016-09-23 DIAGNOSIS — D509 Iron deficiency anemia, unspecified: Secondary | ICD-10-CM | POA: Diagnosis not present

## 2016-09-23 DIAGNOSIS — D519 Vitamin B12 deficiency anemia, unspecified: Secondary | ICD-10-CM | POA: Diagnosis not present

## 2016-09-25 DIAGNOSIS — E782 Mixed hyperlipidemia: Secondary | ICD-10-CM | POA: Diagnosis not present

## 2016-09-25 DIAGNOSIS — I10 Essential (primary) hypertension: Secondary | ICD-10-CM | POA: Diagnosis not present

## 2016-09-25 DIAGNOSIS — E119 Type 2 diabetes mellitus without complications: Secondary | ICD-10-CM | POA: Diagnosis not present

## 2016-09-30 DIAGNOSIS — R69 Illness, unspecified: Secondary | ICD-10-CM | POA: Diagnosis not present

## 2016-10-02 DIAGNOSIS — Z791 Long term (current) use of non-steroidal anti-inflammatories (NSAID): Secondary | ICD-10-CM | POA: Diagnosis not present

## 2016-10-02 DIAGNOSIS — Z8781 Personal history of (healed) traumatic fracture: Secondary | ICD-10-CM | POA: Diagnosis not present

## 2016-10-02 DIAGNOSIS — Z6827 Body mass index (BMI) 27.0-27.9, adult: Secondary | ICD-10-CM | POA: Diagnosis not present

## 2016-10-02 DIAGNOSIS — E119 Type 2 diabetes mellitus without complications: Secondary | ICD-10-CM | POA: Diagnosis not present

## 2016-10-02 DIAGNOSIS — Z7984 Long term (current) use of oral hypoglycemic drugs: Secondary | ICD-10-CM | POA: Diagnosis not present

## 2016-10-02 DIAGNOSIS — Z87891 Personal history of nicotine dependence: Secondary | ICD-10-CM | POA: Diagnosis not present

## 2016-10-02 DIAGNOSIS — Z79891 Long term (current) use of opiate analgesic: Secondary | ICD-10-CM | POA: Diagnosis not present

## 2016-10-02 DIAGNOSIS — Z79899 Other long term (current) drug therapy: Secondary | ICD-10-CM | POA: Diagnosis not present

## 2016-10-02 DIAGNOSIS — M1612 Unilateral primary osteoarthritis, left hip: Secondary | ICD-10-CM | POA: Diagnosis not present

## 2016-10-02 DIAGNOSIS — Z0000 Encounter for general adult medical examination without abnormal findings: Secondary | ICD-10-CM | POA: Diagnosis not present

## 2016-11-15 DIAGNOSIS — R69 Illness, unspecified: Secondary | ICD-10-CM | POA: Diagnosis not present

## 2016-11-25 DIAGNOSIS — Z933 Colostomy status: Secondary | ICD-10-CM | POA: Diagnosis not present

## 2016-11-25 DIAGNOSIS — C20 Malignant neoplasm of rectum: Secondary | ICD-10-CM | POA: Diagnosis not present

## 2017-01-06 DIAGNOSIS — R69 Illness, unspecified: Secondary | ICD-10-CM | POA: Diagnosis not present

## 2017-01-20 DIAGNOSIS — Z933 Colostomy status: Secondary | ICD-10-CM | POA: Diagnosis not present

## 2017-01-20 DIAGNOSIS — C20 Malignant neoplasm of rectum: Secondary | ICD-10-CM | POA: Diagnosis not present

## 2017-02-23 DIAGNOSIS — R69 Illness, unspecified: Secondary | ICD-10-CM | POA: Diagnosis not present

## 2017-03-22 ENCOUNTER — Encounter (HOSPITAL_COMMUNITY): Payer: Self-pay | Admitting: Cardiology

## 2017-03-22 ENCOUNTER — Emergency Department (HOSPITAL_COMMUNITY): Payer: Medicare HMO

## 2017-03-22 ENCOUNTER — Emergency Department (HOSPITAL_COMMUNITY)
Admission: EM | Admit: 2017-03-22 | Discharge: 2017-03-22 | Disposition: A | Discharge status: Home / Self Care | Payer: Medicare HMO | Attending: Emergency Medicine | Admitting: Emergency Medicine

## 2017-03-22 DIAGNOSIS — M545 Low back pain: Secondary | ICD-10-CM | POA: Insufficient documentation

## 2017-03-22 DIAGNOSIS — I10 Essential (primary) hypertension: Secondary | ICD-10-CM | POA: Insufficient documentation

## 2017-03-22 DIAGNOSIS — Z85048 Personal history of other malignant neoplasm of rectum, rectosigmoid junction, and anus: Secondary | ICD-10-CM | POA: Insufficient documentation

## 2017-03-22 DIAGNOSIS — Z79899 Other long term (current) drug therapy: Secondary | ICD-10-CM | POA: Insufficient documentation

## 2017-03-22 DIAGNOSIS — M25559 Pain in unspecified hip: Secondary | ICD-10-CM | POA: Diagnosis not present

## 2017-03-22 DIAGNOSIS — E119 Type 2 diabetes mellitus without complications: Secondary | ICD-10-CM | POA: Diagnosis not present

## 2017-03-22 DIAGNOSIS — W19XXXA Unspecified fall, initial encounter: Secondary | ICD-10-CM

## 2017-03-22 DIAGNOSIS — Z7984 Long term (current) use of oral hypoglycemic drugs: Secondary | ICD-10-CM | POA: Insufficient documentation

## 2017-03-22 DIAGNOSIS — S79911A Unspecified injury of right hip, initial encounter: Secondary | ICD-10-CM | POA: Diagnosis not present

## 2017-03-22 DIAGNOSIS — S3992XA Unspecified injury of lower back, initial encounter: Secondary | ICD-10-CM | POA: Diagnosis not present

## 2017-03-22 DIAGNOSIS — M25551 Pain in right hip: Secondary | ICD-10-CM | POA: Diagnosis not present

## 2017-03-22 LAB — BASIC METABOLIC PANEL
Anion gap: 8 (ref 5–15)
BUN: 16 mg/dL (ref 6–20)
CO2: 26 mmol/L (ref 22–32)
CREATININE: 0.86 mg/dL (ref 0.61–1.24)
Calcium: 8.8 mg/dL — ABNORMAL LOW (ref 8.9–10.3)
Chloride: 103 mmol/L (ref 101–111)
Glucose, Bld: 149 mg/dL — ABNORMAL HIGH (ref 65–99)
POTASSIUM: 4 mmol/L (ref 3.5–5.1)
SODIUM: 137 mmol/L (ref 135–145)

## 2017-03-22 LAB — CBC WITH DIFFERENTIAL/PLATELET
BASOS PCT: 0 %
Basophils Absolute: 0 10*3/uL (ref 0.0–0.1)
EOS ABS: 1 10*3/uL — AB (ref 0.0–0.7)
EOS PCT: 11 %
HCT: 37 % — ABNORMAL LOW (ref 39.0–52.0)
Hemoglobin: 12.6 g/dL — ABNORMAL LOW (ref 13.0–17.0)
LYMPHS ABS: 2 10*3/uL (ref 0.7–4.0)
Lymphocytes Relative: 22 %
MCH: 30.7 pg (ref 26.0–34.0)
MCHC: 34.1 g/dL (ref 30.0–36.0)
MCV: 90 fL (ref 78.0–100.0)
Monocytes Absolute: 0.7 10*3/uL (ref 0.1–1.0)
Monocytes Relative: 8 %
NEUTROS PCT: 59 %
Neutro Abs: 5.3 10*3/uL (ref 1.7–7.7)
PLATELETS: 163 10*3/uL (ref 150–400)
RBC: 4.11 MIL/uL — AB (ref 4.22–5.81)
RDW: 12.8 % (ref 11.5–15.5)
WBC: 8.9 10*3/uL (ref 4.0–10.5)

## 2017-03-22 LAB — URINALYSIS, ROUTINE W REFLEX MICROSCOPIC
BILIRUBIN URINE: NEGATIVE
Glucose, UA: NEGATIVE mg/dL
HGB URINE DIPSTICK: NEGATIVE
KETONES UR: NEGATIVE mg/dL
Leukocytes, UA: NEGATIVE
NITRITE: NEGATIVE
PROTEIN: NEGATIVE mg/dL
Specific Gravity, Urine: 1.011 (ref 1.005–1.030)
pH: 6 (ref 5.0–8.0)

## 2017-03-22 MED ORDER — OXYCODONE-ACETAMINOPHEN 5-325 MG PO TABS: 1.0000 | ORAL_TABLET | Freq: Four times a day (QID) | ORAL | 0 refills | Status: DC | PRN

## 2017-03-22 NOTE — ED Notes (Signed)
Pt contact phone number for social services to call for f/u is [phone].  This is their primary home phone number.

## 2017-03-22 NOTE — ED Notes (Signed)
Patient transported to X-ray 

## 2017-03-22 NOTE — Discharge Instructions (Signed)
Follow-up with your family doctor this week. The social worker should contact you about home health

## 2017-03-22 NOTE — ED Triage Notes (Signed)
Fall 3 days ago.   C/o chronic left hip pain and right hip pain times 3 days.

## 2017-03-22 NOTE — ED Provider Notes (Addendum)
[facility] DEPT Provider Note   CSN: [id number] Arrival date & time: 03/22/17  1300     History   Chief Complaint Chief Complaint  Patient presents with  . Fall    HPI Ralph Yang is a 83 y.o. male.  patient states that he fell a few days ago and has pain in his hip and back   The history is provided by the patient. No language interpreter was used.  Fall  This is a new problem. The current episode started more than 2 days ago. The problem occurs rarely. The problem has been resolved. Pertinent negatives include no chest pain, no abdominal pain and no headaches. Exacerbated by: movement. Nothing relieves the symptoms. He has tried nothing for the symptoms. The treatment provided no relief.    Past Medical History:  Diagnosis Date  . Arthritis   . Cancer ([place])    rectal  . Diabetes mellitus without complication ([place])   . Hypertension     There are no active problems to display for this patient.   Past Surgical History:  Procedure Laterality Date  . BACK SURGERY    . CHOLECYSTECTOMY    . COLOSTOMY         Home Medications    Prior to Admission medications   Medication Sig Start Date End Date Taking? Authorizing Provider  Cyanocobalamin (VITAMIN B 12 PO) Take 1 tablet by mouth daily.    Yes [provider]  glyBURIDE-metformin (GLUCOVANCE) 5-500 MG tablet Take 1 tablet by mouth daily. 02/28/15  Yes [provider]  naproxen sodium (ALEVE) 220 MG tablet Take 220 mg by mouth daily as needed (for pain).   Yes [provider]  pravastatin (PRAVACHOL) 20 MG tablet Take 20 mg by mouth at bedtime. 05/07/15  Yes [provider]  traMADol (ULTRAM) 50 MG tablet Take 50 mg by mouth every 6 (six) hours as needed for moderate pain.    Yes [provider]  Vitamin D, Cholecalciferol, 1000 UNITS TABS Take 1,000 Units by mouth daily.   Yes [provider]  oxyCODONE-acetaminophen (PERCOCET/ROXICET) 5-325 MG tablet  Take 1 tablet by mouth every 6 (six) hours as needed. 03/22/17   Milton Ferguson, MD    Family History History reviewed. No pertinent family history.  Social History Social History  Substance Use Topics  . Smoking status: Never Smoker  . Smokeless tobacco: Never Used  . Alcohol use No     Allergies   Sulfa antibiotics   Review of Systems Review of Systems  Constitutional: Negative for appetite change and fatigue.  HENT: Negative for congestion, ear discharge and sinus pressure.   Eyes: Negative for discharge.  Respiratory: Negative for cough.   Cardiovascular: Negative for chest pain.  Gastrointestinal: Negative for abdominal pain and diarrhea.  Genitourinary: Negative for frequency and hematuria.  Musculoskeletal: Negative for back pain.       Patient with back and hip pain  Skin: Negative for rash.  Neurological: Negative for seizures and headaches.  Psychiatric/Behavioral: Negative for hallucinations. Agitation: patient was able to ambulate with difficulty.     Physical Exam Updated Vital Signs BP (!) 179/75   Pulse 75   Temp 97.8 F (36.6 C) (Oral)   Resp 17   Ht 5' 8.5" (1.74 m)   Wt 76.7 kg (169 lb 3.2 oz)   SpO2 100%   BMI 25.35 kg/m   Physical Exam  Constitutional: He is oriented to person, place, and time. He appears well-developed.  HENT:  Head: Normocephalic.  Eyes: Conjunctivae and EOM are normal. No scleral icterus.  Neck: Neck supple. No thyromegaly present.  Cardiovascular: Normal rate and regular rhythm.  Exam reveals no gallop and no friction rub.   No murmur heard. Pulmonary/Chest: No stridor. He has no wheezes. He has no rales. He exhibits no tenderness.  Abdominal: He exhibits no distension. There is no tenderness. There is no rebound.  Musculoskeletal: Normal range of motion. He exhibits no edema.  Tenderness to right hip  Lymphadenopathy:    He has no cervical adenopathy.  Neurological: He is oriented to person, place, and time. He  exhibits normal muscle tone. Coordination normal.  Skin: No rash noted. No erythema.  Psychiatric: He has a normal mood and affect. His behavior is normal.     ED Treatments / Results  Labs (all labs ordered are listed, but only abnormal results are displayed) Labs Reviewed  CBC WITH DIFFERENTIAL/PLATELET - Abnormal; Notable for the following:       Result Value   RBC 4.11 (*)    Hemoglobin 12.6 (*)    HCT 37.0 (*)    Eosinophils Absolute 1.0 (*)    All other components within normal limits  BASIC METABOLIC PANEL - Abnormal; Notable for the following:    Glucose, Bld 149 (*)    Calcium 8.8 (*)    All other components within normal limits  URINALYSIS, ROUTINE W REFLEX MICROSCOPIC    EKG  EKG Interpretation None       Radiology Dg Lumbar Spine Complete  Result Date: 03/22/2017 CLINICAL DATA:  Acute lumbar spine pain following fall 3 days ago. Initial encounter. EXAM: LUMBAR SPINE - COMPLETE 4+ VIEW COMPARISON:  05/11/2015 and prior radiographs FINDINGS: No acute fracture or subluxation identified. Moderate multilevel degenerative disc disease and spondylosis noted. A moderate apex left lumbar scoliosis again identified. Degenerative changes in the left hip is again noted. IMPRESSION: No acute abnormality. Multilevel degenerative changes. Electronically Signed   By: Margarette Canada M.D.   On: 03/22/2017 19:52   Ct Hip Right Wo Contrast  Result Date: 03/22/2017 CLINICAL DATA:  Fall while exiting a bathroom 3 days ago. Right hip pain posteriorly. EXAM: CT OF THE RIGHT HIP WITHOUT CONTRAST TECHNIQUE: Multidetector CT imaging of the right hip was performed according to the standard protocol. Multiplanar CT image reconstructions were also generated. COMPARISON:  03/22/2017 radiographs FINDINGS: Bones/Joint/Cartilage Please note that CT imaging was confined to the right hip and did not include the entire pelvis. No fracture of the right proximal femur is identified. Acetabular spurring noted  with mild axial loss of articular space. The regularity along the superior margin of the iliopubic line is due to spurring. The no well-defined fracture of the superior pubic ramus or acetabulum is identified. No hip effusion. Ligaments Suboptimally assessed by CT. Muscles and Tendons Fatty atrophy of the gluteus minimus and part of the gluteus medius. Soft tissues Presacral soft tissue density not appreciably changed from prior PET-CT of 01/15/2007. IMPRESSION: 1. No right hip fracture or adjacent acetabular fracture is identified. 2. Presacral soft tissue density, not changed from prior PET-CT of 2008, likely therapy related. Electronically Signed   By: Van Clines M.D.   On: 03/22/2017 17:00   Dg Hip Unilat With Pelvis 2-3 Views Right  Result Date: 03/22/2017 CLINICAL DATA:  83 year old who fell while exiting his bathroom 3 days ago. Persistent posterior right hip pain. EXAM: DG HIP (WITH OR WITHOUT PELVIS) 2-3V RIGHT COMPARISON:  None. FINDINGS: Possible nondisplaced fracture  of the right superior pubic ramus. No fractures elsewhere. Moderate joint space narrowing with mild protrusio deformity. Included AP pelvis demonstrates severe joint space narrowing involving the contralateral left hip. Sacroiliac joints and symphysis pubis intact. Degenerative changes involving the visualized lower lumbar spine. Osseous demineralization. IMPRESSION: 1. Possible nondisplaced fracture involving the right superior pubic ramus. Unenhanced CT pelvis may be confirmatory. 2. Moderate osteoarthritis involving the right hip. 3. Severe osteoarthritis involving the left hip. 4. Degenerative changes involving the lower lumbar spine. Electronically Signed   By: Evangeline Dakin M.D.   On: 03/22/2017 14:42    Procedures Procedures (including critical care time)  Medications Ordered in ED Medications - No data to display   Initial Impression / Assessment and Plan / ED Course  I have reviewed the triage vital signs  and the nursing notes.  Pertinent labs & imaging results that were available during my care of the patient were reviewed by me and considered in my medical decision making (see chart for details).     Plain films and CT scan of the hip do not show any hip fractures or compression fractures in his back.patient was able to ambulate with moderate difficulty.He will be sent home with pain medicine he will use a walker and the social worker will contact them this week for possible home health  Final Clinical Impressions(s) / ED Diagnoses   Final diagnoses:  Fall, initial encounter    New Prescriptions New Prescriptions   OXYCODONE-ACETAMINOPHEN (PERCOCET/ROXICET) 5-325 MG TABLET    Take 1 tablet by mouth every 6 (six) hours as needed.     Milton Ferguson, MD 03/22/17 2029    Milton Ferguson, MD 04/04/17 1255

## 2017-03-25 NOTE — Care Management (Signed)
CM received call from pt's wife who had been told [facility] would be ordered but had not heard from anyone yet. CM has reviewed chart. MD states HH would be arranged but CM's on [facility] did not receive message about referral and no orders for [facility] were put in. CM explained to wife pt would need to have [facility] ordered by PCP. CM contacted pt's PCP, zach hall, who said pt would have to be seen before orders would be made. CM contacted pt again and encouraged them to call office this afternoon to make appointment.

## 2017-03-26 DIAGNOSIS — C20 Malignant neoplasm of rectum: Secondary | ICD-10-CM | POA: Diagnosis not present

## 2017-03-26 DIAGNOSIS — Z933 Colostomy status: Secondary | ICD-10-CM | POA: Diagnosis not present

## 2017-03-27 DIAGNOSIS — E782 Mixed hyperlipidemia: Secondary | ICD-10-CM | POA: Diagnosis not present

## 2017-03-27 DIAGNOSIS — E119 Type 2 diabetes mellitus without complications: Secondary | ICD-10-CM | POA: Diagnosis not present

## 2017-03-27 DIAGNOSIS — R296 Repeated falls: Secondary | ICD-10-CM | POA: Diagnosis not present

## 2017-03-27 DIAGNOSIS — Z6825 Body mass index (BMI) 25.0-25.9, adult: Secondary | ICD-10-CM | POA: Diagnosis not present

## 2017-03-27 DIAGNOSIS — D519 Vitamin B12 deficiency anemia, unspecified: Secondary | ICD-10-CM | POA: Diagnosis not present

## 2017-03-27 DIAGNOSIS — R531 Weakness: Secondary | ICD-10-CM | POA: Diagnosis not present

## 2017-03-27 DIAGNOSIS — M160 Bilateral primary osteoarthritis of hip: Secondary | ICD-10-CM | POA: Diagnosis not present

## 2017-04-01 DIAGNOSIS — R296 Repeated falls: Secondary | ICD-10-CM | POA: Diagnosis not present

## 2017-04-01 DIAGNOSIS — D519 Vitamin B12 deficiency anemia, unspecified: Secondary | ICD-10-CM | POA: Diagnosis not present

## 2017-04-01 DIAGNOSIS — M1711 Unilateral primary osteoarthritis, right knee: Secondary | ICD-10-CM | POA: Diagnosis not present

## 2017-04-01 DIAGNOSIS — D509 Iron deficiency anemia, unspecified: Secondary | ICD-10-CM | POA: Diagnosis not present

## 2017-04-01 DIAGNOSIS — I10 Essential (primary) hypertension: Secondary | ICD-10-CM | POA: Diagnosis not present

## 2017-04-01 DIAGNOSIS — Z87891 Personal history of nicotine dependence: Secondary | ICD-10-CM | POA: Diagnosis not present

## 2017-04-01 DIAGNOSIS — E785 Hyperlipidemia, unspecified: Secondary | ICD-10-CM | POA: Diagnosis not present

## 2017-04-01 DIAGNOSIS — M6281 Muscle weakness (generalized): Secondary | ICD-10-CM | POA: Diagnosis not present

## 2017-04-01 DIAGNOSIS — E119 Type 2 diabetes mellitus without complications: Secondary | ICD-10-CM | POA: Diagnosis not present

## 2017-04-01 DIAGNOSIS — M1712 Unilateral primary osteoarthritis, left knee: Secondary | ICD-10-CM | POA: Diagnosis not present

## 2017-04-02 DIAGNOSIS — E785 Hyperlipidemia, unspecified: Secondary | ICD-10-CM | POA: Diagnosis not present

## 2017-04-02 DIAGNOSIS — D519 Vitamin B12 deficiency anemia, unspecified: Secondary | ICD-10-CM | POA: Diagnosis not present

## 2017-04-02 DIAGNOSIS — E119 Type 2 diabetes mellitus without complications: Secondary | ICD-10-CM | POA: Diagnosis not present

## 2017-04-02 DIAGNOSIS — I10 Essential (primary) hypertension: Secondary | ICD-10-CM | POA: Diagnosis not present

## 2017-04-02 DIAGNOSIS — M1712 Unilateral primary osteoarthritis, left knee: Secondary | ICD-10-CM | POA: Diagnosis not present

## 2017-04-02 DIAGNOSIS — M6281 Muscle weakness (generalized): Secondary | ICD-10-CM | POA: Diagnosis not present

## 2017-04-02 DIAGNOSIS — M1711 Unilateral primary osteoarthritis, right knee: Secondary | ICD-10-CM | POA: Diagnosis not present

## 2017-04-02 DIAGNOSIS — R296 Repeated falls: Secondary | ICD-10-CM | POA: Diagnosis not present

## 2017-04-02 DIAGNOSIS — D509 Iron deficiency anemia, unspecified: Secondary | ICD-10-CM | POA: Diagnosis not present

## 2017-04-02 DIAGNOSIS — Z87891 Personal history of nicotine dependence: Secondary | ICD-10-CM | POA: Diagnosis not present

## 2017-04-04 DIAGNOSIS — R69 Illness, unspecified: Secondary | ICD-10-CM | POA: Diagnosis not present

## 2017-04-06 DIAGNOSIS — R296 Repeated falls: Secondary | ICD-10-CM | POA: Diagnosis not present

## 2017-04-06 DIAGNOSIS — M6281 Muscle weakness (generalized): Secondary | ICD-10-CM | POA: Diagnosis not present

## 2017-04-06 DIAGNOSIS — M1711 Unilateral primary osteoarthritis, right knee: Secondary | ICD-10-CM | POA: Diagnosis not present

## 2017-04-06 DIAGNOSIS — M1712 Unilateral primary osteoarthritis, left knee: Secondary | ICD-10-CM | POA: Diagnosis not present

## 2017-04-06 DIAGNOSIS — I10 Essential (primary) hypertension: Secondary | ICD-10-CM | POA: Diagnosis not present

## 2017-04-06 DIAGNOSIS — Z87891 Personal history of nicotine dependence: Secondary | ICD-10-CM | POA: Diagnosis not present

## 2017-04-06 DIAGNOSIS — E785 Hyperlipidemia, unspecified: Secondary | ICD-10-CM | POA: Diagnosis not present

## 2017-04-06 DIAGNOSIS — D519 Vitamin B12 deficiency anemia, unspecified: Secondary | ICD-10-CM | POA: Diagnosis not present

## 2017-04-06 DIAGNOSIS — D509 Iron deficiency anemia, unspecified: Secondary | ICD-10-CM | POA: Diagnosis not present

## 2017-04-06 DIAGNOSIS — E119 Type 2 diabetes mellitus without complications: Secondary | ICD-10-CM | POA: Diagnosis not present

## 2017-04-08 DIAGNOSIS — D509 Iron deficiency anemia, unspecified: Secondary | ICD-10-CM | POA: Diagnosis not present

## 2017-04-08 DIAGNOSIS — Z87891 Personal history of nicotine dependence: Secondary | ICD-10-CM | POA: Diagnosis not present

## 2017-04-08 DIAGNOSIS — I10 Essential (primary) hypertension: Secondary | ICD-10-CM | POA: Diagnosis not present

## 2017-04-08 DIAGNOSIS — R296 Repeated falls: Secondary | ICD-10-CM | POA: Diagnosis not present

## 2017-04-08 DIAGNOSIS — E119 Type 2 diabetes mellitus without complications: Secondary | ICD-10-CM | POA: Diagnosis not present

## 2017-04-08 DIAGNOSIS — M6281 Muscle weakness (generalized): Secondary | ICD-10-CM | POA: Diagnosis not present

## 2017-04-08 DIAGNOSIS — M1711 Unilateral primary osteoarthritis, right knee: Secondary | ICD-10-CM | POA: Diagnosis not present

## 2017-04-08 DIAGNOSIS — D519 Vitamin B12 deficiency anemia, unspecified: Secondary | ICD-10-CM | POA: Diagnosis not present

## 2017-04-08 DIAGNOSIS — M1712 Unilateral primary osteoarthritis, left knee: Secondary | ICD-10-CM | POA: Diagnosis not present

## 2017-04-08 DIAGNOSIS — E785 Hyperlipidemia, unspecified: Secondary | ICD-10-CM | POA: Diagnosis not present

## 2017-04-10 DIAGNOSIS — R296 Repeated falls: Secondary | ICD-10-CM | POA: Diagnosis not present

## 2017-04-10 DIAGNOSIS — E119 Type 2 diabetes mellitus without complications: Secondary | ICD-10-CM | POA: Diagnosis not present

## 2017-04-10 DIAGNOSIS — D509 Iron deficiency anemia, unspecified: Secondary | ICD-10-CM | POA: Diagnosis not present

## 2017-04-10 DIAGNOSIS — I10 Essential (primary) hypertension: Secondary | ICD-10-CM | POA: Diagnosis not present

## 2017-04-10 DIAGNOSIS — E785 Hyperlipidemia, unspecified: Secondary | ICD-10-CM | POA: Diagnosis not present

## 2017-04-10 DIAGNOSIS — D519 Vitamin B12 deficiency anemia, unspecified: Secondary | ICD-10-CM | POA: Diagnosis not present

## 2017-04-10 DIAGNOSIS — M1712 Unilateral primary osteoarthritis, left knee: Secondary | ICD-10-CM | POA: Diagnosis not present

## 2017-04-10 DIAGNOSIS — M1711 Unilateral primary osteoarthritis, right knee: Secondary | ICD-10-CM | POA: Diagnosis not present

## 2017-04-10 DIAGNOSIS — M6281 Muscle weakness (generalized): Secondary | ICD-10-CM | POA: Diagnosis not present

## 2017-04-10 DIAGNOSIS — Z87891 Personal history of nicotine dependence: Secondary | ICD-10-CM | POA: Diagnosis not present

## 2017-04-12 DIAGNOSIS — D509 Iron deficiency anemia, unspecified: Secondary | ICD-10-CM | POA: Diagnosis not present

## 2017-04-12 DIAGNOSIS — M1711 Unilateral primary osteoarthritis, right knee: Secondary | ICD-10-CM | POA: Diagnosis not present

## 2017-04-12 DIAGNOSIS — M1712 Unilateral primary osteoarthritis, left knee: Secondary | ICD-10-CM | POA: Diagnosis not present

## 2017-04-12 DIAGNOSIS — R296 Repeated falls: Secondary | ICD-10-CM | POA: Diagnosis not present

## 2017-04-12 DIAGNOSIS — D519 Vitamin B12 deficiency anemia, unspecified: Secondary | ICD-10-CM | POA: Diagnosis not present

## 2017-04-12 DIAGNOSIS — M6281 Muscle weakness (generalized): Secondary | ICD-10-CM | POA: Diagnosis not present

## 2017-04-12 DIAGNOSIS — E119 Type 2 diabetes mellitus without complications: Secondary | ICD-10-CM | POA: Diagnosis not present

## 2017-04-12 DIAGNOSIS — I10 Essential (primary) hypertension: Secondary | ICD-10-CM | POA: Diagnosis not present

## 2017-04-12 DIAGNOSIS — E785 Hyperlipidemia, unspecified: Secondary | ICD-10-CM | POA: Diagnosis not present

## 2017-04-12 DIAGNOSIS — Z87891 Personal history of nicotine dependence: Secondary | ICD-10-CM | POA: Diagnosis not present

## 2017-04-14 DIAGNOSIS — R296 Repeated falls: Secondary | ICD-10-CM | POA: Diagnosis not present

## 2017-04-14 DIAGNOSIS — D509 Iron deficiency anemia, unspecified: Secondary | ICD-10-CM | POA: Diagnosis not present

## 2017-04-14 DIAGNOSIS — M6281 Muscle weakness (generalized): Secondary | ICD-10-CM | POA: Diagnosis not present

## 2017-04-14 DIAGNOSIS — M1712 Unilateral primary osteoarthritis, left knee: Secondary | ICD-10-CM | POA: Diagnosis not present

## 2017-04-14 DIAGNOSIS — Z87891 Personal history of nicotine dependence: Secondary | ICD-10-CM | POA: Diagnosis not present

## 2017-04-14 DIAGNOSIS — E119 Type 2 diabetes mellitus without complications: Secondary | ICD-10-CM | POA: Diagnosis not present

## 2017-04-14 DIAGNOSIS — M1711 Unilateral primary osteoarthritis, right knee: Secondary | ICD-10-CM | POA: Diagnosis not present

## 2017-04-14 DIAGNOSIS — E785 Hyperlipidemia, unspecified: Secondary | ICD-10-CM | POA: Diagnosis not present

## 2017-04-14 DIAGNOSIS — D519 Vitamin B12 deficiency anemia, unspecified: Secondary | ICD-10-CM | POA: Diagnosis not present

## 2017-04-14 DIAGNOSIS — I10 Essential (primary) hypertension: Secondary | ICD-10-CM | POA: Diagnosis not present

## 2017-04-20 DIAGNOSIS — M1712 Unilateral primary osteoarthritis, left knee: Secondary | ICD-10-CM | POA: Diagnosis not present

## 2017-04-20 DIAGNOSIS — E119 Type 2 diabetes mellitus without complications: Secondary | ICD-10-CM | POA: Diagnosis not present

## 2017-04-20 DIAGNOSIS — M1711 Unilateral primary osteoarthritis, right knee: Secondary | ICD-10-CM | POA: Diagnosis not present

## 2017-04-20 DIAGNOSIS — E785 Hyperlipidemia, unspecified: Secondary | ICD-10-CM | POA: Diagnosis not present

## 2017-04-20 DIAGNOSIS — I10 Essential (primary) hypertension: Secondary | ICD-10-CM | POA: Diagnosis not present

## 2017-04-20 DIAGNOSIS — R296 Repeated falls: Secondary | ICD-10-CM | POA: Diagnosis not present

## 2017-04-20 DIAGNOSIS — M6281 Muscle weakness (generalized): Secondary | ICD-10-CM | POA: Diagnosis not present

## 2017-04-20 DIAGNOSIS — Z87891 Personal history of nicotine dependence: Secondary | ICD-10-CM | POA: Diagnosis not present

## 2017-04-20 DIAGNOSIS — D509 Iron deficiency anemia, unspecified: Secondary | ICD-10-CM | POA: Diagnosis not present

## 2017-04-20 DIAGNOSIS — D519 Vitamin B12 deficiency anemia, unspecified: Secondary | ICD-10-CM | POA: Diagnosis not present

## 2017-04-22 DIAGNOSIS — I10 Essential (primary) hypertension: Secondary | ICD-10-CM | POA: Diagnosis not present

## 2017-04-22 DIAGNOSIS — E119 Type 2 diabetes mellitus without complications: Secondary | ICD-10-CM | POA: Diagnosis not present

## 2017-04-22 DIAGNOSIS — D519 Vitamin B12 deficiency anemia, unspecified: Secondary | ICD-10-CM | POA: Diagnosis not present

## 2017-04-22 DIAGNOSIS — D509 Iron deficiency anemia, unspecified: Secondary | ICD-10-CM | POA: Diagnosis not present

## 2017-04-24 DIAGNOSIS — D519 Vitamin B12 deficiency anemia, unspecified: Secondary | ICD-10-CM | POA: Diagnosis not present

## 2017-04-24 DIAGNOSIS — I10 Essential (primary) hypertension: Secondary | ICD-10-CM | POA: Diagnosis not present

## 2017-04-24 DIAGNOSIS — D509 Iron deficiency anemia, unspecified: Secondary | ICD-10-CM | POA: Diagnosis not present

## 2017-04-24 DIAGNOSIS — E119 Type 2 diabetes mellitus without complications: Secondary | ICD-10-CM | POA: Diagnosis not present

## 2017-04-28 DIAGNOSIS — Z87891 Personal history of nicotine dependence: Secondary | ICD-10-CM | POA: Diagnosis not present

## 2017-04-28 DIAGNOSIS — E785 Hyperlipidemia, unspecified: Secondary | ICD-10-CM | POA: Diagnosis not present

## 2017-04-28 DIAGNOSIS — R296 Repeated falls: Secondary | ICD-10-CM | POA: Diagnosis not present

## 2017-04-28 DIAGNOSIS — E119 Type 2 diabetes mellitus without complications: Secondary | ICD-10-CM | POA: Diagnosis not present

## 2017-04-28 DIAGNOSIS — D509 Iron deficiency anemia, unspecified: Secondary | ICD-10-CM | POA: Diagnosis not present

## 2017-04-28 DIAGNOSIS — M1712 Unilateral primary osteoarthritis, left knee: Secondary | ICD-10-CM | POA: Diagnosis not present

## 2017-04-28 DIAGNOSIS — I10 Essential (primary) hypertension: Secondary | ICD-10-CM | POA: Diagnosis not present

## 2017-04-28 DIAGNOSIS — D519 Vitamin B12 deficiency anemia, unspecified: Secondary | ICD-10-CM | POA: Diagnosis not present

## 2017-04-28 DIAGNOSIS — M1711 Unilateral primary osteoarthritis, right knee: Secondary | ICD-10-CM | POA: Diagnosis not present

## 2017-04-28 DIAGNOSIS — M6281 Muscle weakness (generalized): Secondary | ICD-10-CM | POA: Diagnosis not present

## 2017-05-06 DIAGNOSIS — Z87891 Personal history of nicotine dependence: Secondary | ICD-10-CM | POA: Diagnosis not present

## 2017-05-06 DIAGNOSIS — M1712 Unilateral primary osteoarthritis, left knee: Secondary | ICD-10-CM | POA: Diagnosis not present

## 2017-05-06 DIAGNOSIS — E785 Hyperlipidemia, unspecified: Secondary | ICD-10-CM | POA: Diagnosis not present

## 2017-05-06 DIAGNOSIS — D509 Iron deficiency anemia, unspecified: Secondary | ICD-10-CM | POA: Diagnosis not present

## 2017-05-06 DIAGNOSIS — R296 Repeated falls: Secondary | ICD-10-CM | POA: Diagnosis not present

## 2017-05-06 DIAGNOSIS — I10 Essential (primary) hypertension: Secondary | ICD-10-CM | POA: Diagnosis not present

## 2017-05-06 DIAGNOSIS — E119 Type 2 diabetes mellitus without complications: Secondary | ICD-10-CM | POA: Diagnosis not present

## 2017-05-06 DIAGNOSIS — D519 Vitamin B12 deficiency anemia, unspecified: Secondary | ICD-10-CM | POA: Diagnosis not present

## 2017-05-06 DIAGNOSIS — M6281 Muscle weakness (generalized): Secondary | ICD-10-CM | POA: Diagnosis not present

## 2017-05-06 DIAGNOSIS — M1711 Unilateral primary osteoarthritis, right knee: Secondary | ICD-10-CM | POA: Diagnosis not present

## 2017-05-07 DIAGNOSIS — Z87891 Personal history of nicotine dependence: Secondary | ICD-10-CM | POA: Diagnosis not present

## 2017-05-07 DIAGNOSIS — D509 Iron deficiency anemia, unspecified: Secondary | ICD-10-CM | POA: Diagnosis not present

## 2017-05-07 DIAGNOSIS — E119 Type 2 diabetes mellitus without complications: Secondary | ICD-10-CM | POA: Diagnosis not present

## 2017-05-07 DIAGNOSIS — M1711 Unilateral primary osteoarthritis, right knee: Secondary | ICD-10-CM | POA: Diagnosis not present

## 2017-05-07 DIAGNOSIS — M1712 Unilateral primary osteoarthritis, left knee: Secondary | ICD-10-CM | POA: Diagnosis not present

## 2017-05-07 DIAGNOSIS — D519 Vitamin B12 deficiency anemia, unspecified: Secondary | ICD-10-CM | POA: Diagnosis not present

## 2017-05-07 DIAGNOSIS — M6281 Muscle weakness (generalized): Secondary | ICD-10-CM | POA: Diagnosis not present

## 2017-05-07 DIAGNOSIS — I10 Essential (primary) hypertension: Secondary | ICD-10-CM | POA: Diagnosis not present

## 2017-05-07 DIAGNOSIS — E785 Hyperlipidemia, unspecified: Secondary | ICD-10-CM | POA: Diagnosis not present

## 2017-05-07 DIAGNOSIS — R296 Repeated falls: Secondary | ICD-10-CM | POA: Diagnosis not present

## 2017-05-15 DIAGNOSIS — C189 Malignant neoplasm of colon, unspecified: Secondary | ICD-10-CM | POA: Diagnosis not present

## 2017-05-15 DIAGNOSIS — Z85038 Personal history of other malignant neoplasm of large intestine: Secondary | ICD-10-CM | POA: Diagnosis not present

## 2017-05-28 DIAGNOSIS — R69 Illness, unspecified: Secondary | ICD-10-CM | POA: Diagnosis not present

## 2017-05-28 DIAGNOSIS — Z933 Colostomy status: Secondary | ICD-10-CM | POA: Diagnosis not present

## 2017-05-28 DIAGNOSIS — C20 Malignant neoplasm of rectum: Secondary | ICD-10-CM | POA: Diagnosis not present

## 2017-07-05 DIAGNOSIS — R69 Illness, unspecified: Secondary | ICD-10-CM | POA: Diagnosis not present

## 2017-07-30 ENCOUNTER — Other Ambulatory Visit: Payer: Self-pay

## 2017-07-30 ENCOUNTER — Encounter (HOSPITAL_COMMUNITY): Payer: Self-pay | Admitting: Emergency Medicine

## 2017-07-30 ENCOUNTER — Emergency Department (HOSPITAL_COMMUNITY)
Admission: EM | Admit: 2017-07-30 | Discharge: 2017-07-30 | Disposition: A | Discharge status: Home / Self Care | Payer: Medicare HMO | Attending: Emergency Medicine | Admitting: Emergency Medicine

## 2017-07-30 DIAGNOSIS — Z79899 Other long term (current) drug therapy: Secondary | ICD-10-CM | POA: Insufficient documentation

## 2017-07-30 DIAGNOSIS — R531 Weakness: Secondary | ICD-10-CM | POA: Diagnosis not present

## 2017-07-30 DIAGNOSIS — E119 Type 2 diabetes mellitus without complications: Secondary | ICD-10-CM | POA: Insufficient documentation

## 2017-07-30 DIAGNOSIS — M1711 Unilateral primary osteoarthritis, right knee: Secondary | ICD-10-CM | POA: Diagnosis not present

## 2017-07-30 DIAGNOSIS — R1030 Lower abdominal pain, unspecified: Secondary | ICD-10-CM | POA: Insufficient documentation

## 2017-07-30 DIAGNOSIS — N401 Enlarged prostate with lower urinary tract symptoms: Secondary | ICD-10-CM | POA: Insufficient documentation

## 2017-07-30 DIAGNOSIS — R339 Retention of urine, unspecified: Secondary | ICD-10-CM | POA: Diagnosis not present

## 2017-07-30 DIAGNOSIS — Z85048 Personal history of other malignant neoplasm of rectum, rectosigmoid junction, and anus: Secondary | ICD-10-CM | POA: Insufficient documentation

## 2017-07-30 DIAGNOSIS — D519 Vitamin B12 deficiency anemia, unspecified: Secondary | ICD-10-CM | POA: Diagnosis not present

## 2017-07-30 DIAGNOSIS — R296 Repeated falls: Secondary | ICD-10-CM | POA: Diagnosis not present

## 2017-07-30 DIAGNOSIS — I10 Essential (primary) hypertension: Secondary | ICD-10-CM | POA: Diagnosis not present

## 2017-07-30 DIAGNOSIS — Z7984 Long term (current) use of oral hypoglycemic drugs: Secondary | ICD-10-CM | POA: Diagnosis not present

## 2017-07-30 DIAGNOSIS — M1712 Unilateral primary osteoarthritis, left knee: Secondary | ICD-10-CM | POA: Diagnosis not present

## 2017-07-30 DIAGNOSIS — E782 Mixed hyperlipidemia: Secondary | ICD-10-CM | POA: Diagnosis not present

## 2017-07-30 DIAGNOSIS — Z125 Encounter for screening for malignant neoplasm of prostate: Secondary | ICD-10-CM | POA: Diagnosis not present

## 2017-07-30 DIAGNOSIS — D509 Iron deficiency anemia, unspecified: Secondary | ICD-10-CM | POA: Diagnosis not present

## 2017-07-30 LAB — URINALYSIS, ROUTINE W REFLEX MICROSCOPIC
Bilirubin Urine: NEGATIVE
Glucose, UA: NEGATIVE mg/dL
Hgb urine dipstick: NEGATIVE
Ketones, ur: NEGATIVE mg/dL
Leukocytes, UA: NEGATIVE
Nitrite: NEGATIVE
Protein, ur: NEGATIVE mg/dL
Specific Gravity, Urine: 1.024 (ref 1.005–1.030)
pH: 5 (ref 5.0–8.0)

## 2017-07-30 MED ORDER — TAMSULOSIN HCL 0.4 MG PO CAPS: 0.4000 mg | ORAL_CAPSULE | Freq: Every day | ORAL | 0 refills | Status: DC

## 2017-07-30 NOTE — ED Notes (Signed)
Catheter bag changed to leg bag at this time and pt verbalized understanding of catheter care and need to follow up with urology.

## 2017-07-30 NOTE — ED Triage Notes (Signed)
PT states he has been unable to urinate since yesterday morning with hx of prostate enlargement. PT c/o pelvic pain and pressure.

## 2017-07-30 NOTE — ED Provider Notes (Signed)
[facility] EMERGENCY DEPARTMENT Provider Note   CSN: [id number] Arrival date & time: 07/30/17  5625     History   Chief Complaint Chief Complaint  Patient presents with  . Urinary Retention    HPI Ralph Yang is a 84 y.o. male.  HPI   84 year old male with urinary retention.  He reports that he has not urinated sniffily since yesterday.  He has had increasing lower abdominal pain.  He reports a past history of enlarged prostate but no problems with urinary retention previously.  No recent dysuria or hematuria.  Past Medical History:  Diagnosis Date  . Arthritis   . Cancer ([place])    rectal  . Diabetes mellitus without complication ([place])   . Hypertension     There are no active problems to display for this patient.   Past Surgical History:  Procedure Laterality Date  . BACK SURGERY    . CHOLECYSTECTOMY    . COLOSTOMY         Home Medications    Prior to Admission medications   Medication Sig Start Date End Date Taking? Authorizing Provider  Cyanocobalamin (VITAMIN B 12 PO) Take 1 tablet by mouth daily.    Yes [provider]  glyBURIDE-metformin (GLUCOVANCE) 5-500 MG tablet Take 1 tablet by mouth daily. 02/28/15  Yes [provider]  ibuprofen (ADVIL,MOTRIN) 200 MG tablet Take 400 mg by mouth every 6 (six) hours as needed for mild pain or moderate pain.   Yes [provider]  lisinopril (PRINIVIL,ZESTRIL) 5 MG tablet Take 5 mg by mouth daily. 07/06/17  Yes [provider]  naproxen sodium (ALEVE) 220 MG tablet Take 220 mg by mouth daily as needed (for pain).   Yes [provider]  pravastatin (PRAVACHOL) 20 MG tablet Take 10 mg by mouth at bedtime.  05/07/15  Yes [provider]  traMADol (ULTRAM) 50 MG tablet Take 50 mg by mouth every 6 (six) hours as needed for moderate pain.    Yes [provider]  Vitamin D, Cholecalciferol, 1000 UNITS TABS Take 1,000 Units by mouth daily.   Yes [provider]    Family History History reviewed. No pertinent family history.  Social History Social History   Tobacco Use  . Smoking status: Never Smoker  . Smokeless tobacco: Never Used  Substance Use Topics  . Alcohol use: No  . Drug use: No     Allergies   Sulfa antibiotics   Review of Systems Review of Systems  All systems reviewed and negative, other than as noted in HPI.  Physical Exam Updated Vital Signs BP (!) 155/75 (BP Location: Right Arm)   Pulse 83   Temp 98 F (36.7 C) (Oral)   Resp 18   Ht 5\' 8"  (1.727 m)   Wt 77.1 kg (170 lb)   SpO2 97%   BMI 25.85 kg/m   Physical Exam  Constitutional: He appears well-developed and well-nourished. No distress.  HENT:  Head: Normocephalic and atraumatic.  Eyes: Conjunctivae are normal. Right eye exhibits no discharge. Left eye exhibits no discharge.  Neck: Neck supple.  Cardiovascular: Normal rate, regular rhythm and normal heart sounds. Exam reveals no gallop and no friction rub.  No murmur heard. Pulmonary/Chest: Effort normal and breath sounds normal. No respiratory distress.  Abdominal: Soft. He exhibits no distension. There is tenderness.  Bladder palpably distended, tight and very tender.  Musculoskeletal: He exhibits no edema or tenderness.  Neurological: He is alert.  Skin: Skin is  warm and dry.  Psychiatric: He has a normal mood and affect. His behavior is normal. Thought content normal.  Nursing note and vitals reviewed.    ED Treatments / Results  Labs (all labs ordered are listed, but only abnormal results are displayed) Labs Reviewed  URINE CULTURE  URINALYSIS, ROUTINE W REFLEX MICROSCOPIC    EKG  EKG Interpretation None       Radiology No results found.  Procedures Procedures (including critical care time)  Medications Ordered in ED Medications - No data to display   Initial Impression / Assessment and Plan / ED Course  I have reviewed the triage vital signs and the  nursing notes.  Pertinent labs & imaging results that were available during my care of the patient were reviewed by me and considered in my medical decision making (see chart for details).     84 year old male with urinary retention.  Likely secondary to BPH.  Foley placed with resolution of symptoms.  He will be discharged with it in place.  Urology follow-up.  Final Clinical Impressions(s) / ED Diagnoses   Final diagnoses:  Urinary retention    ED Discharge Orders    None       Virgel Manifold, MD 07/31/17 1702

## 2017-08-01 LAB — URINE CULTURE: Culture: NO GROWTH

## 2017-08-03 DIAGNOSIS — E119 Type 2 diabetes mellitus without complications: Secondary | ICD-10-CM | POA: Diagnosis not present

## 2017-08-03 DIAGNOSIS — D519 Vitamin B12 deficiency anemia, unspecified: Secondary | ICD-10-CM | POA: Diagnosis not present

## 2017-08-03 DIAGNOSIS — T83098A Other mechanical complication of other indwelling urethral catheter, initial encounter: Secondary | ICD-10-CM | POA: Diagnosis not present

## 2017-08-03 DIAGNOSIS — E782 Mixed hyperlipidemia: Secondary | ICD-10-CM | POA: Diagnosis not present

## 2017-08-03 DIAGNOSIS — I10 Essential (primary) hypertension: Secondary | ICD-10-CM | POA: Diagnosis not present

## 2017-08-03 DIAGNOSIS — D509 Iron deficiency anemia, unspecified: Secondary | ICD-10-CM | POA: Diagnosis not present

## 2017-08-03 DIAGNOSIS — M4800 Spinal stenosis, site unspecified: Secondary | ICD-10-CM | POA: Diagnosis not present

## 2017-08-03 DIAGNOSIS — M1711 Unilateral primary osteoarthritis, right knee: Secondary | ICD-10-CM | POA: Diagnosis not present

## 2017-08-03 DIAGNOSIS — M1712 Unilateral primary osteoarthritis, left knee: Secondary | ICD-10-CM | POA: Diagnosis not present

## 2017-08-03 DIAGNOSIS — Z6824 Body mass index (BMI) 24.0-24.9, adult: Secondary | ICD-10-CM | POA: Diagnosis not present

## 2017-08-06 DIAGNOSIS — D519 Vitamin B12 deficiency anemia, unspecified: Secondary | ICD-10-CM | POA: Diagnosis not present

## 2017-08-06 DIAGNOSIS — R339 Retention of urine, unspecified: Secondary | ICD-10-CM | POA: Diagnosis not present

## 2017-08-06 DIAGNOSIS — N400 Enlarged prostate without lower urinary tract symptoms: Secondary | ICD-10-CM | POA: Diagnosis not present

## 2017-08-06 DIAGNOSIS — Z9181 History of falling: Secondary | ICD-10-CM | POA: Diagnosis not present

## 2017-08-06 DIAGNOSIS — E119 Type 2 diabetes mellitus without complications: Secondary | ICD-10-CM | POA: Diagnosis not present

## 2017-08-06 DIAGNOSIS — Z7984 Long term (current) use of oral hypoglycemic drugs: Secondary | ICD-10-CM | POA: Diagnosis not present

## 2017-08-06 DIAGNOSIS — I10 Essential (primary) hypertension: Secondary | ICD-10-CM | POA: Diagnosis not present

## 2017-08-06 DIAGNOSIS — M160 Bilateral primary osteoarthritis of hip: Secondary | ICD-10-CM | POA: Diagnosis not present

## 2017-08-06 DIAGNOSIS — Z933 Colostomy status: Secondary | ICD-10-CM | POA: Diagnosis not present

## 2017-08-06 DIAGNOSIS — D509 Iron deficiency anemia, unspecified: Secondary | ICD-10-CM | POA: Diagnosis not present

## 2017-08-07 DIAGNOSIS — Z933 Colostomy status: Secondary | ICD-10-CM | POA: Diagnosis not present

## 2017-08-07 DIAGNOSIS — C20 Malignant neoplasm of rectum: Secondary | ICD-10-CM | POA: Diagnosis not present

## 2017-08-11 DIAGNOSIS — N400 Enlarged prostate without lower urinary tract symptoms: Secondary | ICD-10-CM | POA: Diagnosis not present

## 2017-08-11 DIAGNOSIS — R339 Retention of urine, unspecified: Secondary | ICD-10-CM | POA: Diagnosis not present

## 2017-08-11 DIAGNOSIS — Z9181 History of falling: Secondary | ICD-10-CM | POA: Diagnosis not present

## 2017-08-11 DIAGNOSIS — Z7984 Long term (current) use of oral hypoglycemic drugs: Secondary | ICD-10-CM | POA: Diagnosis not present

## 2017-08-11 DIAGNOSIS — M160 Bilateral primary osteoarthritis of hip: Secondary | ICD-10-CM | POA: Diagnosis not present

## 2017-08-11 DIAGNOSIS — Z933 Colostomy status: Secondary | ICD-10-CM | POA: Diagnosis not present

## 2017-08-11 DIAGNOSIS — I10 Essential (primary) hypertension: Secondary | ICD-10-CM | POA: Diagnosis not present

## 2017-08-11 DIAGNOSIS — D519 Vitamin B12 deficiency anemia, unspecified: Secondary | ICD-10-CM | POA: Diagnosis not present

## 2017-08-11 DIAGNOSIS — D509 Iron deficiency anemia, unspecified: Secondary | ICD-10-CM | POA: Diagnosis not present

## 2017-08-11 DIAGNOSIS — E119 Type 2 diabetes mellitus without complications: Secondary | ICD-10-CM | POA: Diagnosis not present

## 2017-08-18 DIAGNOSIS — E119 Type 2 diabetes mellitus without complications: Secondary | ICD-10-CM | POA: Diagnosis not present

## 2017-08-18 DIAGNOSIS — R339 Retention of urine, unspecified: Secondary | ICD-10-CM | POA: Diagnosis not present

## 2017-08-18 DIAGNOSIS — Z7984 Long term (current) use of oral hypoglycemic drugs: Secondary | ICD-10-CM | POA: Diagnosis not present

## 2017-08-18 DIAGNOSIS — D519 Vitamin B12 deficiency anemia, unspecified: Secondary | ICD-10-CM | POA: Diagnosis not present

## 2017-08-18 DIAGNOSIS — D509 Iron deficiency anemia, unspecified: Secondary | ICD-10-CM | POA: Diagnosis not present

## 2017-08-18 DIAGNOSIS — I10 Essential (primary) hypertension: Secondary | ICD-10-CM | POA: Diagnosis not present

## 2017-08-18 DIAGNOSIS — N400 Enlarged prostate without lower urinary tract symptoms: Secondary | ICD-10-CM | POA: Diagnosis not present

## 2017-08-18 DIAGNOSIS — Z933 Colostomy status: Secondary | ICD-10-CM | POA: Diagnosis not present

## 2017-08-18 DIAGNOSIS — M160 Bilateral primary osteoarthritis of hip: Secondary | ICD-10-CM | POA: Diagnosis not present

## 2017-08-18 DIAGNOSIS — Z9181 History of falling: Secondary | ICD-10-CM | POA: Diagnosis not present

## 2017-08-21 DIAGNOSIS — R69 Illness, unspecified: Secondary | ICD-10-CM | POA: Diagnosis not present

## 2017-08-25 DIAGNOSIS — M1990 Unspecified osteoarthritis, unspecified site: Secondary | ICD-10-CM | POA: Diagnosis not present

## 2017-08-25 DIAGNOSIS — Z933 Colostomy status: Secondary | ICD-10-CM | POA: Diagnosis not present

## 2017-08-25 DIAGNOSIS — R269 Unspecified abnormalities of gait and mobility: Secondary | ICD-10-CM | POA: Diagnosis not present

## 2017-08-25 DIAGNOSIS — R339 Retention of urine, unspecified: Secondary | ICD-10-CM | POA: Diagnosis not present

## 2017-08-25 DIAGNOSIS — D509 Iron deficiency anemia, unspecified: Secondary | ICD-10-CM | POA: Diagnosis not present

## 2017-08-25 DIAGNOSIS — D519 Vitamin B12 deficiency anemia, unspecified: Secondary | ICD-10-CM | POA: Diagnosis not present

## 2017-08-25 DIAGNOSIS — G8929 Other chronic pain: Secondary | ICD-10-CM | POA: Diagnosis not present

## 2017-08-25 DIAGNOSIS — Z9181 History of falling: Secondary | ICD-10-CM | POA: Diagnosis not present

## 2017-08-25 DIAGNOSIS — E119 Type 2 diabetes mellitus without complications: Secondary | ICD-10-CM | POA: Diagnosis not present

## 2017-08-25 DIAGNOSIS — I10 Essential (primary) hypertension: Secondary | ICD-10-CM | POA: Diagnosis not present

## 2017-08-25 DIAGNOSIS — M160 Bilateral primary osteoarthritis of hip: Secondary | ICD-10-CM | POA: Diagnosis not present

## 2017-08-25 DIAGNOSIS — N400 Enlarged prostate without lower urinary tract symptoms: Secondary | ICD-10-CM | POA: Diagnosis not present

## 2017-08-25 DIAGNOSIS — E785 Hyperlipidemia, unspecified: Secondary | ICD-10-CM | POA: Diagnosis not present

## 2017-08-25 DIAGNOSIS — Z7722 Contact with and (suspected) exposure to environmental tobacco smoke (acute) (chronic): Secondary | ICD-10-CM | POA: Diagnosis not present

## 2017-08-25 DIAGNOSIS — Z7984 Long term (current) use of oral hypoglycemic drugs: Secondary | ICD-10-CM | POA: Diagnosis not present

## 2017-09-01 DIAGNOSIS — E119 Type 2 diabetes mellitus without complications: Secondary | ICD-10-CM | POA: Diagnosis not present

## 2017-09-01 DIAGNOSIS — Z7984 Long term (current) use of oral hypoglycemic drugs: Secondary | ICD-10-CM | POA: Diagnosis not present

## 2017-09-01 DIAGNOSIS — N400 Enlarged prostate without lower urinary tract symptoms: Secondary | ICD-10-CM | POA: Diagnosis not present

## 2017-09-01 DIAGNOSIS — D509 Iron deficiency anemia, unspecified: Secondary | ICD-10-CM | POA: Diagnosis not present

## 2017-09-01 DIAGNOSIS — M160 Bilateral primary osteoarthritis of hip: Secondary | ICD-10-CM | POA: Diagnosis not present

## 2017-09-01 DIAGNOSIS — Z933 Colostomy status: Secondary | ICD-10-CM | POA: Diagnosis not present

## 2017-09-01 DIAGNOSIS — R339 Retention of urine, unspecified: Secondary | ICD-10-CM | POA: Diagnosis not present

## 2017-09-01 DIAGNOSIS — D519 Vitamin B12 deficiency anemia, unspecified: Secondary | ICD-10-CM | POA: Diagnosis not present

## 2017-09-01 DIAGNOSIS — Z9181 History of falling: Secondary | ICD-10-CM | POA: Diagnosis not present

## 2017-09-01 DIAGNOSIS — I10 Essential (primary) hypertension: Secondary | ICD-10-CM | POA: Diagnosis not present

## 2017-09-08 DIAGNOSIS — M4800 Spinal stenosis, site unspecified: Secondary | ICD-10-CM | POA: Diagnosis not present

## 2017-09-08 DIAGNOSIS — G894 Chronic pain syndrome: Secondary | ICD-10-CM | POA: Diagnosis not present

## 2017-09-08 DIAGNOSIS — M1990 Unspecified osteoarthritis, unspecified site: Secondary | ICD-10-CM | POA: Diagnosis not present

## 2017-09-09 ENCOUNTER — Ambulatory Visit: Payer: Medicare HMO | Admitting: Urology

## 2017-09-10 DIAGNOSIS — Z9181 History of falling: Secondary | ICD-10-CM | POA: Diagnosis not present

## 2017-09-10 DIAGNOSIS — I10 Essential (primary) hypertension: Secondary | ICD-10-CM | POA: Diagnosis not present

## 2017-09-10 DIAGNOSIS — E119 Type 2 diabetes mellitus without complications: Secondary | ICD-10-CM | POA: Diagnosis not present

## 2017-09-10 DIAGNOSIS — Z7984 Long term (current) use of oral hypoglycemic drugs: Secondary | ICD-10-CM | POA: Diagnosis not present

## 2017-09-10 DIAGNOSIS — Z933 Colostomy status: Secondary | ICD-10-CM | POA: Diagnosis not present

## 2017-09-10 DIAGNOSIS — M160 Bilateral primary osteoarthritis of hip: Secondary | ICD-10-CM | POA: Diagnosis not present

## 2017-09-10 DIAGNOSIS — D519 Vitamin B12 deficiency anemia, unspecified: Secondary | ICD-10-CM | POA: Diagnosis not present

## 2017-09-10 DIAGNOSIS — R339 Retention of urine, unspecified: Secondary | ICD-10-CM | POA: Diagnosis not present

## 2017-09-10 DIAGNOSIS — N400 Enlarged prostate without lower urinary tract symptoms: Secondary | ICD-10-CM | POA: Diagnosis not present

## 2017-09-10 DIAGNOSIS — D509 Iron deficiency anemia, unspecified: Secondary | ICD-10-CM | POA: Diagnosis not present

## 2017-09-30 DIAGNOSIS — Z933 Colostomy status: Secondary | ICD-10-CM | POA: Diagnosis not present

## 2017-09-30 DIAGNOSIS — C20 Malignant neoplasm of rectum: Secondary | ICD-10-CM | POA: Diagnosis not present

## 2017-10-14 ENCOUNTER — Ambulatory Visit: Payer: Medicare HMO | Admitting: Urology

## 2017-10-14 DIAGNOSIS — R338 Other retention of urine: Secondary | ICD-10-CM | POA: Diagnosis not present

## 2017-10-19 DIAGNOSIS — R69 Illness, unspecified: Secondary | ICD-10-CM | POA: Diagnosis not present

## 2017-10-29 DIAGNOSIS — E782 Mixed hyperlipidemia: Secondary | ICD-10-CM | POA: Diagnosis not present

## 2017-10-29 DIAGNOSIS — Z6824 Body mass index (BMI) 24.0-24.9, adult: Secondary | ICD-10-CM | POA: Diagnosis not present

## 2017-10-29 DIAGNOSIS — M1712 Unilateral primary osteoarthritis, left knee: Secondary | ICD-10-CM | POA: Diagnosis not present

## 2017-10-29 DIAGNOSIS — E119 Type 2 diabetes mellitus without complications: Secondary | ICD-10-CM | POA: Diagnosis not present

## 2017-10-29 DIAGNOSIS — T83098A Other mechanical complication of other indwelling urethral catheter, initial encounter: Secondary | ICD-10-CM | POA: Diagnosis not present

## 2017-10-29 DIAGNOSIS — R296 Repeated falls: Secondary | ICD-10-CM | POA: Diagnosis not present

## 2017-10-29 DIAGNOSIS — D519 Vitamin B12 deficiency anemia, unspecified: Secondary | ICD-10-CM | POA: Diagnosis not present

## 2017-10-29 DIAGNOSIS — I10 Essential (primary) hypertension: Secondary | ICD-10-CM | POA: Diagnosis not present

## 2017-10-29 DIAGNOSIS — D509 Iron deficiency anemia, unspecified: Secondary | ICD-10-CM | POA: Diagnosis not present

## 2017-10-29 DIAGNOSIS — M1711 Unilateral primary osteoarthritis, right knee: Secondary | ICD-10-CM | POA: Diagnosis not present

## 2017-11-03 DIAGNOSIS — E119 Type 2 diabetes mellitus without complications: Secondary | ICD-10-CM | POA: Diagnosis not present

## 2017-11-03 DIAGNOSIS — E782 Mixed hyperlipidemia: Secondary | ICD-10-CM | POA: Diagnosis not present

## 2017-11-03 DIAGNOSIS — M1711 Unilateral primary osteoarthritis, right knee: Secondary | ICD-10-CM | POA: Diagnosis not present

## 2017-11-03 DIAGNOSIS — Z6824 Body mass index (BMI) 24.0-24.9, adult: Secondary | ICD-10-CM | POA: Diagnosis not present

## 2017-11-03 DIAGNOSIS — D519 Vitamin B12 deficiency anemia, unspecified: Secondary | ICD-10-CM | POA: Diagnosis not present

## 2017-11-03 DIAGNOSIS — T83098A Other mechanical complication of other indwelling urethral catheter, initial encounter: Secondary | ICD-10-CM | POA: Diagnosis not present

## 2017-11-03 DIAGNOSIS — E875 Hyperkalemia: Secondary | ICD-10-CM | POA: Diagnosis not present

## 2017-11-03 DIAGNOSIS — R296 Repeated falls: Secondary | ICD-10-CM | POA: Diagnosis not present

## 2017-11-03 DIAGNOSIS — R339 Retention of urine, unspecified: Secondary | ICD-10-CM | POA: Diagnosis not present

## 2017-11-03 DIAGNOSIS — D509 Iron deficiency anemia, unspecified: Secondary | ICD-10-CM | POA: Diagnosis not present

## 2017-11-03 DIAGNOSIS — G9009 Other idiopathic peripheral autonomic neuropathy: Secondary | ICD-10-CM | POA: Diagnosis not present

## 2017-11-03 DIAGNOSIS — M1712 Unilateral primary osteoarthritis, left knee: Secondary | ICD-10-CM | POA: Diagnosis not present

## 2017-11-03 DIAGNOSIS — I10 Essential (primary) hypertension: Secondary | ICD-10-CM | POA: Diagnosis not present

## 2017-11-26 DIAGNOSIS — Z933 Colostomy status: Secondary | ICD-10-CM | POA: Diagnosis not present

## 2017-11-26 DIAGNOSIS — C20 Malignant neoplasm of rectum: Secondary | ICD-10-CM | POA: Diagnosis not present

## 2017-12-06 DIAGNOSIS — R69 Illness, unspecified: Secondary | ICD-10-CM | POA: Diagnosis not present

## 2017-12-23 DIAGNOSIS — R69 Illness, unspecified: Secondary | ICD-10-CM | POA: Diagnosis not present

## 2017-12-26 ENCOUNTER — Encounter (HOSPITAL_COMMUNITY): Payer: Self-pay

## 2017-12-26 ENCOUNTER — Observation Stay (HOSPITAL_COMMUNITY)
Admission: EM | Admit: 2017-12-26 | Discharge: 2017-12-30 | Disposition: A | Discharge status: Home / Self Care | Payer: Medicare HMO | Attending: Internal Medicine | Admitting: Internal Medicine

## 2017-12-26 ENCOUNTER — Emergency Department (HOSPITAL_COMMUNITY): Payer: Medicare HMO

## 2017-12-26 ENCOUNTER — Other Ambulatory Visit: Payer: Self-pay

## 2017-12-26 DIAGNOSIS — Z7984 Long term (current) use of oral hypoglycemic drugs: Secondary | ICD-10-CM | POA: Diagnosis not present

## 2017-12-26 DIAGNOSIS — M1990 Unspecified osteoarthritis, unspecified site: Secondary | ICD-10-CM

## 2017-12-26 DIAGNOSIS — Z85040 Personal history of malignant carcinoid tumor of rectum: Secondary | ICD-10-CM | POA: Insufficient documentation

## 2017-12-26 DIAGNOSIS — E119 Type 2 diabetes mellitus without complications: Secondary | ICD-10-CM

## 2017-12-26 DIAGNOSIS — M5410 Radiculopathy, site unspecified: Secondary | ICD-10-CM | POA: Diagnosis not present

## 2017-12-26 DIAGNOSIS — I10 Essential (primary) hypertension: Secondary | ICD-10-CM | POA: Diagnosis not present

## 2017-12-26 DIAGNOSIS — R627 Adult failure to thrive: Secondary | ICD-10-CM | POA: Diagnosis present

## 2017-12-26 DIAGNOSIS — K435 Parastomal hernia without obstruction or  gangrene: Secondary | ICD-10-CM | POA: Diagnosis not present

## 2017-12-26 DIAGNOSIS — M545 Low back pain, unspecified: Secondary | ICD-10-CM

## 2017-12-26 DIAGNOSIS — M549 Dorsalgia, unspecified: Secondary | ICD-10-CM | POA: Diagnosis present

## 2017-12-26 DIAGNOSIS — N400 Enlarged prostate without lower urinary tract symptoms: Secondary | ICD-10-CM | POA: Diagnosis not present

## 2017-12-26 DIAGNOSIS — M5489 Other dorsalgia: Secondary | ICD-10-CM | POA: Diagnosis not present

## 2017-12-26 DIAGNOSIS — Z79899 Other long term (current) drug therapy: Secondary | ICD-10-CM | POA: Diagnosis not present

## 2017-12-26 DIAGNOSIS — C20 Malignant neoplasm of rectum: Secondary | ICD-10-CM | POA: Diagnosis not present

## 2017-12-26 DIAGNOSIS — E785 Hyperlipidemia, unspecified: Secondary | ICD-10-CM | POA: Insufficient documentation

## 2017-12-26 DIAGNOSIS — R52 Pain, unspecified: Secondary | ICD-10-CM | POA: Diagnosis not present

## 2017-12-26 DIAGNOSIS — C801 Malignant (primary) neoplasm, unspecified: Secondary | ICD-10-CM

## 2017-12-26 LAB — CBC WITH DIFFERENTIAL/PLATELET
Basophils Absolute: 0 10*3/uL (ref 0.0–0.1)
Basophils Relative: 0 %
Eosinophils Absolute: 0.8 10*3/uL — ABNORMAL HIGH (ref 0.0–0.7)
Eosinophils Relative: 8 %
HCT: 37.5 % — ABNORMAL LOW (ref 39.0–52.0)
Hemoglobin: 12.4 g/dL — ABNORMAL LOW (ref 13.0–17.0)
Lymphocytes Relative: 26 %
Lymphs Abs: 2.3 10*3/uL (ref 0.7–4.0)
MCH: 29.5 pg (ref 26.0–34.0)
MCHC: 33.1 g/dL (ref 30.0–36.0)
MCV: 89.1 fL (ref 78.0–100.0)
Monocytes Absolute: 0.6 10*3/uL (ref 0.1–1.0)
Monocytes Relative: 6 %
Neutro Abs: 5.3 10*3/uL (ref 1.7–7.7)
Neutrophils Relative %: 60 %
Platelets: 194 10*3/uL (ref 150–400)
RBC: 4.21 MIL/uL — ABNORMAL LOW (ref 4.22–5.81)
RDW: 12.9 % (ref 11.5–15.5)
WBC: 8.9 10*3/uL (ref 4.0–10.5)

## 2017-12-26 LAB — COMPREHENSIVE METABOLIC PANEL
ALT: 19 U/L (ref 17–63)
AST: 22 U/L (ref 15–41)
Albumin: 3.4 g/dL — ABNORMAL LOW (ref 3.5–5.0)
Alkaline Phosphatase: 98 U/L (ref 38–126)
Anion gap: 10 (ref 5–15)
BUN: 14 mg/dL (ref 6–20)
CO2: 23 mmol/L (ref 22–32)
Calcium: 9.2 mg/dL (ref 8.9–10.3)
Chloride: 105 mmol/L (ref 101–111)
Creatinine, Ser: 0.84 mg/dL (ref 0.61–1.24)
GFR calc Af Amer: >60 mL/min (ref >60)
GFR calc non Af Amer: >60 mL/min (ref >60)
Glucose, Bld: 83 mg/dL (ref 65–99)
Potassium: 3.9 mmol/L (ref 3.5–5.1)
Sodium: 138 mmol/L (ref 135–145)
Total Bilirubin: 1 mg/dL (ref 0.3–1.2)
Total Protein: 6.6 g/dL (ref 6.5–8.1)

## 2017-12-26 LAB — URINALYSIS, ROUTINE W REFLEX MICROSCOPIC
Bilirubin Urine: NEGATIVE
Glucose, UA: NEGATIVE mg/dL
Hgb urine dipstick: NEGATIVE
Ketones, ur: NEGATIVE mg/dL
Leukocytes, UA: NEGATIVE
Nitrite: NEGATIVE
Protein, ur: NEGATIVE mg/dL
Specific Gravity, Urine: 1.019 (ref 1.005–1.030)
pH: 6 (ref 5.0–8.0)

## 2017-12-26 LAB — GLUCOSE, CAPILLARY: GLUCOSE-CAPILLARY: 148 mg/dL — AB (ref 65–99)

## 2017-12-26 MED ORDER — PRAVASTATIN SODIUM 10 MG PO TABS
10.0000 mg | ORAL_TABLET | Freq: Every day | ORAL | Status: DC
  Filled 2017-12-26 (×4): qty 1

## 2017-12-26 MED ORDER — ACETAMINOPHEN 650 MG RE SUPP: 650.0000 mg | Freq: Four times a day (QID) | RECTAL | Status: DC | PRN

## 2017-12-26 MED ORDER — ONDANSETRON HCL 4 MG PO TABS: 4.0000 mg | ORAL_TABLET | Freq: Four times a day (QID) | ORAL | Status: DC | PRN

## 2017-12-26 MED ORDER — TAMSULOSIN HCL 0.4 MG PO CAPS
0.4000 mg | ORAL_CAPSULE | Freq: Every day | ORAL | Status: DC
  Administered 2017-12-26 – 2017-12-30 (×5): 0.4 mg via ORAL
  Filled 2017-12-26 (×5): qty 1

## 2017-12-26 MED ORDER — GLYBURIDE-METFORMIN 5-500 MG PO TABS: 1.0000 | ORAL_TABLET | Freq: Every day | ORAL | Status: DC

## 2017-12-26 MED ORDER — INSULIN ASPART 100 UNIT/ML ~~LOC~~ SOLN: 0.0000 [IU] | Freq: Every day | SUBCUTANEOUS | Status: DC

## 2017-12-26 MED ORDER — MORPHINE SULFATE (PF) 4 MG/ML IV SOLN
4.0000 mg | Freq: Once | INTRAVENOUS | Status: AC
  Administered 2017-12-26: 4 mg via INTRAVENOUS
  Filled 2017-12-26: qty 1

## 2017-12-26 MED ORDER — ONDANSETRON HCL 4 MG/2ML IJ SOLN: 4.0000 mg | Freq: Four times a day (QID) | INTRAMUSCULAR | Status: DC | PRN

## 2017-12-26 MED ORDER — ENOXAPARIN SODIUM 40 MG/0.4ML ~~LOC~~ SOLN
40.0000 mg | SUBCUTANEOUS | Status: DC
  Administered 2017-12-26 – 2017-12-28 (×3): 40 mg via SUBCUTANEOUS
  Filled 2017-12-26 (×3): qty 0.4

## 2017-12-26 MED ORDER — HYDROCODONE-ACETAMINOPHEN 5-325 MG PO TABS
1.0000 | ORAL_TABLET | ORAL | Status: DC | PRN
  Administered 2017-12-26 – 2017-12-28 (×4): 2 via ORAL
  Administered 2017-12-29: 1 via ORAL
  Administered 2017-12-29 – 2017-12-30 (×2): 2 via ORAL
  Filled 2017-12-26 (×4): qty 2
  Filled 2017-12-26: qty 1
  Filled 2017-12-26 (×2): qty 2

## 2017-12-26 MED ORDER — LISINOPRIL 5 MG PO TABS
5.0000 mg | ORAL_TABLET | Freq: Every day | ORAL | Status: DC
  Administered 2017-12-26 – 2017-12-30 (×5): 5 mg via ORAL
  Filled 2017-12-26 (×5): qty 1

## 2017-12-26 MED ORDER — PREDNISONE 20 MG PO TABS
40.0000 mg | ORAL_TABLET | Freq: Every day | ORAL | Status: DC
  Administered 2017-12-26 – 2017-12-30 (×5): 40 mg via ORAL
  Filled 2017-12-26 (×5): qty 2

## 2017-12-26 MED ORDER — METFORMIN HCL 500 MG PO TABS
500.0000 mg | ORAL_TABLET | Freq: Every day | ORAL | Status: DC
  Administered 2017-12-26: 500 mg via ORAL
  Filled 2017-12-26 (×2): qty 1

## 2017-12-26 MED ORDER — INSULIN ASPART 100 UNIT/ML ~~LOC~~ SOLN
0.0000 [IU] | Freq: Three times a day (TID) | SUBCUTANEOUS | Status: DC
  Administered 2017-12-27: 2 [IU] via SUBCUTANEOUS
  Administered 2017-12-27: 3 [IU] via SUBCUTANEOUS
  Administered 2017-12-28: 2 [IU] via SUBCUTANEOUS
  Administered 2017-12-28: 3 [IU] via SUBCUTANEOUS
  Administered 2017-12-28 – 2017-12-29 (×3): 5 [IU] via SUBCUTANEOUS
  Administered 2017-12-30: 11 [IU] via SUBCUTANEOUS
  Administered 2017-12-30: 2 [IU] via SUBCUTANEOUS

## 2017-12-26 MED ORDER — GLYBURIDE 5 MG PO TABS
5.0000 mg | ORAL_TABLET | Freq: Every day | ORAL | Status: DC
  Administered 2017-12-26: 5 mg via ORAL
  Filled 2017-12-26 (×2): qty 1

## 2017-12-26 MED ORDER — ACETAMINOPHEN 325 MG PO TABS: 650.0000 mg | ORAL_TABLET | Freq: Four times a day (QID) | ORAL | Status: DC | PRN

## 2017-12-26 MED ORDER — IOPAMIDOL (ISOVUE-300) INJECTION 61%
100.0000 mL | Freq: Once | INTRAVENOUS | Status: AC | PRN
  Administered 2017-12-26: 100 mL via INTRAVENOUS

## 2017-12-26 NOTE — ED Provider Notes (Signed)
[facility] EMERGENCY DEPARTMENT Provider Note   CSN: [id number] Arrival date & time: 12/26/17  1254     History   Chief Complaint Chief Complaint  Patient presents with  . Back Pain    HPI Ralph Yang is a 84 y.o. male.  HPI  The patient is an unfortunate 84 year old male who presents to the hospital with a complaint of fairly severe back pain.  This pain is chronic however it acutely worsened approximately 4 or 5 days ago.  He has been seen and evaluated by his family doctors in the past, he was seen by Dr. Aline Brochure after he had a fall in September of last year and had x-rays showing arthritis of his spine, he was told that he had very severe arthritis.  He is unsure if he has had prior spinal surgery but thinks that he may have when he was younger.  He does recall having a fall a couple of weeks ago but his back pain only became worse and in the last 4 or 5 days.  It is to the point now where he cannot get out of his chair at home, when he walks he feels like his legs do not work right, he cannot walk for weakness in his legs.  He has no difficulty passing urine or having bowel movements as he has had a colostomy placed after rectal cancer in 2005.  No history of fevers no history of IV drug use, no history of prior MRI for further evaluation of his back.  At this time his pain is severe, it is worse with movement but it is constant and keeps him up all night long.  Past Medical History:  Diagnosis Date  . Arthritis   . Cancer ([place])    rectal  . Diabetes mellitus without complication ([place])   . Hypertension     Patient Active Problem List   Diagnosis Date Noted  . Hypertension   . Diabetes mellitus without complication ([place])   . Arthritis   . Cancer [facility])     Past Surgical History:  Procedure Laterality Date  . BACK SURGERY    . CHOLECYSTECTOMY    . COLOSTOMY    . COLOSTOMY          Home Medications    Prior to Admission medications   Medication Sig Start  Date End Date Taking? Authorizing Provider  Cyanocobalamin (VITAMIN B 12 PO) Take 1 tablet by mouth daily.     [provider]  glyBURIDE-metformin (GLUCOVANCE) 5-500 MG tablet Take 1 tablet by mouth daily. 02/28/15   [provider]  ibuprofen (ADVIL,MOTRIN) 200 MG tablet Take 400 mg by mouth every 6 (six) hours as needed for mild pain or moderate pain.    [provider]  lisinopril (PRINIVIL,ZESTRIL) 5 MG tablet Take 5 mg by mouth daily. 07/06/17   [provider]  metFORMIN (GLUCOPHAGE) 500 MG tablet Take 500 mg by mouth 2 (two) times daily. 11/26/17   [provider]  naproxen sodium (ALEVE) 220 MG tablet Take 220 mg by mouth daily as needed (for pain).    [provider]  pravastatin (PRAVACHOL) 20 MG tablet Take 10 mg by mouth at bedtime.  05/07/15   [provider]  Saw Palmetto 450 MG CAPS Take 450 mg by mouth 2 (two) times daily.    [provider]  tamsulosin (FLOMAX) 0.4 MG CAPS capsule Take 1 capsule (0.4 mg total) by mouth daily. 07/30/17   Virgel Manifold,  MD  traMADol (ULTRAM) 50 MG tablet Take 50 mg by mouth every 6 (six) hours as needed for moderate pain.     [provider]  Vitamin D, Cholecalciferol, 1000 UNITS TABS Take 1,000 Units by mouth daily.    [provider]    Family History No family history on file.  Social History Social History   Tobacco Use  . Smoking status: Never Smoker  . Smokeless tobacco: Never Used  Substance Use Topics  . Alcohol use: No  . Drug use: No     Allergies   Sulfa antibiotics   Review of Systems Review of Systems  All other systems reviewed and are negative.    Physical Exam Updated Vital Signs BP 134/69   Pulse 70   Temp (!) 97.1 F (36.2 C) (Oral)   Resp 18   Ht 5\' 8"  (1.727 m)   Wt 78 kg (172 lb)   SpO2 99%   BMI 26.15 kg/m   Physical Exam  Constitutional: He appears well-developed and well-nourished. No distress.  The  patient is disheveled, his clothes are unkempt  HENT:  Head: Normocephalic and atraumatic.  Mouth/Throat: No oropharyngeal exudate.  Mucous membranes are slightly dry  Eyes: Pupils are equal, round, and reactive to light. Conjunctivae and EOM are normal. Right eye exhibits no discharge. Left eye exhibits no discharge. No scleral icterus.  Neck: Normal range of motion. Neck supple. No JVD present. No thyromegaly present.  Cardiovascular: Normal rate, regular rhythm, normal heart sounds and intact distal pulses. Exam reveals no gallop and no friction rub.  No murmur heard. Pulmonary/Chest: Effort normal and breath sounds normal. No respiratory distress. He has no wheezes. He has no rales.  Abdominal: Soft. Bowel sounds are normal. He exhibits no distension and no mass. There is no tenderness.  There is no pulsating mass, abdomen is soft and nontender, ostomy bag is patent, no tenderness around this area, skin is intact otherwise  Musculoskeletal: Normal range of motion. He exhibits tenderness. He exhibits no edema.  There is no edema of the legs, he is able to move his hips knees and ankles through range of motion with supple joints without pain passively however actively he has severe weakness that limits this.  He does have tenderness across his lower back diffusely  Lymphadenopathy:    He has no cervical adenopathy.  Neurological: He is alert. Coordination normal.  Patient is alert and awake, he is able to answer my questions for the most part, both of his arms have normal strength and coordination, his speech is normal, there is no facial droop.  He is able to lift both legs off the bed a couple of inches but has severe weakness and pain in his back with any movement.  He is able to bend his knees with some assistance in the flexor function at the knee seems to be intact, the extensor mechanism seems to be intact though he is diffusely weak.  His sensation is decreased diffusely across the left  leg and his right leg has lateral numbness and medial foot slight numbness.  He has no reflexes at the patellar tendons bilaterally  Skin: Skin is warm and dry. No rash noted. No erythema.  Psychiatric: He has a normal mood and affect. His behavior is normal.  Nursing note and vitals reviewed.    ED Treatments / Results  Labs (all labs ordered are listed, but only abnormal results are displayed) Labs Reviewed  CBC WITH DIFFERENTIAL/PLATELET - Abnormal;  Notable for the following components:      Result Value   RBC 4.21 (*)    Hemoglobin 12.4 (*)    HCT 37.5 (*)    Eosinophils Absolute 0.8 (*)    All other components within normal limits  COMPREHENSIVE METABOLIC PANEL - Abnormal; Notable for the following components:   Albumin 3.4 (*)    All other components within normal limits  URINALYSIS, ROUTINE W REFLEX MICROSCOPIC    EKG None  Radiology Ct Abdomen Pelvis W Contrast  Result Date: 12/26/2017 CLINICAL DATA:  Worsening abdominal and back pain. Personal history of rectal carcinoma. EXAM: CT ABDOMEN AND PELVIS WITH CONTRAST TECHNIQUE: Multidetector CT imaging of the abdomen and pelvis was performed using the standard protocol following bolus administration of intravenous contrast. CONTRAST:  180mL ISOVUE-300 IOPAMIDOL (ISOVUE-300) INJECTION 61% COMPARISON:  PET-CT on 01/15/2007 FINDINGS: Lower Chest: No acute findings. Hepatobiliary: No hepatic masses identified. Prior cholecystectomy. No evidence of biliary obstruction. Pancreas:  No mass or inflammatory changes. Spleen: Within normal limits in size and appearance. Adrenals/Urinary Tract: No masses identified. Bilateral lower pole renal cyst noted. No evidence of hydronephrosis. Stomach/Bowel: Postop changes from previous sigmoid colectomy with left lower quadrant colostomy. A large peristomal hernia is again seen containing sigmoid colon. No evidence of diverticulitis or bowel obstruction. Presacral surgical clips in soft tissue density  remains stable, consistent with post treatment changes. Vascular/Lymphatic: No pathologically enlarged lymph nodes. No abdominal aortic aneurysm. Aortic atherosclerosis. Reproductive:  No mass or other significant abnormality. Other:  None. Musculoskeletal: No suspicious bone lesions identified. Lumbar spine degenerative changes and severe left hip osteoarthritis noted. IMPRESSION: No acute findings. Stable post treatment changes in presacral region. No evidence of recurrent or metastatic carcinoma. Stable left lower quadrant colostomy with large peristomal hernia containing colon. Electronically Signed   By: Earle Gell M.D.   On: 12/26/2017 16:40   Ct L-spine No Charge  Result Date: 12/26/2017 CLINICAL DATA:  Chronic low back pain.  Rectal cancer. EXAM: CT LUMBAR SPINE WITHOUT CONTRAST TECHNIQUE: Multidetector CT imaging of the lumbar spine was performed without intravenous contrast administration. Multiplanar CT image reconstructions were also generated. Images were generated from the same data set as the CT abdomen and pelvis of the same day. COMPARISON:  Lumbar spine radiographs 03/22/2017 FINDINGS: Segmentation: 5 non rib-bearing lumbar type vertebral bodies are present. The lowest fully formed vertebral body is L5. Alignment: AP alignment is anatomic. Levoconvex curvature is centered at L2-3. Vertebrae: Vertebral body heights are maintained. Endplate sclerotic changes are noted on the right at L3-4. No focal lytic or blastic lesions are present. Paraspinal and other soft tissues: Atherosclerotic calcifications are present in the aorta. Renal cystic changes are present bilaterally. Please see dedicated CT abdomen report from the same day. Disc levels: There is acquired fusion across the disc space at L1-2 and L2-3. There is fusion across the disc space at L5-S1. Vacuum disc is present at L3-4 and L4-5. Right foraminal narrowing is present at L3-4 due to a rightward disc protrusion and asymmetric facet  disease. Moderate central canal stenosis is present at L4-5 with left greater than right subarticular narrowing. Moderate foraminal stenosis is present bilaterally at L4-5 as well. Bone spurs contribute to moderate left subarticular and foraminal stenosis at L5-S1. IMPRESSION: 1. No acute abnormality. 2. Moderate right foraminal stenosis at L3-4 due to a rightward disc protrusion and facet hypertrophy. 3. Moderate central canal narrowing with left greater than right subarticular narrowing and right greater than left moderate foraminal stenosis  at L4-5. 4. Moderate left subarticular and foraminal stenosis at L5-S1 secondary to bone spurs and facet hypertrophy. Electronically Signed   By: San Morelle M.D.   On: 12/26/2017 16:16    Procedures Procedures (including critical care time)  Medications Ordered in ED Medications  morphine 4 MG/ML injection 4 mg (4 mg Intravenous Given 12/26/17 1357)  iopamidol (ISOVUE-300) 61 % injection 100 mL (100 mLs Intravenous Contrast Given 12/26/17 1456)     Initial Impression / Assessment and Plan / ED Course  I have reviewed the triage vital signs and the nursing notes.  Pertinent labs & imaging results that were available during my care of the patient were reviewed by me and considered in my medical decision making (see chart for details).    The patient has an abnormal neurologic exam, he is having significant back pain which is acutely worsened over 4 or 5 days which is 24 hours a day and worse with movement but persistent with even sleeping and laying down in his recliner.  He has a complete inability to care for himself, his elderly wife cannot care for him either.  There is another person in the house but they have back pain and are unable to help out as well.  He does have some neurologic symptoms which prompt the need for an MRI.  Will obtain some basic labs, pain control and discussed with the hospitalist regarding admission for further work-up, MRI  and potentially placement.  Given IV morphine with minimal improvement  CT scan does not reveal any signs of obvious pathology of the spine, his lab work shows no signs of specific abnormalities, he was able to pass urine spontaneously over 600 mL and had no residual.  The patient is still unable to stand or walk  Discussed with Dr. Delia Chimes of the hospitalist service will admit.  Patient has failure to thrive, severe back pain and weakness of his legs and will need further work-up inpatient including likely an MRI    Final Clinical Impressions(s) / ED Diagnoses   Final diagnoses:  Acute bilateral low back pain without sciatica  Failure to thrive in adult      Noemi Chapel, MD 12/26/17 1650

## 2017-12-26 NOTE — ED Notes (Signed)
Pt spouse states that the physician has been speking to them regarding EC placement  Unsure if this is why pt is here today  Spouse states it is more and more difficult to care for pt

## 2017-12-26 NOTE — ED Notes (Signed)
Pt able to void. 660ml. Post bladder scan request by dr. Sabra Heck done. Pt had less then 47ml residual.

## 2017-12-26 NOTE — ED Notes (Signed)
Pt with chronic back pain   Followed by Dr Nevada Crane   Here due to getting worse  Pt lives at home with spouse

## 2017-12-26 NOTE — ED Notes (Signed)
Call to Floor for report

## 2017-12-26 NOTE — ED Notes (Signed)
Report to Amy, RN

## 2017-12-26 NOTE — ED Triage Notes (Signed)
Pt c/o pain in lower back for "a while" but says recently getting worse.  Denies injury.

## 2017-12-26 NOTE — H&P (Signed)
History and Physical  Ralph Yang PIR:[id number] DOB: 1934-05-16 DOA: 12/26/2017  Referring physician: Dr Sabra Heck, ED physician PCP: Celene Squibb, MD  Outpatient Specialists:   Patient Coming From: Home  Chief Complaint: Back pain with inability to walk  HPI: Ralph Yang is a 84 y.o. male with a history of diabetes, hypertension, arthritis, history of rectal cancer.  Patient seen for debilitating, worsening back pain.  Patient has always had a history of back pain that is been evaluated by Ortho in the past.  He did not have any scans done other than x-rays.  The orthopedist noted significant arthritis.  His back pain is been worsening over the past 3 to 4 days to the point where he has extreme pain to walk.  He sits most of the time in his chair because the pain.  His wife is been unable to take care of him, as well as another family member in the home.  EDP noted that the patient appeared quite disheveled and unkept upon arrival.  Patient has not had a bath or shower in several days.  Pain is increasing.  Pain radiates down both legs to the back of the knee.  Patient has been increasingly weak.  Also notes numbness in lower legs and feet.  Pain worse with ambulation and improved with rest.  Emergency Department Course: CT scan of back and pelvis normal.  Labs are normal.  Review of Systems:   HomePt denies any fevers, chills, nausea, vomiting, diarrhea, constipation, abdominal pain, shortness of breath, dyspnea on exertion, orthopnea, cough, wheezing, palpitations, headache, vision changes, lightheadedness, dizziness, melena, rectal bleeding.  Review of systems are otherwise negative  Past Medical History:  Diagnosis Date  . Arthritis   . Cancer ([place])    rectal  . Diabetes mellitus without complication ([place])   . Hypertension    Past Surgical History:  Procedure Laterality Date  . BACK SURGERY    . CHOLECYSTECTOMY    . COLOSTOMY    . COLOSTOMY     Social History:   reports that he has never smoked. He has never used smokeless tobacco. He reports that he does not drink alcohol or use drugs. Patient lives at home  Allergies  Allergen Reactions  . Sulfa Antibiotics Nausea Only    No family history on file.  No family history of compression fracture  Prior to Admission medications   Medication Sig Start Date End Date Taking? Authorizing Provider  Cyanocobalamin (VITAMIN B 12 PO) Take 1 tablet by mouth daily.     [provider]  glyBURIDE-metformin (GLUCOVANCE) 5-500 MG tablet Take 1 tablet by mouth daily. 02/28/15   [provider]  ibuprofen (ADVIL,MOTRIN) 200 MG tablet Take 400 mg by mouth every 6 (six) hours as needed for mild pain or moderate pain.    [provider]  lisinopril (PRINIVIL,ZESTRIL) 5 MG tablet Take 5 mg by mouth daily. 07/06/17   [provider]  metFORMIN (GLUCOPHAGE) 500 MG tablet Take 500 mg by mouth 2 (two) times daily. 11/26/17   [provider]  naproxen sodium (ALEVE) 220 MG tablet Take 220 mg by mouth daily as needed (for pain).    [provider]  pravastatin (PRAVACHOL) 20 MG tablet Take 10 mg by mouth at bedtime.  05/07/15   [provider]  Saw Palmetto 450 MG CAPS Take 450 mg by mouth 2 (two) times daily.    [provider]  tamsulosin (FLOMAX) 0.4 MG CAPS capsule Take  1 capsule (0.4 mg total) by mouth daily. 07/30/17   Virgel Manifold, MD  traMADol (ULTRAM) 50 MG tablet Take 50 mg by mouth every 6 (six) hours as needed for moderate pain.     [provider]  Vitamin D, Cholecalciferol, 1000 UNITS TABS Take 1,000 Units by mouth daily.    [provider]    Physical Exam: BP 134/69   Pulse 70   Temp (!) 97.1 F (36.2 C) (Oral)   Resp 18   Ht 5\' 8"  (1.727 m)   Wt 78 kg (172 lb)   SpO2 99%   BMI 26.15 kg/m   . General: Elderly Caucasian male. Awake and alert and oriented x3. No acute cardiopulmonary distress.  Marland Kitchen HEENT:  Normocephalic atraumatic.  Right and left ears normal in appearance.  Pupils equal, round, reactive to light. Extraocular muscles are intact. Sclerae anicteric and noninjected.  Moist mucosal membranes. No mucosal lesions.  . Neck: Neck supple without lymphadenopathy. No carotid bruits. No masses palpated.  . Cardiovascular: Regular rate with normal S1-S2 sounds. No murmurs, rubs, gallops auscultated. No JVD.  Marland Kitchen Respiratory: Good respiratory effort with no wheezes, rales, rhonchi. Lungs clear to auscultation bilaterally.  No accessory muscle use. . Abdomen: Soft, nontender, nondistended. Active bowel sounds. No masses or hepatosplenomegaly  . Skin: No rashes, lesions, or ulcerations.  Dry, warm to touch. 2+ dorsalis pedis and radial pulses. . Musculoskeletal: No calf or leg pain. All major joints not erythematous nontender.  No upper or lower joint deformation.  Good ROM.  No contractures  . Psychiatric: Intact judgment and insight. Pleasant and cooperative. . Neurologic: Positive straight leg raise.  Strength is 4 out of 5 in lower extremities bilaterally.  Feet bilaterally. Cranial nerves II through XII are grossly intact.           Labs on Admission: I have personally reviewed following labs and imaging studies  CBC: Recent Labs  Lab 12/26/17 1335  WBC 8.9  NEUTROABS 5.3  HGB 12.4*  HCT 37.5*  MCV 89.1  PLT 462   Basic Metabolic Panel: Recent Labs  Lab 12/26/17 1335  NA 138  K 3.9  CL 105  CO2 23  GLUCOSE 83  BUN 14  CREATININE 0.84  CALCIUM 9.2   GFR: Estimated Creatinine Clearance: 66.7 mL/min (by C-G formula based on SCr of 0.84 mg/dL). Liver Function Tests: Recent Labs  Lab 12/26/17 1335  AST 22  ALT 19  ALKPHOS 98  BILITOT 1.0  PROT 6.6  ALBUMIN 3.4*   No results for input(s): LIPASE, AMYLASE in the last 168 hours. No results for input(s): AMMONIA in the last 168 hours. Coagulation Profile: No results for input(s): INR, PROTIME in the last 168  hours. Cardiac Enzymes: No results for input(s): CKTOTAL, CKMB, CKMBINDEX, TROPONINI in the last 168 hours. BNP (last 3 results) No results for input(s): PROBNP in the last 8760 hours. HbA1C: No results for input(s): HGBA1C in the last 72 hours. CBG: No results for input(s): GLUCAP in the last 168 hours. Lipid Profile: No results for input(s): CHOL, HDL, LDLCALC, TRIG, CHOLHDL, LDLDIRECT in the last 72 hours. Thyroid Function Tests: No results for input(s): TSH, T4TOTAL, FREET4, T3FREE, THYROIDAB in the last 72 hours. Anemia Panel: No results for input(s): VITAMINB12, FOLATE, FERRITIN, TIBC, IRON, RETICCTPCT in the last 72 hours. Urine analysis:    Component Value Date/Time   COLORURINE YELLOW 12/26/2017 1320   APPEARANCEUR CLEAR 12/26/2017 1320   LABSPEC 1.019 12/26/2017 1320   PHURINE  6.0 12/26/2017 1320   GLUCOSEU NEGATIVE 12/26/2017 1320   HGBUR NEGATIVE 12/26/2017 [place] 12/26/2017 1320   KETONESUR NEGATIVE 12/26/2017 1320   PROTEINUR NEGATIVE 12/26/2017 1320   NITRITE NEGATIVE 12/26/2017 1320   LEUKOCYTESUR NEGATIVE 12/26/2017 1320   Sepsis Labs: @LABRCNTIP (procalcitonin:4,lacticidven:4) )No results found for this or any previous visit (from the past 240 hour(s)).   Radiological Exams on Admission: Ct Abdomen Pelvis W Contrast  Result Date: 12/26/2017 CLINICAL DATA:  Worsening abdominal and back pain. Personal history of rectal carcinoma. EXAM: CT ABDOMEN AND PELVIS WITH CONTRAST TECHNIQUE: Multidetector CT imaging of the abdomen and pelvis was performed using the standard protocol following bolus administration of intravenous contrast. CONTRAST:  11mL ISOVUE-300 IOPAMIDOL (ISOVUE-300) INJECTION 61% COMPARISON:  PET-CT on 01/15/2007 FINDINGS: Lower Chest: No acute findings. Hepatobiliary: No hepatic masses identified. Prior cholecystectomy. No evidence of biliary obstruction. Pancreas:  No mass or inflammatory changes. Spleen: Within normal limits in  size and appearance. Adrenals/Urinary Tract: No masses identified. Bilateral lower pole renal cyst noted. No evidence of hydronephrosis. Stomach/Bowel: Postop changes from previous sigmoid colectomy with left lower quadrant colostomy. A large peristomal hernia is again seen containing sigmoid colon. No evidence of diverticulitis or bowel obstruction. Presacral surgical clips in soft tissue density remains stable, consistent with post treatment changes. Vascular/Lymphatic: No pathologically enlarged lymph nodes. No abdominal aortic aneurysm. Aortic atherosclerosis. Reproductive:  No mass or other significant abnormality. Other:  None. Musculoskeletal: No suspicious bone lesions identified. Lumbar spine degenerative changes and severe left hip osteoarthritis noted. IMPRESSION: No acute findings. Stable post treatment changes in presacral region. No evidence of recurrent or metastatic carcinoma. Stable left lower quadrant colostomy with large peristomal hernia containing colon. Electronically Signed   By: Earle Gell M.D.   On: 12/26/2017 16:40   Ct L-spine No Charge  Result Date: 12/26/2017 CLINICAL DATA:  Chronic low back pain.  Rectal cancer. EXAM: CT LUMBAR SPINE WITHOUT CONTRAST TECHNIQUE: Multidetector CT imaging of the lumbar spine was performed without intravenous contrast administration. Multiplanar CT image reconstructions were also generated. Images were generated from the same data set as the CT abdomen and pelvis of the same day. COMPARISON:  Lumbar spine radiographs 03/22/2017 FINDINGS: Segmentation: 5 non rib-bearing lumbar type vertebral bodies are present. The lowest fully formed vertebral body is L5. Alignment: AP alignment is anatomic. Levoconvex curvature is centered at L2-3. Vertebrae: Vertebral body heights are maintained. Endplate sclerotic changes are noted on the right at L3-4. No focal lytic or blastic lesions are present. Paraspinal and other soft tissues: Atherosclerotic calcifications  are present in the aorta. Renal cystic changes are present bilaterally. Please see dedicated CT abdomen report from the same day. Disc levels: There is acquired fusion across the disc space at L1-2 and L2-3. There is fusion across the disc space at L5-S1. Vacuum disc is present at L3-4 and L4-5. Right foraminal narrowing is present at L3-4 due to a rightward disc protrusion and asymmetric facet disease. Moderate central canal stenosis is present at L4-5 with left greater than right subarticular narrowing. Moderate foraminal stenosis is present bilaterally at L4-5 as well. Bone spurs contribute to moderate left subarticular and foraminal stenosis at L5-S1. IMPRESSION: 1. No acute abnormality. 2. Moderate right foraminal stenosis at L3-4 due to a rightward disc protrusion and facet hypertrophy. 3. Moderate central canal narrowing with left greater than right subarticular narrowing and right greater than left moderate foraminal stenosis at L4-5. 4. Moderate left subarticular and foraminal stenosis at L5-S1 secondary to  bone spurs and facet hypertrophy. Electronically Signed   By: San Morelle M.D.   On: 12/26/2017 16:16     Assessment/Plan: Principal Problem:   Failure to thrive in adult Active Problems:   Hypertension   Diabetes mellitus without complication ([place])   Acute low back pain with radicular symptoms, duration less than 6 weeks    This patient was discussed with the ED physician, including pertinent vitals, physical exam findings, labs, and imaging.  We also discussed care given by the ED provider.  1. Failure to thrive in adult a. Observation b. Secondary to inability to care for self with back pain 2. Acute back pain with radicular symptoms a. We will start the patient on prednisone for presumed bulging disc and nerve impingement b. We will get MRI  c. Social work consult for skilled nursing facility placement. d. PT eval and treat 3. Diabetes a. Hold metformin due to IV  contrast b. Continue glyburide c. Sliding scale insulin 4. Hypertension a. Antihypertensives   DVT prophylaxis: Lovenox  Consultants: None wife Code Status: DNR Family Communication: Wife, niece, daughter present for evaluation Disposition Plan: Pending   Jacob Moores [facility] Pager [phone]  If 7PM-7AM, please contact night-coverage [email or web address] Password [email or web address]

## 2017-12-27 DIAGNOSIS — M1990 Unspecified osteoarthritis, unspecified site: Secondary | ICD-10-CM

## 2017-12-27 DIAGNOSIS — M5410 Radiculopathy, site unspecified: Secondary | ICD-10-CM | POA: Diagnosis not present

## 2017-12-27 DIAGNOSIS — M545 Low back pain: Secondary | ICD-10-CM | POA: Diagnosis not present

## 2017-12-27 DIAGNOSIS — R627 Adult failure to thrive: Secondary | ICD-10-CM | POA: Diagnosis not present

## 2017-12-27 LAB — GLUCOSE, CAPILLARY
GLUCOSE-CAPILLARY: 123 mg/dL — AB (ref 65–99)
GLUCOSE-CAPILLARY: 101 mg/dL — AB (ref 65–99)
Glucose-Capillary: 155 mg/dL — ABNORMAL HIGH (ref 65–99)
Glucose-Capillary: 154 mg/dL — ABNORMAL HIGH (ref 65–99)

## 2017-12-27 MED ORDER — SENNOSIDES-DOCUSATE SODIUM 8.6-50 MG PO TABS
1.0000 | ORAL_TABLET | Freq: Two times a day (BID) | ORAL | Status: DC
  Administered 2017-12-28 – 2017-12-30 (×3): 1 via ORAL
  Filled 2017-12-27 (×5): qty 1

## 2017-12-27 NOTE — Progress Notes (Addendum)
PROGRESS NOTE    Ralph Yang  NTI:[id number]  DOB: 07-04-1934  DOA: 12/26/2017 PCP: Celene Squibb, MD   Brief Admission Hx: Ralph Yang is a 84 y.o. male with a history of diabetes, hypertension, arthritis, history of rectal cancer.  Patient seen for debilitating, worsening back pain.  Patient has always had a history of back pain that is been evaluated by Ortho in the past.  He did not have any scans done other than x-rays.  The orthopedist noted significant arthritis.  His back pain is been worsening over the past 3 to 4 days to the point where he has extreme pain to walk.  He sits most of the time in his chair because the pain.  His wife is been unable to take care of him, as well as another family member in the home.  EDP noted that the patient appeared quite disheveled and unkept upon arrival.  Patient has not had a bath or shower in several days.  Pain is increasing.  Pain radiates down both legs to the back of the knee.  Patient has been increasingly weak.  Also notes numbness in lower legs and feet.  Pain worse with ambulation and improved with rest.  In the ED; the EDP ordered CT scan. Results of back and pelvis normal.  Labs are normal.   MDM/Assessment & Plan:   Principal Problem:   Failure to thrive in adult Active Problems:   Hypertension   Diabetes mellitus without complication ([facility])   Acute low back pain with radicular symptoms, duration less than 6 weeks    This patient was discussed with the ED physician, including pertinent vitals, physical exam findings, labs, and imaging.  We also discussed care given by the ED provider.  1. Failure to thrive in adult - patient placed in observation with inability to care for himself with back pain. Consults with PT and OT to eval and treat. Consult to Education officer, museum for placement. 2. Acute back pain with radicular symptoms - Patient started on prednisone for bulging disc and nerve impingement due to DDD. Patient declines MRI  at this time and declines surgical intervention. 3. Diabetes - Hold home oral medications. Patient placed on sliding scale insulin while in-patient. 4. Hypertension - Continue home medication for hypertension.   DVT prophylaxis: Lovenox Code Status: DNR Family Communication: Patient Disposition Plan: Pending PT evaluation.   Consultants:  Physical Therapy  Occupational Therapy  Social Worker for assisted living placement.  Subjective: I feel better today. The pain isn't radiating down my legs.  Objective: Vitals:   12/26/17 1743 12/26/17 1756 12/26/17 2053 12/27/17 0532  BP: (!) 130/59 (!) 154/82 (!) 144/64 123/78  Pulse: 73 88 75 80  Resp: 18 20 15 17   Temp: (!) 97.5 F (36.4 C) 97.8 F (36.6 C) (!) 97.5 F (36.4 C) 97.7 F (36.5 C)  TempSrc: Oral Oral Oral Oral  SpO2: 100% 100% 94% 97%  Weight:  73.6 kg (162 lb 4.1 oz)    Height:  5' 8.5" (1.74 m)      Intake/Output Summary (Last 24 hours) at 12/27/2017 1045 Last data filed at 12/27/2017 0800 Gross per 24 hour  Intake 480 ml  Output 400 ml  Net 80 ml   Filed Weights   12/26/17 1257 12/26/17 1756  Weight: 78 kg (172 lb) 73.6 kg (162 lb 4.1 oz)     REVIEW OF SYSTEMS  As per history otherwise all reviewed and reported negative  Exam:  General  exam: Elderly male, awake and alert x 3. Calm and cooperative. No acute distress. Respiratory system: Clear. No increased work of breathing. Cardiovascular system: S1 & S2 heard, RRR. No JVD, murmurs, gallops, clicks or pedal edema. Gastrointestinal system: Abdomen is nondistended, soft and nontender. Normal bowel sounds heard. Central nervous system: Positive right straight leg raise. Strength 5 out of 5 in lower extermities. Cranial nerves II through XII are grossly intact. Alert and oriented. No focal neurological deficits. Extremities: no CCE.  Data Reviewed: Basic Metabolic Panel: Recent Labs  Lab 12/26/17 1335  NA 138  K 3.9  CL 105  CO2 23  GLUCOSE 83    BUN 14  CREATININE 0.84  CALCIUM 9.2   Liver Function Tests: Recent Labs  Lab 12/26/17 1335  AST 22  ALT 19  ALKPHOS 98  BILITOT 1.0  PROT 6.6  ALBUMIN 3.4*   No results for input(s): LIPASE, AMYLASE in the last 168 hours. No results for input(s): AMMONIA in the last 168 hours. CBC: Recent Labs  Lab 12/26/17 1335  WBC 8.9  NEUTROABS 5.3  HGB 12.4*  HCT 37.5*  MCV 89.1  PLT 194   Cardiac Enzymes: No results for input(s): CKTOTAL, CKMB, CKMBINDEX, TROPONINI in the last 168 hours. CBG (last 3)  Recent Labs    12/26/17 2056 12/27/17 0722  GLUCAP 148* 155*   No results found for this or any previous visit (from the past 240 hour(s)).   Studies: Ct Abdomen Pelvis W Contrast  Result Date: 12/26/2017 CLINICAL DATA:  Worsening abdominal and back pain. Personal history of rectal carcinoma. EXAM: CT ABDOMEN AND PELVIS WITH CONTRAST TECHNIQUE: Multidetector CT imaging of the abdomen and pelvis was performed using the standard protocol following bolus administration of intravenous contrast. CONTRAST:  124mL ISOVUE-300 IOPAMIDOL (ISOVUE-300) INJECTION 61% COMPARISON:  PET-CT on 01/15/2007 FINDINGS: Lower Chest: No acute findings. Hepatobiliary: No hepatic masses identified. Prior cholecystectomy. No evidence of biliary obstruction. Pancreas:  No mass or inflammatory changes. Spleen: Within normal limits in size and appearance. Adrenals/Urinary Tract: No masses identified. Bilateral lower pole renal cyst noted. No evidence of hydronephrosis. Stomach/Bowel: Postop changes from previous sigmoid colectomy with left lower quadrant colostomy. A large peristomal hernia is again seen containing sigmoid colon. No evidence of diverticulitis or bowel obstruction. Presacral surgical clips in soft tissue density remains stable, consistent with post treatment changes. Vascular/Lymphatic: No pathologically enlarged lymph nodes. No abdominal aortic aneurysm. Aortic atherosclerosis. Reproductive:  No  mass or other significant abnormality. Other:  None. Musculoskeletal: No suspicious bone lesions identified. Lumbar spine degenerative changes and severe left hip osteoarthritis noted. IMPRESSION: No acute findings. Stable post treatment changes in presacral region. No evidence of recurrent or metastatic carcinoma. Stable left lower quadrant colostomy with large peristomal hernia containing colon. Electronically Signed   By: Earle Gell M.D.   On: 12/26/2017 16:40   Ct L-spine No Charge  Result Date: 12/26/2017 CLINICAL DATA:  Chronic low back pain.  Rectal cancer. EXAM: CT LUMBAR SPINE WITHOUT CONTRAST TECHNIQUE: Multidetector CT imaging of the lumbar spine was performed without intravenous contrast administration. Multiplanar CT image reconstructions were also generated. Images were generated from the same data set as the CT abdomen and pelvis of the same day. COMPARISON:  Lumbar spine radiographs 03/22/2017 FINDINGS: Segmentation: 5 non rib-bearing lumbar type vertebral bodies are present. The lowest fully formed vertebral body is L5. Alignment: AP alignment is anatomic. Levoconvex curvature is centered at L2-3. Vertebrae: Vertebral body heights are maintained. Endplate sclerotic changes are noted  on the right at L3-4. No focal lytic or blastic lesions are present. Paraspinal and other soft tissues: Atherosclerotic calcifications are present in the aorta. Renal cystic changes are present bilaterally. Please see dedicated CT abdomen report from the same day. Disc levels: There is acquired fusion across the disc space at L1-2 and L2-3. There is fusion across the disc space at L5-S1. Vacuum disc is present at L3-4 and L4-5. Right foraminal narrowing is present at L3-4 due to a rightward disc protrusion and asymmetric facet disease. Moderate central canal stenosis is present at L4-5 with left greater than right subarticular narrowing. Moderate foraminal stenosis is present bilaterally at L4-5 as well. Bone spurs  contribute to moderate left subarticular and foraminal stenosis at L5-S1. IMPRESSION: 1. No acute abnormality. 2. Moderate right foraminal stenosis at L3-4 due to a rightward disc protrusion and facet hypertrophy. 3. Moderate central canal narrowing with left greater than right subarticular narrowing and right greater than left moderate foraminal stenosis at L4-5. 4. Moderate left subarticular and foraminal stenosis at L5-S1 secondary to bone spurs and facet hypertrophy. Electronically Signed   By: San Morelle M.D.   On: 12/26/2017 16:16     Scheduled Meds: . enoxaparin (LOVENOX) injection  40 mg Subcutaneous Q24H  . insulin aspart  0-15 Units Subcutaneous TID WC  . insulin aspart  0-5 Units Subcutaneous QHS  . lisinopril  5 mg Oral Daily  . pravastatin  10 mg Oral QHS  . predniSONE  40 mg Oral Q breakfast  . senna-docusate  1 tablet Oral BID  . tamsulosin  0.4 mg Oral Daily   Continuous Infusions:  Principal Problem:   Failure to thrive in adult Active Problems:   Hypertension   Diabetes mellitus without complication ([facility])   Acute low back pain with radicular symptoms, duration less than 6 weeks   Time spent:   Ashok Norris, Student AGACNP  Attending:  Irwin Brakeman, MD, FAAFP [facility] Pager [phone]  If 7PM-7AM, please contact night-coverage [email or web address] Password [email or web address] 12/27/2017, 10:45 AM    LOS: 0 days

## 2017-12-28 DIAGNOSIS — I10 Essential (primary) hypertension: Secondary | ICD-10-CM

## 2017-12-28 DIAGNOSIS — M545 Low back pain: Secondary | ICD-10-CM | POA: Diagnosis not present

## 2017-12-28 DIAGNOSIS — M5441 Lumbago with sciatica, right side: Secondary | ICD-10-CM | POA: Diagnosis not present

## 2017-12-28 DIAGNOSIS — M5442 Lumbago with sciatica, left side: Secondary | ICD-10-CM | POA: Diagnosis not present

## 2017-12-28 DIAGNOSIS — E119 Type 2 diabetes mellitus without complications: Secondary | ICD-10-CM | POA: Diagnosis not present

## 2017-12-28 DIAGNOSIS — R627 Adult failure to thrive: Secondary | ICD-10-CM | POA: Diagnosis not present

## 2017-12-28 LAB — GLUCOSE, CAPILLARY
GLUCOSE-CAPILLARY: 227 mg/dL — AB (ref 65–99)
GLUCOSE-CAPILLARY: 140 mg/dL — AB (ref 65–99)
Glucose-Capillary: 126 mg/dL — ABNORMAL HIGH (ref 65–99)
Glucose-Capillary: 189 mg/dL — ABNORMAL HIGH (ref 65–99)

## 2017-12-28 NOTE — Progress Notes (Signed)
PROGRESS NOTE    Ralph Yang  GYB:[id number] DOB: [birth year] DOA: 12/26/2017 PCP: Celene Squibb, MD    Brief Narrative:  84 year old male with history of hypertension diabetes, admitted to the hospital with worsening back pain and ability to walk.  Started on prednisone for degenerative disc disease.  CT imaging was unrevealing.  Seen by physical therapy recommended skilled nursing facility placement.   Assessment & Plan:   Principal Problem:   Failure to thrive in adult Active Problems:   Hypertension   Diabetes mellitus without complication ([place])   Acute bilateral low back pain without sciatica   1. Acute lower back pain with radicular symptoms.  CT of the lumbar spine showed moderate degeneration.  Patient refused to undergo any MRI and have any surgical evaluation.  He is been started on prednisone and pain management with some improvement of his symptoms.  Seen by physical therapy who recommended skilled nursing facility placement. 2. Diabetes.  Blood sugars are running higher due to prednisone.  Continue to monitor with sliding scale insulin. 3. BPH.  Continue on Flomax. 4. Hypertension.  Blood pressure currently stable.  Continue lisinopril 5. Hyperlipidemia.  Continue statin.   DVT prophylaxis: Lovenox Code Status: DNR Family Communication: Discussed with family at the bedside Disposition Plan: discharge to SNF when bed available   Consultants:     Procedures:     Antimicrobials:       Subjective: Feeling better today.  Back pain is better.  Able to ambulate to the door with physical therapy.  Objective: Vitals:   12/27/17 1300 12/27/17 2100 12/28/17 0601 12/28/17 1426  BP: 139/79 134/81 (!) 149/86 139/69  Pulse: 72 72 73 73  Resp: 18 16 16 18   Temp: 97.7 F (36.5 C) 97.7 F (36.5 C) 97.8 F (36.6 C)   TempSrc: Oral Oral Oral   SpO2: 99% 97% 97% 98%  Weight:      Height:        Intake/Output Summary (Last 24 hours) at 12/28/2017 1849 Last  data filed at 12/28/2017 1634 Gross per 24 hour  Intake 580 ml  Output 600 ml  Net -20 ml   Filed Weights   12/26/17 1257 12/26/17 1756  Weight: 78 kg (172 lb) 73.6 kg (162 lb 4.1 oz)    Examination:  General exam: Appears calm and comfortable  Respiratory system: Clear to auscultation. Respiratory effort normal. Cardiovascular system: S1 & S2 heard, RRR. No JVD, murmurs, rubs, gallops or clicks. No pedal edema. Gastrointestinal system: Abdomen is nondistended, soft and nontender. No organomegaly or masses felt. Normal bowel sounds heard. Central nervous system: Alert and oriented. No focal neurological deficits. Extremities: Symmetric 5 x 5 power. Skin: No rashes, lesions or ulcers Psychiatry: Judgement and insight appear normal. Mood & affect appropriate.     Data Reviewed: I have personally reviewed following labs and imaging studies  CBC: Recent Labs  Lab 12/26/17 1335  WBC 8.9  NEUTROABS 5.3  HGB 12.4*  HCT 37.5*  MCV 89.1  PLT 876   Basic Metabolic Panel: Recent Labs  Lab 12/26/17 1335  NA 138  K 3.9  CL 105  CO2 23  GLUCOSE 83  BUN 14  CREATININE 0.84  CALCIUM 9.2   GFR: Estimated Creatinine Clearance: 67.9 mL/min (by C-G formula based on SCr of 0.84 mg/dL). Liver Function Tests: Recent Labs  Lab 12/26/17 1335  AST 22  ALT 19  ALKPHOS 98  BILITOT 1.0  PROT 6.6  ALBUMIN 3.4*   No  results for input(s): LIPASE, AMYLASE in the last 168 hours. No results for input(s): AMMONIA in the last 168 hours. Coagulation Profile: No results for input(s): INR, PROTIME in the last 168 hours. Cardiac Enzymes: No results for input(s): CKTOTAL, CKMB, CKMBINDEX, TROPONINI in the last 168 hours. BNP (last 3 results) No results for input(s): PROBNP in the last 8760 hours. HbA1C: No results for input(s): HGBA1C in the last 72 hours. CBG: Recent Labs  Lab 12/27/17 1628 12/27/17 2048 12/28/17 0721 12/28/17 1134 12/28/17 1631  GLUCAP 101* 154* 126* 189* 227*    Lipid Profile: No results for input(s): CHOL, HDL, LDLCALC, TRIG, CHOLHDL, LDLDIRECT in the last 72 hours. Thyroid Function Tests: No results for input(s): TSH, T4TOTAL, FREET4, T3FREE, THYROIDAB in the last 72 hours. Anemia Panel: No results for input(s): VITAMINB12, FOLATE, FERRITIN, TIBC, IRON, RETICCTPCT in the last 72 hours. Sepsis Labs: No results for input(s): PROCALCITON, LATICACIDVEN in the last 168 hours.  No results found for this or any previous visit (from the past 240 hour(s)).       Radiology Studies: No results found.      Scheduled Meds: . enoxaparin (LOVENOX) injection  40 mg Subcutaneous Q24H  . insulin aspart  0-15 Units Subcutaneous TID WC  . insulin aspart  0-5 Units Subcutaneous QHS  . lisinopril  5 mg Oral Daily  . pravastatin  10 mg Oral QHS  . predniSONE  40 mg Oral Q breakfast  . senna-docusate  1 tablet Oral BID  . tamsulosin  0.4 mg Oral Daily   Continuous Infusions:   LOS: 0 days    Time spent: 39mins    Kathie Dike, MD [facility] Pager [phone]  If 7PM-7AM, please contact night-coverage [email or web address] Password [facility] 12/28/2017, 6:49 PM

## 2017-12-28 NOTE — Evaluation (Signed)
Physical Therapy Evaluation Patient Details Name: Ralph Yang MRN: [id number] DOB: 30-Aug-1933 Today's Date: 12/28/2017   History of Present Illness  Ralph Yang is a 84 y.o. male with a history of diabetes, hypertension, arthritis, history of rectal cancer.  Patient seen for debilitating, worsening back pain.  Patient has always had a history of back pain that is been evaluated by Ortho in the past.  He did not have any scans done other than x-rays.  The orthopedist noted significant arthritis.  His back pain is been worsening over the past 3 to 4 days to the point where he has extreme pain to walk.  He sits most of the time in his chair because the pain.  His wife is been unable to take care of him, as well as another family member in the home.      Clinical Impression  Patient limited for functional mobility and gait as stated below secondary to BLE weakness, fatigue and poor standing balance.  Patient presents up in chair and had difficulty with supine to sitting when bed flat due to low back and bilateral hip pain, patient usually sleeps with back raised at home.  Patient will benefit from continued physical therapy in hospital and recommended venue below to increase strength, balance, endurance for safe ADLs and gait.     Follow Up Recommendations SNF;Supervision/Assistance - 24 hour    Equipment Recommendations  None recommended by PT    Recommendations for Other Services       Precautions / Restrictions Precautions Precautions: Fall Restrictions Weight Bearing Restrictions: No      Mobility  Bed Mobility Overal bed mobility: Needs Assistance Bed Mobility: Supine to Sit;Sit to Supine     Supine to sit: Min assist(with bed flat) Sit to supine: Modified independent (Device/Increase time)   General bed mobility comments: increased time  Transfers Overall transfer level: Needs assistance Equipment used: Rolling walker (2 wheeled) Transfers: Sit to/from  [facility] Sit to Stand: Min assist Stand pivot transfers: Min assist          Ambulation/Gait Ambulation/Gait assistance: Min assist;Mod assist Ambulation Distance (Feet): 16 Feet Assistive device: Rolling walker (2 wheeled) Gait Pattern/deviations: Decreased step length - right;Decreased step length - left;Decreased stride length Gait velocity: slow   General Gait Details: demonstrates slow labored unsteady cadence, no loss of balance, limited secondary to fatigue, fair/poor standing balance  Stairs            Wheelchair Mobility    Modified Rankin (Stroke Patients Only)       Balance Overall balance assessment: Needs assistance Sitting-balance support: Feet supported;No upper extremity supported Sitting balance-Leahy Scale: Good     Standing balance support: Bilateral upper extremity supported;During functional activity Standing balance-Leahy Scale: Fair Standing balance comment: using RW                             Pertinent Vitals/Pain Pain Assessment: 0-10 Pain Score: 7  Pain Location: chronic low back bilateral hips Pain Descriptors / Indicators: Constant;Aching Pain Intervention(s): Limited activity within patient's tolerance;Monitored during session    [place] expects to be discharged to:: Private residence Living Arrangements: Spouse/significant other Available Help at Discharge: Family;Available PRN/intermittently Type of Home: House Home Access: Stairs to enter Entrance Stairs-Rails: Right;Left;Can reach both Entrance Stairs-Number of Steps: 2 Home Layout: One level Home Equipment: Walker - 4 wheels;Wheelchair - power;Shower seat;Cane - single point  Prior Function Level of Independence: Needs assistance   Gait / Transfers Assistance Needed: household ambulator with Rollator  ADL's / Homemaking Assistance Needed: assisted by family  Comments: Patient uses lift chair, and 4 wheeled  walker, sponge bathes as he is unable to get into the shower. patient uses a urinal and has a colostomy.      Hand Dominance   Dominant Hand: Right    Extremity/Trunk Assessment   Upper Extremity Assessment Upper Extremity Assessment: Defer to OT evaluation    Lower Extremity Assessment Lower Extremity Assessment: Generalized weakness    Cervical / Trunk Assessment Cervical / Trunk Assessment: Kyphotic  Communication   Communication: No difficulties  Cognition Arousal/Alertness: Awake/alert Behavior During Therapy: WFL for tasks assessed/performed Overall Cognitive Status: Within Functional Limits for tasks assessed                                        General Comments      Exercises     Assessment/Plan    PT Assessment Patient needs continued PT services  PT Problem List Decreased strength;Decreased activity tolerance;Decreased balance;Decreased mobility       PT Treatment Interventions Gait training;Functional mobility training;Therapeutic activities;Therapeutic exercise;Patient/family education    PT Goals (Current goals can be found in the Care Plan section)  Acute Rehab PT Goals Patient Stated Goal: get stronger before returning home PT Goal Formulation: With patient Time For Goal Achievement: 01/11/18 Potential to Achieve Goals: Good    Frequency Min 3X/week   Barriers to discharge        Co-evaluation               AM-PAC PT "6 Clicks" Daily Activity  Outcome Measure Difficulty turning over in bed (including adjusting bedclothes, sheets and blankets)?: None Difficulty moving from lying on back to sitting on the side of the bed? : A Little Difficulty sitting down on and standing up from a chair with arms (e.g., wheelchair, bedside commode, etc,.)?: A Little Help needed moving to and from a bed to chair (including a wheelchair)?: A Little Help needed walking in hospital room?: A Lot Help needed climbing 3-5 steps with a  railing? : A Lot 6 Click Score: 17    End of Session   Activity Tolerance: Patient tolerated treatment well;Patient limited by fatigue Patient left: in bed;with call bell/phone within reach Nurse Communication: Mobility status PT Visit Diagnosis: Unsteadiness on feet (R26.81);Other abnormalities of gait and mobility (R26.89);Muscle weakness (generalized) (M62.81)    Time: 1856-3149 PT Time Calculation (min) (ACUTE ONLY): 27 min   Charges:   PT Evaluation $PT Eval Moderate Complexity: 1 Mod PT Treatments $Therapeutic Activity: 23-37 mins   PT G Codes:        12:12 PM, 24-Jan-2018 Lonell Grandchild, MPT Physical Therapist with [facility] [phone] office [phone] mobile phone

## 2017-12-28 NOTE — Evaluation (Signed)
Occupational Therapy Evaluation Patient Details Name: Ralph Yang MRN: [id number] DOB: March 21, 1934 Today's Date: 12/28/2017    History of Present Illness Ralph Yang is a 84 y.o. male with a history of diabetes, hypertension, arthritis, history of rectal cancer.  Patient seen for debilitating, worsening back pain.  Patient has always had a history of back pain that is been evaluated by Ortho in the past.  He did not have any scans done other than x-rays.  The orthopedist noted significant arthritis.  His back pain is been worsening over the past 3 to 4 days to the point where he has extreme pain to walk.  He sits most of the time in his chair because the pain.  His wife is been unable to take care of him, as well as another family member in the home.     Clinical Impression   Pt in bed upon therapy arrival and agreeable to participate in OT evaluation. Patient appears to be presenting at baseline for majority of basic ADL tasks. He reports that his wife is in poor health and their son has back problems as well so they are unable to provide too much physical assistance at home. He states that his back pain has made it more difficult to get up and walk and complete daily tasks. Patient performs very limited ambulation in his home and remains in the recliner most of the time. I recommend that patient discharge to SNF for short term rehab before returning home. If his family is unable to provided any assistance at home, he may need to think about long term placement or more assistance at home from an outside source.     Follow Up Recommendations  SNF    Equipment Recommendations  None recommended by OT       Precautions / Restrictions Precautions Precautions: Fall Restrictions Weight Bearing Restrictions: No      Mobility Bed Mobility Overal bed mobility: Modified Independent                       ADL either performed or assessed with clinical judgement   ADL Overall  ADL's : At baseline;Needs assistance/impaired                     Lower Body Dressing: Total assistance;Bed level               Functional mobility during ADLs: Minimal assistance;Rolling walker;Cueing for sequencing;Cueing for safety       Vision Baseline Vision/History: No visual deficits Patient Visual Report: No change from baseline              Pertinent Vitals/Pain Pain Assessment: 0-10 Pain Score: 8  Pain Location: lower back and bilateral hips Pain Descriptors / Indicators: Constant;Aching Pain Intervention(s): Monitored during session;Premedicated before session     Hand Dominance Right   Extremity/Trunk Assessment Upper Extremity Assessment Upper Extremity Assessment: Overall WFL for tasks assessed   Lower Extremity Assessment Lower Extremity Assessment: Defer to PT evaluation       Communication Communication Communication: No difficulties   Cognition Arousal/Alertness: Awake/alert Behavior During Therapy: WFL for tasks assessed/performed Overall Cognitive Status: Within Functional Limits for tasks assessed                      Home Living Family/patient expects to be discharged to:: Skilled nursing facility Living Arrangements: Spouse/significant other(Son is at home although unable to provide physical assistance due to back  pain)          Prior Functioning/Environment Level of Independence: Needs assistance  Gait / Transfers Assistance Needed: Pt would ambulate short distancees. Spend most of his time in his lift chair.  ADL's / Homemaking Assistance Needed: Patient required Moderate assistance for dressing. He would sit to dress.    Comments: Patient uses lift chair, and 4 wheeled walker, sponge bathes as he is unable to get into the shower. patient uses a urinal and has a colostomy.         OT Problem List: Decreased activity tolerance                          AM-PAC PT "6 Clicks" Daily Activity     Outcome  Measure Help from another person eating meals?: None Help from another person taking care of personal grooming?: None Help from another person toileting, which includes using toliet, bedpan, or urinal?: A Lot Help from another person bathing (including washing, rinsing, drying)?: A Lot Help from another person to put on and taking off regular upper body clothing?: A Little Help from another person to put on and taking off regular lower body clothing?: Total 6 Click Score: 16   End of Session Equipment Utilized During Treatment: Gait belt;Rolling walker  Activity Tolerance: Patient tolerated treatment well Patient left: in chair;with call bell/phone within reach;with chair alarm set  OT Visit Diagnosis: Muscle weakness (generalized) (M62.81)                Time: 8416-6063 OT Time Calculation (min): 26 min Charges:  OT General Charges $OT Visit: 1 Visit OT Evaluation $OT Eval Low Complexity: 1 Low G-Codes:     Ailene Ravel, OTR/L,CBIS  [phone]   Avis Tirone, Clarene Duke 12/28/2017, 10:25 AM

## 2017-12-28 NOTE — Clinical Social Work Note (Signed)
Clinical Social Work Assessment  Patient Details  Name: Ralph Yang MRN: [id number] Date of Birth: [birth year]  Date of referral:  12/28/17               Reason for consult:  Facility Placement, Discharge Planning                Permission sought to share information with:  Chartered certified accountant granted to share information::     Name::        Agency::  [facility]  Relationship::     Contact Information:     Housing/Transportation Living arrangements for the past 2 months:  Single Family Home Source of Information:  Patient Patient Interpreter Needed:  None Criminal Activity/Legal Involvement Pertinent to Current Situation/Hospitalization:  No - Comment as needed Significant Relationships:  Spouse, Other Family Members Lives with:  Spouse, Adult Children Do you feel safe going back to the place where you live?  Yes Need for family participation in patient care:  No ([place])  Care giving concerns: PT/OT recommending SNF rehab.   Social Worker assessment / plan: Pt is an 84 year old male referred to [facility] for SNF placement. Met with pt today to assess. Pt is alert and oriented x4. He is very pleasant and easily engages in conversation. Per pt, he lives with his wife and her son in a single family home in [place]. Pt states that he spends most of his time at home in a recliner due to his severe back pain. He had been able to ambulate with a walker without issue until right before the hospital admission when he started feeling weak. Pt aware that PT/OT recommending short term SNF rehab and he requests referrals to [facility] and [facility] as he would like to stay in [place] as his wife cannot get to see him outside of the city. His wife's son is unable to help with pt's care at home due to his own medical problems. Pt would like to return home upon dc from SNF rehab.  Will start SNF referrals. Pt has [employer] and will need prior authorization from his insurance prior  to transferring to a SNF.  Employment status:  Retired Nurse, adult PT Recommendations:  [place] / Referral to community resources:  [place]  Patient/Family's Response to care: Pt accepting of care.  Patient/Family's Understanding of and Emotional Response to Diagnosis, Current Treatment, and Prognosis: Pt appears to have a good understanding of diagnosis and treatment recommendations. No emotional distress identified.  Emotional Assessment Appearance:  Appears stated age Attitude/Demeanor/Rapport:  Gracious, Engaged Affect (typically observed):  Calm, Pleasant Orientation:  Oriented to Self, Oriented to Place, Oriented to  Time, Oriented to Situation Alcohol / Substance use:  Not Applicable Psych involvement (Current and /or in the community):  No (Comment)  Discharge Needs  Concerns to be addressed:  Discharge Planning Concerns Readmission within the last 30 days:  No Current discharge risk:  Physical Impairment Barriers to Discharge:  Josephine, LCSW 12/28/2017, 11:51 AM

## 2017-12-28 NOTE — NC FL2 (Signed)
[place] MEDICAID FL2 LEVEL OF CARE SCREENING TOOL     IDENTIFICATION  Patient Name: Ralph Yang Birthdate: 09/17/1933 Sex: male Admission Date (Current Location): 12/26/2017  [facility] and [place] Number:  [employer] and Address:  [place] [address] [place]      Provider Number: [id number]  Attending Physician Name and Address:  Kathie Dike, MD  Relative Name and Phone Number:  Mamadou Breon (wife) [phone]    Current Level of Care: Hospital Recommended Level of Care: [place] Prior Approval Number:    Date Approved/Denied:   PASRR Number: [id number]  Discharge Plan: SNF    Current Diagnoses: Patient Active Problem List   Diagnosis Date Noted  . Failure to thrive in adult 12/26/2017  . Acute bilateral low back pain without sciatica 12/26/2017  . Hypertension   . Diabetes mellitus without complication ([place])   . Arthritis   . Cancer ([place])     Orientation RESPIRATION BLADDER Height & Weight     Self, Time, Situation, Place  Normal Incontinent Weight: 162 lb 4.1 oz (73.6 kg) Height:  5' 8.5" (174 cm)  BEHAVIORAL SYMPTOMS/MOOD NEUROLOGICAL BOWEL NUTRITION STATUS      Colostomy Diet(see dc summary)  AMBULATORY STATUS COMMUNICATION OF NEEDS Skin   Extensive Assist Verbally Normal                       Personal Care Assistance Level of Assistance  Bathing, Feeding, Dressing Bathing Assistance: Limited assistance Feeding assistance: Independent Dressing Assistance: Limited assistance     Functional Limitations Info  Sight, Hearing, Speech Sight Info: Adequate Hearing Info: Adequate Speech Info: Adequate    SPECIAL CARE FACTORS FREQUENCY  PT (By licensed PT), OT (By licensed OT)     PT Frequency: 5 times week OT Frequency: 3-5 times week            Contractures Contractures Info: Not present    Additional Factors Info  Code Status, Allergies Code Status Info:  DNR Allergies Info: Sulfa Antibiotics           Current Medications (12/28/2017):  This is the current hospital active medication list Current Facility-Administered Medications  Medication Dose Route Frequency Provider Last Rate Last Dose  . acetaminophen (TYLENOL) tablet 650 mg  650 mg Oral Q6H PRN Truett Mainland, DO       Or  . acetaminophen (TYLENOL) suppository 650 mg  650 mg Rectal Q6H PRN Truett Mainland, DO      . enoxaparin (LOVENOX) injection 40 mg  40 mg Subcutaneous Q24H Truett Mainland, DO   40 mg at 12/27/17 2226  . HYDROcodone-acetaminophen (NORCO/VICODIN) 5-325 MG per tablet 1-2 tablet  1-2 tablet Oral Q4H PRN Truett Mainland, DO   2 tablet at 12/28/17 0756  . insulin aspart (novoLOG) injection 0-15 Units  0-15 Units Subcutaneous TID WC Truett Mainland, DO   3 Units at 12/28/17 1205  . insulin aspart (novoLOG) injection 0-5 Units  0-5 Units Subcutaneous QHS Truett Mainland, DO   Stopped at 12/26/17 2153  . lisinopril (PRINIVIL,ZESTRIL) tablet 5 mg  5 mg Oral Daily Truett Mainland, DO   5 mg at 12/28/17 1006  . ondansetron (ZOFRAN) tablet 4 mg  4 mg Oral Q6H PRN Truett Mainland, DO       Or  . ondansetron [facility]) injection 4 mg  4 mg Intravenous Q6H PRN Truett Mainland, DO      .  pravastatin (PRAVACHOL) tablet 10 mg  10 mg Oral QHS Stinson, Jacob J, DO      . predniSONE (DELTASONE) tablet 40 mg  40 mg Oral Q breakfast Truett Mainland, DO   40 mg at 12/28/17 0755  . senna-docusate (Senokot-S) tablet 1 tablet  1 tablet Oral BID Wynetta Emery, Clanford L, MD   1 tablet at 12/28/17 1006  . tamsulosin (FLOMAX) capsule 0.4 mg  0.4 mg Oral Daily Truett Mainland, DO   0.4 mg at 12/28/17 1005     Discharge Medications: Please see discharge summary for a list of discharge medications.  Relevant Imaging Results:  Relevant Lab Results:   Additional Information SSN: [place], LCSW

## 2017-12-28 NOTE — Plan of Care (Signed)
  Problem: Acute Rehab PT Goals(only PT should resolve) Goal: Pt Will Go Supine/Side To Sit Outcome: Progressing Flowsheets (Taken 12/28/2017 1215) Pt will go Supine/Side to Sit: with modified independence Goal: Patient Will Transfer Sit To/From Stand Outcome: Progressing Flowsheets (Taken 12/28/2017 1215) Patient will transfer sit to/from stand: with modified independence Goal: Pt Will Transfer Bed To Chair/Chair To Bed Outcome: Progressing Flowsheets (Taken 12/28/2017 1215) Pt will Transfer Bed to Chair/Chair to Bed: with modified independence Goal: Pt Will Ambulate Outcome: Progressing Flowsheets (Taken 12/28/2017 1215) Pt will Ambulate: 50 feet;with min guard assist;with rolling walker  12:16 PM, 12/28/17 Lonell Grandchild, MPT Physical Therapist with [facility] [phone] office [phone] mobile phone

## 2017-12-29 DIAGNOSIS — E119 Type 2 diabetes mellitus without complications: Secondary | ICD-10-CM | POA: Diagnosis not present

## 2017-12-29 DIAGNOSIS — I10 Essential (primary) hypertension: Secondary | ICD-10-CM | POA: Diagnosis not present

## 2017-12-29 DIAGNOSIS — R627 Adult failure to thrive: Secondary | ICD-10-CM | POA: Diagnosis not present

## 2017-12-29 DIAGNOSIS — M5441 Lumbago with sciatica, right side: Secondary | ICD-10-CM | POA: Diagnosis not present

## 2017-12-29 DIAGNOSIS — M545 Low back pain: Secondary | ICD-10-CM | POA: Diagnosis not present

## 2017-12-29 DIAGNOSIS — M5442 Lumbago with sciatica, left side: Secondary | ICD-10-CM | POA: Diagnosis not present

## 2017-12-29 LAB — GLUCOSE, CAPILLARY
GLUCOSE-CAPILLARY: 237 mg/dL — AB (ref 65–99)
GLUCOSE-CAPILLARY: 223 mg/dL — AB (ref 65–99)
Glucose-Capillary: 95 mg/dL (ref 65–99)
Glucose-Capillary: 196 mg/dL — ABNORMAL HIGH (ref 65–99)

## 2017-12-29 NOTE — Progress Notes (Signed)
Physical Therapy Treatment Patient Details Name: Ralph Yang MRN: [id number] DOB: [birth year] Today's Date: 12/29/2017    History of Present Illness Ralph Yang is a 84 y.o. male with a history of diabetes, hypertension, arthritis, history of rectal cancer.  Patient seen for debilitating, worsening back pain.  Patient has always had a history of back pain that is been evaluated by Ortho in the past.  He did not have any scans done other than x-rays.  The orthopedist noted significant arthritis.  His back pain is been worsening over the past 3 to 4 days to the point where he has extreme pain to walk.  He sits most of the time in his chair because the pain.  His wife is been unable to take care of him, as well as another family member in the home.      PT Comments    Pt supine in bed and initially concerned with red urine, RN informed.  Following examination from RN and pain medication for LBP pt willing to participate with therapy today.  Pt very slow and labored with bed mobility, transfers and gait.  Cueing to improve posture to assist with pain and improve gait mechanics.  No LOB through session, was slow and labored.  Was able to increase distance with gait today, was limited by fatigue following.  EOS pt requested to return to bed for back pain control.  Call bell within reach.     Follow Up Recommendations  SNF;Supervision/Assistance - 24 hour     Equipment Recommendations  None recommended by PT    Recommendations for Other Services       Precautions / Restrictions Precautions Precautions: Fall Restrictions Weight Bearing Restrictions: No    Mobility  Bed Mobility Overal bed mobility: Modified Independent Bed Mobility: Supine to Sit;Sit to Supine     Supine to sit: Min assist(flattened bed) Sit to supine: Modified independent (Device/Increase time)   General bed mobility comments: increased time  Transfers Overall transfer level: Needs assistance Equipment  used: Rolling walker (2 wheeled) Transfers: Sit to/from Stand Sit to Stand: Min assist         General transfer comment: cueing for hand placement to assist; flattened bed   Ambulation/Gait Ambulation/Gait assistance: Min assist Ambulation Distance (Feet): 24 Feet Assistive device: Rolling walker (2 wheeled) Gait Pattern/deviations: Decreased step length - right;Decreased step length - left;Decreased stride length Gait velocity: slow   General Gait Details: demonstrates slow labored unsteady cadence, no loss of balance, limited secondary to fatigue, fair/poor standing balance   Stairs             Wheelchair Mobility    Modified Rankin (Stroke Patients Only)       Balance                                            Cognition Arousal/Alertness: Awake/alert Behavior During Therapy: WFL for tasks assessed/performed Overall Cognitive Status: Within Functional Limits for tasks assessed                                        Exercises      General Comments        Pertinent Vitals/Pain Pain Score: 8  Pain Location: chronic low back bilateral hips Pain Descriptors / Indicators:  Constant;Aching Pain Intervention(s): Limited activity within patient's tolerance;Monitored during session;Premedicated before session;Repositioned    Home Living                      Prior Function            PT Goals (current goals can now be found in the care plan section)      Frequency    Min 3X/week      PT Plan      Co-evaluation              AM-PAC PT "6 Clicks" Daily Activity  Outcome Measure  Difficulty turning over in bed (including adjusting bedclothes, sheets and blankets)?: None Difficulty moving from lying on back to sitting on the side of the bed? : A Little Difficulty sitting down on and standing up from a chair with arms (e.g., wheelchair, bedside commode, etc,.)?: A Little   Help needed walking in  hospital room?: A Lot Help needed climbing 3-5 steps with a railing? : A Lot 6 Click Score: 14    End of Session Equipment Utilized During Treatment: Gait belt Activity Tolerance: Patient tolerated treatment well;Patient limited by fatigue Patient left: in bed;with call bell/phone within reach(pt. requested to return to bed for LBP control) Nurse Communication: Mobility status PT Visit Diagnosis: Unsteadiness on feet (R26.81);Other abnormalities of gait and mobility (R26.89);Muscle weakness (generalized) (M62.81)     Time: 1937-9024 PT Time Calculation (min) (ACUTE ONLY): 12 min  Charges:  $Therapeutic Activity: 8-22 mins                    G Codes:      Ihor Austin, LPTA; CBIS [phone]   Aldona Lento 12/29/2017, 10:39 AM

## 2017-12-29 NOTE — Progress Notes (Signed)
Pt noticing new onset of blood in urine. Pt has on a condom catheter so it is visibly present in tube and bag, with even a few clots noted. Pt has no c/o of pain, urgency, etc. Just the bloody urine. MD made aware, will continue to monitor.

## 2017-12-29 NOTE — Progress Notes (Signed)
PROGRESS NOTE    Ralph Yang  DUK:[id number] DOB: [birth year] DOA: 12/26/2017 PCP: Celene Squibb, MD    Brief Narrative:  84 year old male with history of hypertension diabetes, admitted to the hospital with worsening back pain and ability to walk.  Started on prednisone for degenerative disc disease.  CT imaging was unrevealing.  Seen by physical therapy recommended skilled nursing facility placement.   Assessment & Plan:   Principal Problem:   Failure to thrive in adult Active Problems:   Hypertension   Diabetes mellitus without complication ([place])   Acute bilateral low back pain without sciatica   1. Acute lower back pain with radicular symptoms.  CT of the lumbar spine showed moderate degeneration.  Patient refused to undergo any MRI and have any surgical evaluation.  He is been started on prednisone and pain management with some improvement of his symptoms.  Seen by physical therapy who recommended skilled nursing facility placement. 2. Diabetes.  Blood sugars are running higher due to prednisone.  Continue to monitor with sliding scale insulin. 3. BPH.  Continue on Flomax. 4. Hypertension.  Blood pressure currently stable.  Continue lisinopril 5. Hyperlipidemia.  Continue statin. 6. Hematuria.  Reported by staff that he was noted to have some blood in condom catheter as well as possible clots.  CT of the abdomen done on admission did not show any significant stones/mass in the kidney/bladder.  Will hold further Lovenox.  Continue to monitor.   DVT prophylaxis: Lovenox Code Status: DNR Family Communication: Discussed with family at the bedside Disposition Plan: discharge to SNF when bed available   Consultants:     Procedures:     Antimicrobials:       Subjective: No new complaints.  Back pain slowly improving.  Objective: Vitals:   12/28/17 1426 12/28/17 2118 12/29/17 0716 12/29/17 1502  BP: 139/69 113/70 (!) 151/86 120/60  Pulse: 73 68 71 71  Resp: 18  16 18 20   Temp:  (!) 97.5 F (36.4 C) 98 F (36.7 C) 97.7 F (36.5 C)  TempSrc:  Oral Oral Oral  SpO2: 98% 96% 100% 97%  Weight:      Height:        Intake/Output Summary (Last 24 hours) at 12/29/2017 1755 Last data filed at 12/29/2017 1300 Gross per 24 hour  Intake 960 ml  Output 1200 ml  Net -240 ml   Filed Weights   12/26/17 1257 12/26/17 1756  Weight: 78 kg (172 lb) 73.6 kg (162 lb 4.1 oz)    Examination:  General exam: Alert, awake, oriented x 3 Respiratory system: Clear to auscultation. Respiratory effort normal. Cardiovascular system:RRR. No murmurs, rubs, gallops. Gastrointestinal system: Abdomen is nondistended, soft and nontender. No organomegaly or masses felt. Normal bowel sounds heard. Central nervous system: Alert and oriented. No focal neurological deficits. Extremities: No C/C/E, +pedal pulses Skin: No rashes, lesions or ulcers Psychiatry: Judgement and insight appear normal. Mood & affect appropriate.   Data Reviewed: I have personally reviewed following labs and imaging studies  CBC: Recent Labs  Lab 12/26/17 1335  WBC 8.9  NEUTROABS 5.3  HGB 12.4*  HCT 37.5*  MCV 89.1  PLT 376   Basic Metabolic Panel: Recent Labs  Lab 12/26/17 1335  NA 138  K 3.9  CL 105  CO2 23  GLUCOSE 83  BUN 14  CREATININE 0.84  CALCIUM 9.2   GFR: Estimated Creatinine Clearance: 67.9 mL/min (by C-G formula based on SCr of 0.84 mg/dL). Liver Function Tests: Recent Labs  Lab 12/26/17 1335  AST 22  ALT 19  ALKPHOS 98  BILITOT 1.0  PROT 6.6  ALBUMIN 3.4*   No results for input(s): LIPASE, AMYLASE in the last 168 hours. No results for input(s): AMMONIA in the last 168 hours. Coagulation Profile: No results for input(s): INR, PROTIME in the last 168 hours. Cardiac Enzymes: No results for input(s): CKTOTAL, CKMB, CKMBINDEX, TROPONINI in the last 168 hours. BNP (last 3 results) No results for input(s): PROBNP in the last 8760 hours. HbA1C: No results for  input(s): HGBA1C in the last 72 hours. CBG: Recent Labs  Lab 12/28/17 1631 12/28/17 2116 12/29/17 0821 12/29/17 1111 12/29/17 1648  GLUCAP 227* 140* 95 237* 223*   Lipid Profile: No results for input(s): CHOL, HDL, LDLCALC, TRIG, CHOLHDL, LDLDIRECT in the last 72 hours. Thyroid Function Tests: No results for input(s): TSH, T4TOTAL, FREET4, T3FREE, THYROIDAB in the last 72 hours. Anemia Panel: No results for input(s): VITAMINB12, FOLATE, FERRITIN, TIBC, IRON, RETICCTPCT in the last 72 hours. Sepsis Labs: No results for input(s): PROCALCITON, LATICACIDVEN in the last 168 hours.  No results found for this or any previous visit (from the past 240 hour(s)).       Radiology Studies: No results found.      Scheduled Meds: . insulin aspart  0-15 Units Subcutaneous TID WC  . insulin aspart  0-5 Units Subcutaneous QHS  . lisinopril  5 mg Oral Daily  . pravastatin  10 mg Oral QHS  . predniSONE  40 mg Oral Q breakfast  . senna-docusate  1 tablet Oral BID  . tamsulosin  0.4 mg Oral Daily   Continuous Infusions:   LOS: 0 days    Time spent: 13mins    Kathie Dike, MD [facility] Pager [phone]  If 7PM-7AM, please contact night-coverage [email or web address] Password [facility] 12/29/2017, 5:55 PM

## 2017-12-30 ENCOUNTER — Non-Acute Institutional Stay (SKILLED_NURSING_FACILITY): Payer: Medicare HMO | Admitting: Internal Medicine

## 2017-12-30 ENCOUNTER — Encounter: Payer: Self-pay | Admitting: Internal Medicine

## 2017-12-30 ENCOUNTER — Inpatient Hospital Stay
Admission: RE | Admit: 2017-12-30 | Discharge: 2018-01-19 | Disposition: A | Discharge status: Home / Self Care | Payer: Medicare HMO | Source: Ambulatory Visit | Attending: Internal Medicine | Admitting: Internal Medicine

## 2017-12-30 DIAGNOSIS — M545 Low back pain, unspecified: Secondary | ICD-10-CM

## 2017-12-30 DIAGNOSIS — M5116 Intervertebral disc disorders with radiculopathy, lumbar region: Secondary | ICD-10-CM | POA: Diagnosis not present

## 2017-12-30 DIAGNOSIS — R262 Difficulty in walking, not elsewhere classified: Secondary | ICD-10-CM | POA: Diagnosis not present

## 2017-12-30 DIAGNOSIS — N400 Enlarged prostate without lower urinary tract symptoms: Secondary | ICD-10-CM

## 2017-12-30 DIAGNOSIS — E119 Type 2 diabetes mellitus without complications: Secondary | ICD-10-CM | POA: Diagnosis not present

## 2017-12-30 DIAGNOSIS — D72829 Elevated white blood cell count, unspecified: Secondary | ICD-10-CM | POA: Diagnosis not present

## 2017-12-30 DIAGNOSIS — R310 Gross hematuria: Secondary | ICD-10-CM | POA: Diagnosis not present

## 2017-12-30 DIAGNOSIS — R627 Adult failure to thrive: Secondary | ICD-10-CM

## 2017-12-30 DIAGNOSIS — M1990 Unspecified osteoarthritis, unspecified site: Secondary | ICD-10-CM | POA: Diagnosis not present

## 2017-12-30 DIAGNOSIS — I10 Essential (primary) hypertension: Secondary | ICD-10-CM | POA: Diagnosis not present

## 2017-12-30 DIAGNOSIS — E785 Hyperlipidemia, unspecified: Secondary | ICD-10-CM | POA: Diagnosis not present

## 2017-12-30 DIAGNOSIS — E1169 Type 2 diabetes mellitus with other specified complication: Secondary | ICD-10-CM | POA: Diagnosis not present

## 2017-12-30 DIAGNOSIS — R319 Hematuria, unspecified: Secondary | ICD-10-CM | POA: Diagnosis not present

## 2017-12-30 DIAGNOSIS — Z87440 Personal history of urinary (tract) infections: Secondary | ICD-10-CM | POA: Diagnosis not present

## 2017-12-30 DIAGNOSIS — M549 Dorsalgia, unspecified: Secondary | ICD-10-CM | POA: Insufficient documentation

## 2017-12-30 DIAGNOSIS — M6281 Muscle weakness (generalized): Secondary | ICD-10-CM | POA: Diagnosis not present

## 2017-12-30 DIAGNOSIS — R338 Other retention of urine: Secondary | ICD-10-CM | POA: Diagnosis not present

## 2017-12-30 DIAGNOSIS — N390 Urinary tract infection, site not specified: Secondary | ICD-10-CM | POA: Diagnosis not present

## 2017-12-30 DIAGNOSIS — M5442 Lumbago with sciatica, left side: Secondary | ICD-10-CM | POA: Diagnosis not present

## 2017-12-30 DIAGNOSIS — M5441 Lumbago with sciatica, right side: Secondary | ICD-10-CM | POA: Diagnosis not present

## 2017-12-30 LAB — GLUCOSE, CAPILLARY
Glucose-Capillary: 126 mg/dL — ABNORMAL HIGH (ref 65–99)
Glucose-Capillary: 330 mg/dL — ABNORMAL HIGH (ref 65–99)

## 2017-12-30 MED ORDER — PRAVASTATIN SODIUM 20 MG PO TABS: 10.0000 mg | ORAL_TABLET | Freq: Every day | ORAL | Status: DC

## 2017-12-30 MED ORDER — PREDNISONE 20 MG PO TABS: ORAL_TABLET | ORAL | Status: DC

## 2017-12-30 MED ORDER — TRAMADOL HCL 50 MG PO TABS: 50.0000 mg | ORAL_TABLET | Freq: Four times a day (QID) | ORAL | 0 refills | Status: DC | PRN

## 2017-12-30 NOTE — Progress Notes (Signed)
This is an acute visit.  Level of care is skilled.  Facility is [facility].  Chief complaint-acute visit status post hospitalization for back pain  History of present illness.  Patient is a pleasant 84 year old male with admitted here for rehab after hospitalization for severe back pain  Patient has a previous history of hypertension as well as type 2 diabetes and history of rectal cancer status post colectomy.  He apparently presented to the ER with severe back pain and failure to thrive secondary to the pain-per ER report was somewhat disabled and not been able to shower for several days  -CT of the lumbar spine showed moderate degeneration- patient deferred any MRI since he felt he would not be able to tolerate the procedure and has no interest in pursuing any surgical evaluation.  He has been started on prednisone and pain management with tramadol and has had some improvement.  Physical therapy recommended skilled nursing placement.  He also has a history of type 2 diabetes blood sugars were running higher in the hospital this was thought secondary to the prednisone--oral agents were restarted on discharge including Glucophage and Glucovance.  In regards to hypertension he is on lisinopril this appears to be stable and was not an issue in the hospital.  He also has a history of BPH and is on Flomax.--Apparently there was a short episode of hematuria in the hospital that has resolved  Of note he does have a previous history of rectal cancer and is status post colectomy.  Appears he also has a previous history of a distal radius fracture but refused surgery- apparently this has healed but malaligned per note from orthopedics in early 2018  In addition he has a condom catheter he says he had this placed in the hospital secondary to inability to use the urinal without complications spillage.  Currently he is resting in bed comfortably he says pain is not an issue except when  he moves-he says the pain is fairly significant however with significant movement.  Past Medical History:  Diagnosis Date  . Arthritis   . Cancer ([place])    rectal  . Diabetes mellitus without complication ([place])   . Hypertension         Past Surgical History:  Procedure Laterality Date  . BACK SURGERY    . CHOLECYSTECTOMY    . COLOSTOMY    . COLOSTOMY     Social History:  reports that he has never smoked. He has never used smokeless tobacco. He reports that he does not drink alcohol or use drugs. Patient lives at home      Allergies  Allergen Reactions  . Sulfa Antibiotics Nausea Only    No family history on file.  No family history of compression fracture    MEDICATIONS glyBURIDE-metformin 5-500 MG tablet Commonly known as:  GLUCOVANCE Take 0.5 tablets by mouth every morning.   lisinopril 5 MG tablet Commonly known as:  PRINIVIL,ZESTRIL Take 5 mg by mouth daily.   metFORMIN 500 MG tablet Commonly known as:  GLUCOPHAGE Take 500 mg by mouth every evening.   pravastatin 20 MG tablet Commonly known as:  PRAVACHOL Take 0.5 tablets (10 mg total) by mouth at bedtime.   predniSONE 20 MG tablet Commonly known as:  DELTASONE Take 40mg  po daily for 2 days then 30mg  daily for 2 days then 20mg  daily for 2 days then 10mg  daily for 2 days then stop   Saw Palmetto 450 MG Caps Take 450 mg by  mouth every morning.   tamsulosin 0.4 MG Caps capsule Commonly known as:  FLOMAX Take 1 capsule (0.4 mg total) by mouth daily.   traMADol 50 MG tablet Commonly known as:  ULTRAM Take 1 tablet (50 mg total) by mouth every 6 (six) hours as needed for moderate pain.   VITAMIN B 12 PO Take 500 mcg by mouth daily. GUMMIES   Vitamin D (Cholecalciferol) 1000 units Tabs Take 1,000 Units by mouth every morning.          Review of systems.  In general is not complaining of fever chills.  Skin is not complaining of rashes itching or  diaphoresis.  Head ears eyes nose mouth and throat is not complaining of sore throat or visual changes.  Respiratory denies shortness of breath or cough.  Cardiac does not complain of chest pain or increased edema.  GI does not complain of abdominal discomfort nausea vomiting diarrhea or constipation-he does have a colostomy bag with history of rectal cancer in the past  GU he is not complaining of dysuria he does have a condom catheter he does have a history of BPH.  Musculoskeletal at this point is not complaining of pain but says he does have significant discomfort with movement- this is mainly back pain.  Neurologic is not complaining of dizziness headache or numbness at this point.  Psych does not complain of being depressed or anxious.  Physical exam.   temperature 96.7 pulse 75 respiration 17 blood pressure manually 132/82  In general this is a pleasant elderly male in no distress resting comfortably in bed.  His skin is warm and dry he does have numerous solar changes most prominent on his face.  Eyes sclera and conjunctive are clear visual acuity appears grossly intact.  Oropharynx is clear mucous membranes are moist.  Chest is clear to auscultation there is no labored breathing.  Heart is regular rate and rhythm without murmur gallop or rub he does not have significant lower extremity edema pedal pulses are intact bilaterally.  Abdomen is soft nontender with positive bowel sounds he does have a colostomy bag left abdomen.  GU he does have a condom catheter at this point is draining amber-colored urine I do not see evidence of hematuria  Musculoskeletal Limited exam since he is in bed but is able to move all extremities x4 he does have contractures of his second fingers digit finger bilaterally.  Neurologic is grossly intact his speech is clear to touch sensation appears to be intact.  Psych he is alert and oriented pleasant and appropriate.  Labs.  December 26, 2017.  Sodium 138 potassium 3.9 BUN 14 creatinine 0.84.  Albumin 3.4 otherwise liver function test within normal limits.  WBC 8.9 hemoglobin 12.4 platelets 194.  Assessment and plan.  1.  History of severe back pain-CT showed degenerative changes again patient deferred MRI- he is on a tapering course of prednisone-sis also has tramadol for pain.  At this point will monitor suspect physical therapy will be somewhat of a challenge.  2.  History of type 2 diabetes his oral medications including Glucovance as well as Glucophage have been restarted blood sugars were running somewhat high in the hospital this was thought secondary to prednisone will monitor CBGs twice daily. It appears CBGs were variable in the hospital at noon was 330 this morning was 126.  .   #3 history of hypertension will monitor he is on lisinopril appears stable on manual reading today at 132/82.  4.  History of BPH continues on Flomax-of note he does have a condom catheter-says he had issues using a urinal in the hospital  with spillage  #5- history of hyperlipidemia he is on a statin.  6.  History of rectal cancer he states this was around 2005-cancer was surgically removed- he does have a colostomy he says  Has   tolerated this well.  7.  Hematuria apparently this was transitory in the hospital and has resolved-at this point will monitor I do not note any blood in the urine today.  8.-Failure to thrive-apparently before hospital admission patient was significantly weak and unable to shower and apparently had not eaten well-again hopefully with skilled nursing care there will be a better po intake and pain will be controlled   will update a CBC and BMP next week to keep an eye on his hemoglobin and renal function- clinically appears stable but will need therapy for strengthening again this may be an issue with his pain-    [id number] Of note greater than 35 minutes spent assessing patient-reviewing his chart and  labs- coordinating and formulating a plan of care for numerous diagnoses- of note greater than 50% of time spent coordinating a plan of care

## 2017-12-30 NOTE — Plan of Care (Signed)
Continue with ongoing plan of care

## 2017-12-30 NOTE — Clinical Social Work Placement (Signed)
   CLINICAL SOCIAL WORK PLACEMENT  NOTE  Date:  12/30/2017  Patient Details  Name: Ralph Yang MRN: [id number] Date of Birth: [birth year]  Clinical Social Work is seeking post-discharge placement for this patient at the [place] level of care (*CSW will initial, date and re-position this form in  chart as items are completed):  Yes   Patient/family provided with [place] Work Department's list of facilities offering this level of care within the geographic area requested by the patient (or if unable, by the patient's family).  Yes   Patient/family informed of their freedom to choose among providers that offer the needed level of care, that participate in Medicare, Medicaid or managed care program needed by the patient, have an available bed and are willing to accept the patient.  Yes   Patient/family informed of [facility]'s ownership interest in [facility] and [facility], as well as of the fact that they are under no obligation to receive care at these facilities.  PASRR submitted to EDS on 12/28/17     PASRR number received on 12/28/17     Existing PASRR number confirmed on       FL2 transmitted to all facilities in geographic area requested by pt/family on 12/28/17     FL2 transmitted to all facilities within larger geographic area on       Patient informed that his/her managed care company has contracts with or will negotiate with certain facilities, including the following:        Yes   Patient/family informed of bed offers received.  Patient chooses bed at [facility]     Physician recommends and patient chooses bed at      Patient to be transferred to [facility] on 12/30/17.  Patient to be transferred to facility by wheelchair     Patient family notified on 12/30/17 of transfer.  Name of family member notified:  pt says he will notify family     PHYSICIAN       Additional Comment: Pt's insurance  has authorized rehab at [facility]. Spoke with pt today to update. He remains in agreement with the plan. Updated MD and RN. RN to call report and transport pt by wheelchair once orders are complete. DC clinical will be sent through the hub when available. Pt stated he would update his family about plan. There are no other SW needs for dc.   _______________________________________________ Shade Flood, LCSW 12/30/2017, 12:19 PM

## 2017-12-30 NOTE — Progress Notes (Signed)
IV discontinued,catheter intact. Patient discharged to [facility],report called and given report to Olu Shittu LPN. Vital signs stable. Accompanied by staff to awaiting facility.

## 2017-12-30 NOTE — Discharge Summary (Signed)
Physician Discharge Summary  Ralph Yang MKL:[id number] DOB: 1934/02/14 DOA: 12/26/2017  PCP: Celene Squibb, MD  Admit date: 12/26/2017 Discharge date: 12/30/2017  Admitted From: home Disposition:  SNF  Recommendations for Outpatient Follow-up:  1. Follow up with PCP in 1-2 weeks 2. Please obtain BMP/CBC in one week  Discharge Condition: improved CODE STATUS:DNR Diet recommendation: heart heatlhy, diabetic diet  Brief/Interim Summary: 84 year old male with history of hypertension diabetes, admitted to the hospital with worsening back pain and ability to walk.  Started on prednisone for degenerative disc disease.  CT imaging was unrevealing.  Seen by physical therapy recommended skilled nursing facility placement.  Discharge Diagnoses:  Principal Problem:   Failure to thrive in adult Active Problems:   Hypertension   Diabetes mellitus without complication ([place])   Acute bilateral low back pain without sciatica  1. Acute lower back pain with radicular symptoms.  CT of the lumbar spine showed moderate degeneration.  Patient refused to undergo any MRI since he felt he would not be able to tolerate this procedure and had no interest in pursuing any surgical evaluation.  He was started on prednisone and pain management with some improvement of his symptoms.  Seen by physical therapy who recommended skilled nursing facility placement. 2. Diabetes.  Blood sugars are running higher due to prednisone.    With sliding scale insulin.  Resume oral agents on discharge.  Anticipate blood sugar should improve as steroids are tapered. 3. BPH.  Continue on Flomax. 4. Hypertension.  Blood pressure currently stable.  Continue lisinopril 5. Hyperlipidemia.  Continue statin. Hematuria.  Reported by staff that he was noted to have some blood in condom catheter as well as possible clots.  CT of the abdomen done on admission did not show any significant stones/mass in the kidney/bladder.    This had resolved  at the time of discharge.  Continue to monitor.    Discharge Instructions  Discharge Instructions    Diet - low sodium heart healthy   Complete by:  As directed    Increase activity slowly   Complete by:  As directed      Allergies as of 12/30/2017      Reactions   Sulfa Antibiotics Nausea Only      Medication List    STOP taking these medications   ALEVE 220 MG tablet Generic drug:  naproxen sodium   ibuprofen 200 MG tablet Commonly known as:  ADVIL,MOTRIN     TAKE these medications   glyBURIDE-metformin 5-500 MG tablet Commonly known as:  GLUCOVANCE Take 0.5 tablets by mouth every morning.   lisinopril 5 MG tablet Commonly known as:  PRINIVIL,ZESTRIL Take 5 mg by mouth daily.   metFORMIN 500 MG tablet Commonly known as:  GLUCOPHAGE Take 500 mg by mouth every evening.   pravastatin 20 MG tablet Commonly known as:  PRAVACHOL Take 0.5 tablets (10 mg total) by mouth at bedtime.   predniSONE 20 MG tablet Commonly known as:  DELTASONE Take 40mg  po daily for 2 days then 30mg  daily for 2 days then 20mg  daily for 2 days then 10mg  daily for 2 days then stop   Saw Palmetto 450 MG Caps Take 450 mg by mouth every morning.   tamsulosin 0.4 MG Caps capsule Commonly known as:  FLOMAX Take 1 capsule (0.4 mg total) by mouth daily.   traMADol 50 MG tablet Commonly known as:  ULTRAM Take 1 tablet (50 mg total) by mouth every 6 (six) hours as needed for moderate  pain.   VITAMIN B 12 PO Take 500 mcg by mouth daily. GUMMIES   Vitamin D (Cholecalciferol) 1000 units Tabs Take 1,000 Units by mouth every morning.      Contact information for after-discharge care    [facility] .   Service:  Skilled Nursing Contact information: [address] [address] [phone]             Allergies  Allergen Reactions  . Sulfa Antibiotics Nausea Only    Consultations:     Procedures/Studies: Ct Abdomen  Pelvis W Contrast  Result Date: 12/26/2017 CLINICAL DATA:  Worsening abdominal and back pain. Personal history of rectal carcinoma. EXAM: CT ABDOMEN AND PELVIS WITH CONTRAST TECHNIQUE: Multidetector CT imaging of the abdomen and pelvis was performed using the standard protocol following bolus administration of intravenous contrast. CONTRAST:  152mL ISOVUE-300 IOPAMIDOL (ISOVUE-300) INJECTION 61% COMPARISON:  PET-CT on 01/15/2007 FINDINGS: Lower Chest: No acute findings. Hepatobiliary: No hepatic masses identified. Prior cholecystectomy. No evidence of biliary obstruction. Pancreas:  No mass or inflammatory changes. Spleen: Within normal limits in size and appearance. Adrenals/Urinary Tract: No masses identified. Bilateral lower pole renal cyst noted. No evidence of hydronephrosis. Stomach/Bowel: Postop changes from previous sigmoid colectomy with left lower quadrant colostomy. A large peristomal hernia is again seen containing sigmoid colon. No evidence of diverticulitis or bowel obstruction. Presacral surgical clips in soft tissue density remains stable, consistent with post treatment changes. Vascular/Lymphatic: No pathologically enlarged lymph nodes. No abdominal aortic aneurysm. Aortic atherosclerosis. Reproductive:  No mass or other significant abnormality. Other:  None. Musculoskeletal: No suspicious bone lesions identified. Lumbar spine degenerative changes and severe left hip osteoarthritis noted. IMPRESSION: No acute findings. Stable post treatment changes in presacral region. No evidence of recurrent or metastatic carcinoma. Stable left lower quadrant colostomy with large peristomal hernia containing colon. Electronically Signed   By: Earle Gell M.D.   On: 12/26/2017 16:40   Ct L-spine No Charge  Result Date: 12/26/2017 CLINICAL DATA:  Chronic low back pain.  Rectal cancer. EXAM: CT LUMBAR SPINE WITHOUT CONTRAST TECHNIQUE: Multidetector CT imaging of the lumbar spine was performed without intravenous  contrast administration. Multiplanar CT image reconstructions were also generated. Images were generated from the same data set as the CT abdomen and pelvis of the same day. COMPARISON:  Lumbar spine radiographs 03/22/2017 FINDINGS: Segmentation: 5 non rib-bearing lumbar type vertebral bodies are present. The lowest fully formed vertebral body is L5. Alignment: AP alignment is anatomic. Levoconvex curvature is centered at L2-3. Vertebrae: Vertebral body heights are maintained. Endplate sclerotic changes are noted on the right at L3-4. No focal lytic or blastic lesions are present. Paraspinal and other soft tissues: Atherosclerotic calcifications are present in the aorta. Renal cystic changes are present bilaterally. Please see dedicated CT abdomen report from the same day. Disc levels: There is acquired fusion across the disc space at L1-2 and L2-3. There is fusion across the disc space at L5-S1. Vacuum disc is present at L3-4 and L4-5. Right foraminal narrowing is present at L3-4 due to a rightward disc protrusion and asymmetric facet disease. Moderate central canal stenosis is present at L4-5 with left greater than right subarticular narrowing. Moderate foraminal stenosis is present bilaterally at L4-5 as well. Bone spurs contribute to moderate left subarticular and foraminal stenosis at L5-S1. IMPRESSION: 1. No acute abnormality. 2. Moderate right foraminal stenosis at L3-4 due to a rightward disc protrusion and facet hypertrophy. 3. Moderate central canal  narrowing with left greater than right subarticular narrowing and right greater than left moderate foraminal stenosis at L4-5. 4. Moderate left subarticular and foraminal stenosis at L5-S1 secondary to bone spurs and facet hypertrophy. Electronically Signed   By: San Morelle M.D.   On: 12/26/2017 16:16       Subjective: Back pain and leg pain is doing better  Discharge Exam: Vitals:   12/29/17 2115 12/30/17 0710  BP: 117/77 (!) 146/77   Pulse: 69 66  Resp: 18 18  Temp: 97.8 F (36.6 C) (!) 97.4 F (36.3 C)  SpO2: 98% 97%   Vitals:   12/29/17 1502 12/29/17 2110 12/29/17 2115 12/30/17 0710  BP: 120/60  117/77 (!) 146/77  Pulse: 71  69 66  Resp: 20  18 18   Temp: 97.7 F (36.5 C)  97.8 F (36.6 C) (!) 97.4 F (36.3 C)  TempSrc: Oral  Oral Oral  SpO2: 97% 97% 98% 97%  Weight:      Height:        General: Pt is alert, awake, not in acute distress Cardiovascular: RRR, S1/S2 +, no rubs, no gallops Respiratory: CTA bilaterally, no wheezing, no rhonchi Abdominal: Soft, NT, ND, bowel sounds + Extremities: no edema, no cyanosis    The results of significant diagnostics from this hospitalization (including imaging, microbiology, ancillary and laboratory) are listed below for reference.     Microbiology: No results found for this or any previous visit (from the past 240 hour(s)).   Labs: BNP (last 3 results) No results for input(s): BNP in the last 8760 hours. Basic Metabolic Panel: Recent Labs  Lab 12/26/17 1335  NA 138  K 3.9  CL 105  CO2 23  GLUCOSE 83  BUN 14  CREATININE 0.84  CALCIUM 9.2   Liver Function Tests: Recent Labs  Lab 12/26/17 1335  AST 22  ALT 19  ALKPHOS 98  BILITOT 1.0  PROT 6.6  ALBUMIN 3.4*   No results for input(s): LIPASE, AMYLASE in the last 168 hours. No results for input(s): AMMONIA in the last 168 hours. CBC: Recent Labs  Lab 12/26/17 1335  WBC 8.9  NEUTROABS 5.3  HGB 12.4*  HCT 37.5*  MCV 89.1  PLT 194   Cardiac Enzymes: No results for input(s): CKTOTAL, CKMB, CKMBINDEX, TROPONINI in the last 168 hours. BNP: Invalid input(s): POCBNP CBG: Recent Labs  Lab 12/29/17 1111 12/29/17 1648 12/29/17 2121 12/30/17 0752 12/30/17 1129  GLUCAP 237* 223* 196* 126* 330*   D-Dimer No results for input(s): DDIMER in the last 72 hours. Hgb A1c No results for input(s): HGBA1C in the last 72 hours. Lipid Profile No results for input(s): CHOL, HDL, LDLCALC,  TRIG, CHOLHDL, LDLDIRECT in the last 72 hours. Thyroid function studies No results for input(s): TSH, T4TOTAL, T3FREE, THYROIDAB in the last 72 hours.  Invalid input(s): FREET3 Anemia work up No results for input(s): VITAMINB12, FOLATE, FERRITIN, TIBC, IRON, RETICCTPCT in the last 72 hours. Urinalysis    Component Value Date/Time   COLORURINE YELLOW 12/26/2017 1320   APPEARANCEUR CLEAR 12/26/2017 1320   LABSPEC 1.019 12/26/2017 1320   PHURINE 6.0 12/26/2017 1320   GLUCOSEU NEGATIVE 12/26/2017 1320   HGBUR NEGATIVE 12/26/2017 1320   BILIRUBINUR NEGATIVE 12/26/2017 1320   KETONESUR NEGATIVE 12/26/2017 1320   PROTEINUR NEGATIVE 12/26/2017 1320   NITRITE NEGATIVE 12/26/2017 1320   LEUKOCYTESUR NEGATIVE 12/26/2017 1320   Sepsis Labs Invalid input(s): PROCALCITONIN,  WBC,  LACTICIDVEN Microbiology No results found for this or any previous visit (from  the past 240 hour(s)).   Time coordinating discharge: 52mins  SIGNED:   Kathie Dike, MD  [facility] 12/30/2017, 12:41 PM Pager   If 7PM-7AM, please contact night-coverage [email or web address] Password [email or web address]

## 2017-12-31 ENCOUNTER — Encounter: Payer: Self-pay | Admitting: Internal Medicine

## 2017-12-31 ENCOUNTER — Non-Acute Institutional Stay (SKILLED_NURSING_FACILITY): Payer: Medicare HMO | Admitting: Internal Medicine

## 2017-12-31 ENCOUNTER — Other Ambulatory Visit: Payer: Self-pay

## 2017-12-31 DIAGNOSIS — M545 Low back pain, unspecified: Secondary | ICD-10-CM

## 2017-12-31 DIAGNOSIS — E1169 Type 2 diabetes mellitus with other specified complication: Secondary | ICD-10-CM | POA: Diagnosis not present

## 2017-12-31 DIAGNOSIS — E785 Hyperlipidemia, unspecified: Secondary | ICD-10-CM

## 2017-12-31 DIAGNOSIS — E119 Type 2 diabetes mellitus without complications: Secondary | ICD-10-CM | POA: Diagnosis not present

## 2017-12-31 DIAGNOSIS — I10 Essential (primary) hypertension: Secondary | ICD-10-CM | POA: Diagnosis not present

## 2017-12-31 MED ORDER — TRAMADOL HCL 50 MG PO TABS: 50.0000 mg | ORAL_TABLET | Freq: Four times a day (QID) | ORAL | 0 refills | Status: DC | PRN

## 2017-12-31 NOTE — Progress Notes (Signed)
Provider:  Veleta Miners Location:   [facility] Room Number: 125/P Place of Service:  SNF (31)  PCP: Celene Squibb, MD Patient Care Team: Celene Squibb, MD as PCP - General (Internal Medicine)  Extended Emergency Contact Information Primary Emergency Contact: [place]ch[place],[name] Address: [address]          [place], [place] [address] [place] Phone: [phone] Relation: Spouse  Code Status: Full Code Goals of Care: Advanced Directive information Advanced Directives 12/31/2017  Does Patient Have a Medical Advance Directive? Yes  Type of Advance Directive (No Data)  Does patient want to make changes to medical advance directive? No - Patient declined  Would patient like information on creating a medical advance directive? No - Patient declined      Chief Complaint  Patient presents with  . New Admit To SNF    Patient being seen for New Admission Visit    HPI: Patient is a 84 y.o. male seen today for admission to SNF for therapy after staying in the hospital from 06/08-06/12 for Acute Back pain.  Patient has h/o Hypertension, Diabetes Type 2, S/P Colostomy for Rectal Cancer 2005 and Hyperlipidemia  He lives with his wife and for Past 3 moths he is progressively getting weaker and unable to walk due to Worsening Back pain. His pain got significant worse over past few weeks and he was unable to even take shower. His wife brought him to ED as she was unable to take care of him at home.His Pain radiates down his leg. Denies any weakness in leg. Says it is mostly pain which is making him unable to walk.  He has been using Walker for Last few months and does not go out of the house He refused MRI in the hospital for his back as he is not interested in Surgical intervention. He was started on Prednisone but says it has not helped him that much.  Past Medical History:  Diagnosis Date  . Arthritis   . Cancer ([place])    rectal  . Diabetes mellitus  without complication ([place])   . Hypertension    Past Surgical History:  Procedure Laterality Date  . BACK SURGERY    . CHOLECYSTECTOMY    . COLOSTOMY    . COLOSTOMY      reports that he has never smoked. He has never used smokeless tobacco. He reports that he does not drink alcohol or use drugs. Social History   Socioeconomic History  . Marital status: Married    Spouse name: Not on file  . Number of children: Not on file  . Years of education: Not on file  . Highest education level: Not on file  Occupational History  . Not on file  Social Needs  . Financial resource strain: Not on file  . Food insecurity:    Worry: Not on file    Inability: Not on file  . Transportation needs:    Medical: Not on file    Non-medical: Not on file  Tobacco Use  . Smoking status: Never Smoker  . Smokeless tobacco: Never Used  Substance and Sexual Activity  . Alcohol use: No  . Drug use: No  . Sexual activity: Not on file  Lifestyle  . Physical activity:    Days per week: Not on file    Minutes per session: Not on file  . Stress: Not on file  Relationships  . Social connections:    Talks on  phone: Not on file    Gets together: Not on file    Attends religious service: Not on file    Active member of club or organization: Not on file    Attends meetings of clubs or organizations: Not on file    Relationship status: Not on file  . Intimate partner violence:    Fear of current or ex partner: Not on file    Emotionally abused: Not on file    Physically abused: Not on file    Forced sexual activity: Not on file  Other Topics Concern  . Not on file  Social History Narrative  . Not on file    Functional Status Survey:    History reviewed. No pertinent family history.  Health Maintenance  Topic Date Due  . FOOT EXAM  01/30/2018 (Originally 01/03/1944)  . HEMOGLOBIN A1C  01/30/2018 (Originally [birth year])  . OPHTHALMOLOGY EXAM  01/30/2018 (Originally 01/03/1944)  . TETANUS/TDAP   01/30/2018 (Originally 01/04/1955)  . PNA vac Low Risk Adult (1 of 2 - PCV13) 01/30/2018 (Originally 01/03/2001)  . INFLUENZA VACCINE  02/18/2018    Allergies  Allergen Reactions  . Sulfa Antibiotics Nausea Only    Outpatient Encounter Medications as of 12/31/2017  Medication Sig  . Cyanocobalamin (VITAMIN B 12 PO) Take 500 mcg by mouth daily. GUMMIES  . glyBURIDE-metformin (GLUCOVANCE) 5-500 MG tablet Give 1/2 tab (2.5-250 mg) by mouth once a morning  . lisinopril (PRINIVIL,ZESTRIL) 5 MG tablet Take 5 mg by mouth daily.  . metFORMIN (GLUCOPHAGE) 500 MG tablet Take 500 mg by mouth every evening.  . pravastatin (PRAVACHOL) 20 MG tablet Take 0.5 tablets (10 mg total) by mouth at bedtime.  . predniSONE (DELTASONE) 20 MG tablet Take 40mg  po daily for 2 days then 30mg  daily for 2 days then 20mg  daily for 2 days then 10mg  daily for 2 days then stop  . Saw Palmetto 450 MG CAPS Take 450 mg by mouth every morning.   . tamsulosin (FLOMAX) 0.4 MG CAPS capsule Take 1 capsule (0.4 mg total) by mouth daily.  . traMADol (ULTRAM) 50 MG tablet Take 1 tablet (50 mg total) by mouth every 6 (six) hours as needed for moderate pain.  . Vitamin D, Cholecalciferol, 1000 UNITS TABS Take 1,000 Units by mouth every morning.   . [DISCONTINUED] glyBURIDE-metformin (GLUCOVANCE) 5-500 MG tablet Take 0.5 tablets by mouth every morning.  . [DISCONTINUED] metFORMIN (GLUCOPHAGE) 500 MG tablet Take 500 mg by mouth every evening.    No facility-administered encounter medications on file as of 12/31/2017.      Review of Systems  Review of Systems  Constitutional: Negative for activity change, appetite change, chills, diaphoresis, fatigue and fever.  HENT: Negative for mouth sores, postnasal drip, rhinorrhea, sinus pain and sore throat.   Respiratory: Negative for apnea, cough, chest tightness, shortness of breath and wheezing.   Cardiovascular: Negative for chest pain, palpitations and leg swelling.  Gastrointestinal:  Negative for abdominal distention, abdominal pain, constipation, diarrhea, nausea and vomiting.  Genitourinary: Negative for dysuria and frequency.  Musculoskeletal: Negative for arthralgias, joint swelling and myalgias.  Skin: Negative for rash.  Neurological: Negative for dizziness, syncope, weakness, light-headedness and numbness.  Psychiatric/Behavioral: Negative for behavioral problems, confusion and sleep disturbance.     Vitals:   12/31/17 0910  BP: 135/79  Pulse: 73  Resp: 20  Temp: (!) 97.2 F (36.2 C)  TempSrc: Oral   There is no height or weight on file to calculate BMI. Physical Exam  Constitutional:  He is oriented to person, place, and time. He appears well-developed and well-nourished.  HENT:  Head: Normocephalic.  Mouth/Throat: Oropharynx is clear and moist.  Eyes: Pupils are equal, round, and reactive to light.  Neck: Neck supple.  Cardiovascular: Normal rate and regular rhythm.  No murmur heard. Pulmonary/Chest: Effort normal and breath sounds normal. No stridor. No respiratory distress. He has no wheezes.  Abdominal: Soft. Bowel sounds are normal. He exhibits no distension. There is no tenderness. There is no guarding.  Musculoskeletal: He exhibits no edema.  Neurological: He is alert and oriented to person, place, and time.  Has good strength in LE. No Focal deficits.  Skin: Skin is warm and dry.  Psychiatric: He has a normal mood and affect. His behavior is normal. Thought content normal.    Labs reviewed: Basic Metabolic Panel: Recent Labs    03/22/17 1355 12/26/17 1335  NA 137 138  K 4.0 3.9  CL 103 105  CO2 26 23  GLUCOSE 149* 83  BUN 16 14  CREATININE 0.86 0.84  CALCIUM 8.8* 9.2   Liver Function Tests: Recent Labs    12/26/17 1335  AST 22  ALT 19  ALKPHOS 98  BILITOT 1.0  PROT 6.6  ALBUMIN 3.4*   No results for input(s): LIPASE, AMYLASE in the last 8760 hours. No results for input(s): AMMONIA in the last 8760 hours. CBC: Recent  Labs    03/22/17 1355 12/26/17 1335  WBC 8.9 8.9  NEUTROABS 5.3 5.3  HGB 12.6* 12.4*  HCT 37.0* 37.5*  MCV 90.0 89.1  PLT 163 194   Cardiac Enzymes: No results for input(s): CKTOTAL, CKMB, CKMBINDEX, TROPONINI in the last 8760 hours. BNP: Invalid input(s): POCBNP No results found for: HGBA1C No results found for: TSH No results found for: VITAMINB12 No results found for: FOLATE No results found for: IRON, TIBC, FERRITIN  Imaging and Procedures obtained prior to SNF admission: No results found.  Assessment/Plan Acute  low back pain without sciatica His CT scan was negative for any Acute Changes. It showed Central Canal Narrowing with Foraminal Stenosis He was started on prednisone taper. D/W Patient will continue Ultram alternate with Tylenol. Therapy.started  Diabetes mellitus  BS control on Metformin and Glyburide  Essential hypertension Controlled on Lisinopril  Hyperlipidemia  Patient says he does not want to continue taking Statin here as he was not taking it at home. Will discontinue Follow up with his PCP. ? Hematuria Patient had Gross hematuria in the hospital  Will repeat UA He follows with Urology and is on Flomax Disposition Wants to go home with his wife once Pain better Controlled. Not interested in surgery.   Family/ staff Communication:   Labs/tests ordered: Total time spent in this patient care encounter was 45_ minutes; greater than 50% of the visit spent counseling patient, reviewing records , Labs and coordinating care for problems addressed at this encounter.

## 2017-12-31 NOTE — Telephone Encounter (Signed)
RX Fax for [facility]@ [phone]  

## 2018-01-01 ENCOUNTER — Non-Acute Institutional Stay (SKILLED_NURSING_FACILITY): Payer: Medicare HMO | Admitting: Internal Medicine

## 2018-01-01 ENCOUNTER — Encounter (HOSPITAL_COMMUNITY)
Admission: RE | Admit: 2018-01-01 | Discharge: 2018-01-01 | Disposition: A | Discharge status: Home / Self Care | Payer: Medicare HMO | Source: Skilled Nursing Facility | Attending: Internal Medicine | Admitting: Internal Medicine

## 2018-01-01 ENCOUNTER — Encounter: Payer: Self-pay | Admitting: Internal Medicine

## 2018-01-01 ENCOUNTER — Other Ambulatory Visit: Payer: Self-pay

## 2018-01-01 DIAGNOSIS — E119 Type 2 diabetes mellitus without complications: Secondary | ICD-10-CM

## 2018-01-01 DIAGNOSIS — R319 Hematuria, unspecified: Secondary | ICD-10-CM | POA: Insufficient documentation

## 2018-01-01 DIAGNOSIS — M545 Low back pain, unspecified: Secondary | ICD-10-CM

## 2018-01-01 LAB — URINALYSIS, ROUTINE W REFLEX MICROSCOPIC
BILIRUBIN URINE: NEGATIVE
Glucose, UA: 50 mg/dL — AB
KETONES UR: NEGATIVE mg/dL
NITRITE: POSITIVE — AB
Protein, ur: 30 mg/dL — AB
SPECIFIC GRAVITY, URINE: 1.016 (ref 1.005–1.030)
pH: 9 — ABNORMAL HIGH (ref 5.0–8.0)

## 2018-01-01 MED ORDER — TRAMADOL HCL 50 MG PO TABS: 50.0000 mg | ORAL_TABLET | Freq: Four times a day (QID) | ORAL | 0 refills | Status: DC | PRN

## 2018-01-01 NOTE — Progress Notes (Signed)
Location:   [facility] Room Number: 125/P Place of Service:  SNF [phone]) Provider:  Cory Roughen, MD  Patient Care Team: Celene Squibb, MD as PCP - General (Internal Medicine)  Extended Emergency Contact Information Primary Emergency Contact: Kaster,[name] Address: [address]          [place], [place] [address] [place] Phone: [phone] Relation: Spouse   Code Status:  Full Code Goals of care: Advanced Directive information Advanced Directives 12/31/2017  Does Patient Have a Medical Advance Directive? Yes  Type of Advance Directive (No Data)  Does patient want to make changes to medical advance directive? No - Patient declined  Would patient like information on creating a medical advance directive? No - Patient declined     Acute visit-follow-up hematuria also pain management-  HPI:  Pt is a 84 y.o. male seen today for an acute visit for  Follow-up of hematuria as well as pain management issues.  Patient has a history of hypertension diabetes type 2 as well as a history of colostomy for rectal cancer back in 2005 and hyperlipidemia.  He is here for therapy after staying in the hospital for acute back pain.  Refused an MRI in the hospital for his back not interested in surgical intervention.  He was started on prednisone--and has been started on tramadol as needed nursing feels he would benefit from routine dosing.  He also had an episode of hematuria in the hospital which apparently resolved however when he was seen by Dr. Lyndel Safe yesterday he was found to have recurrent hematuria.  Urinalysis and culture is pending is not really complaining of acute pain in this regard  He also has a history of type 2 diabetes he is on Glucophage and glyburide since he is on prednisone will increase CBGs to before meals and at bedtime to keep an eye on this.  Currently he is resting in bed comfortably earlier had been up moving about  though and appeared to have some discomfort.  Vital signs appear to be stable continues to have some hematuria.    He is followed by urology and is on Flomax.  He also has a history of diabetes he is on  Past Medical History:  Diagnosis Date  . Arthritis   . Cancer ([place])    rectal  . Diabetes mellitus without complication ([place])   . Hypertension    Past Surgical History:  Procedure Laterality Date  . BACK SURGERY    . CHOLECYSTECTOMY    . COLOSTOMY    . COLOSTOMY      Allergies  Allergen Reactions  . Sulfa Antibiotics Nausea Only    Outpatient Encounter Medications as of 01/01/2018  Medication Sig  . acetaminophen (TYLENOL) 325 MG tablet Take 650 mg by mouth 2 (two) times daily.  Roseanne Kaufman [place]-Castor Oil (VENELEX) OINT Apply to sacrum and bilateral buttocks every shift  . Cyanocobalamin (VITAMIN B 12 PO) Take 500 mcg by mouth daily. GUMMIES  . glyBURIDE-metformin (GLUCOVANCE) 5-500 MG tablet Give 1/2 tab (2.5-250 mg) by mouth once a morning  . lisinopril (PRINIVIL,ZESTRIL) 5 MG tablet Take 5 mg by mouth daily.  . metFORMIN (GLUCOPHAGE) 500 MG tablet Take 500 mg by mouth every evening.  . predniSONE (DELTASONE) 20 MG tablet Take 40mg  po daily for 2 days then 30mg  daily for 2 days then 20mg  daily for 2 days then 10mg  daily for 2 days then stop  . Saw [employer]  450 MG CAPS Take 450 mg by mouth every morning.   . tamsulosin (FLOMAX) 0.4 MG CAPS capsule Take 1 capsule (0.4 mg total) by mouth daily.  . traMADol (ULTRAM) 50 MG tablet Take 1 tablet (50 mg total) by mouth every 6 (six) hours as needed for moderate pain.  . Vitamin D, Cholecalciferol, 1000 UNITS TABS Take 1,000 Units by mouth every morning.   . [DISCONTINUED] pravastatin (PRAVACHOL) 20 MG tablet Take 0.5 tablets (10 mg total) by mouth at bedtime.   No facility-administered encounter medications on file as of 01/01/2018.     Review of Systems   In general is not complaining of fever chills says he still feels  somewhat weak.  Skin does not complain of rashes itching or diaphoresis.  Head ears eyes nose mouth and throat is not complaining of sore throat or visual changes.  Respiratory denies shortness of breath or cough.  Cardiac is not complaining of chest pain or significant edema.  GI does not complain of abdominal discomfort nausea vomiting diarrhea constipation.  GU does complain of some mild burning with urination at times- he has some continued hematuria.  Musculoskeletal continues to complain of back pain with movement- again we will make tramadol routine to see if this helps he is also on prednisone.  Neurologic is not really complaining of dizziness headache syncope or numbness.  Psych does not appear to be overtly depressed or anxious-   There is no immunization history on file for this patient. Pertinent  Health Maintenance Due  Topic Date Due  . FOOT EXAM  01/30/2018 (Originally 01/03/1944)  . HEMOGLOBIN A1C  01/30/2018 (Originally Dec 24, 1933)  . OPHTHALMOLOGY EXAM  01/30/2018 (Originally 01/03/1944)  . PNA vac Low Risk Adult (1 of 2 - PCV13) 01/30/2018 (Originally [birth year]03/1999)  . URINE MICROALBUMIN  01/31/2018 (Originally 01/03/1944)  . INFLUENZA VACCINE  02/18/2018   No flowsheet data found. Functional Status Survey:    Vitals:   01/01/18 1538  BP: 137/75  Pulse: 80  Resp: 15  Temp: (!) 96.8 F (36 C)  TempSrc: Oral    Physical Exam   In general this is a pleasant elderly male in no distress resting comfortably in bed.  His skin is warm and dry he does have numerous solar changes most prominent on his face.  Eyes sclera and conjunctive are clear visual acuity appears grossly intact.  Oropharynx is clear mucous membranes are moist.  Chest is clear to auscultation there is no labored breathing.  Heart is regular rate and rhythm without murmur gallop or rub he does not have significant lower extremity edema pedal pulses are intact bilaterally. e does  have a condom catheter at this point is draining amber-colored urine I do not see evidence of hematuria Abdomen is soft nontender with positive bowel sounds he does have a colostomy bag left abdomen.--With a moderate amount of stool    GU --currently he is using a urinal-I did see urine in the toilet that was blood-tinged  Has minimal suprapubic tenderness could not really appreciate distention  Musculoskeletal Limited exam since he is in bed but is able to move all extremities x4 he does have contractures of his second fingers digit finger bilaterally. Strength appears baseline earlier I did see him transferring  to side of bed-continue to have weakness  Neurologic is grossly intact his speech is clear to touch sensation appears to be intact.--Could not really appreciate lateralizing findings  Psych he is alert and oriented pleasant and appropriate.  Labs reviewed: Recent Labs    03/22/17 1355 12/26/17 1335  NA 137 138  K 4.0 3.9  CL 103 105  CO2 26 23  GLUCOSE 149* 83  BUN 16 14  CREATININE 0.86 0.84  CALCIUM 8.8* 9.2   Recent Labs    12/26/17 1335  AST 22  ALT 19  ALKPHOS 98  BILITOT 1.0  PROT 6.6  ALBUMIN 3.4*   Recent Labs    03/22/17 1355 12/26/17 1335  WBC 8.9 8.9  NEUTROABS 5.3 5.3  HGB 12.6* 12.4*  HCT 37.0* 37.5*  MCV 90.0 89.1  PLT 163 194   No results found for: TSH No results found for: HGBA1C No results found for: CHOL, HDL, LDLCALC, LDLDIRECT, TRIG, CHOLHDL  Significant Diagnostic Results in last 30 days:  Ct Abdomen Pelvis W Contrast  Result Date: 12/26/2017 CLINICAL DATA:  Worsening abdominal and back pain. Personal history of rectal carcinoma. EXAM: CT ABDOMEN AND PELVIS WITH CONTRAST TECHNIQUE: Multidetector CT imaging of the abdomen and pelvis was performed using the standard protocol following bolus administration of intravenous contrast. CONTRAST:  159mL ISOVUE-300 IOPAMIDOL (ISOVUE-300) INJECTION 61% COMPARISON:  PET-CT on 01/15/2007  FINDINGS: Lower Chest: No acute findings. Hepatobiliary: No hepatic masses identified. Prior cholecystectomy. No evidence of biliary obstruction. Pancreas:  No mass or inflammatory changes. Spleen: Within normal limits in size and appearance. Adrenals/Urinary Tract: No masses identified. Bilateral lower pole renal cyst noted. No evidence of hydronephrosis. Stomach/Bowel: Postop changes from previous sigmoid colectomy with left lower quadrant colostomy. A large peristomal hernia is again seen containing sigmoid colon. No evidence of diverticulitis or bowel obstruction. Presacral surgical clips in soft tissue density remains stable, consistent with post treatment changes. Vascular/Lymphatic: No pathologically enlarged lymph nodes. No abdominal aortic aneurysm. Aortic atherosclerosis. Reproductive:  No mass or other significant abnormality. Other:  None. Musculoskeletal: No suspicious bone lesions identified. Lumbar spine degenerative changes and severe left hip osteoarthritis noted. IMPRESSION: No acute findings. Stable post treatment changes in presacral region. No evidence of recurrent or metastatic carcinoma. Stable left lower quadrant colostomy with large peristomal hernia containing colon. Electronically Signed   By: Earle Gell M.D.   On: 12/26/2017 16:40   Ct L-spine No Charge  Result Date: 12/26/2017 CLINICAL DATA:  Chronic low back pain.  Rectal cancer. EXAM: CT LUMBAR SPINE WITHOUT CONTRAST TECHNIQUE: Multidetector CT imaging of the lumbar spine was performed without intravenous contrast administration. Multiplanar CT image reconstructions were also generated. Images were generated from the same data set as the CT abdomen and pelvis of the same day. COMPARISON:  Lumbar spine radiographs 03/22/2017 FINDINGS: Segmentation: 5 non rib-bearing lumbar type vertebral bodies are present. The lowest fully formed vertebral body is L5. Alignment: AP alignment is anatomic. Levoconvex curvature is centered at L2-3.  Vertebrae: Vertebral body heights are maintained. Endplate sclerotic changes are noted on the right at L3-4. No focal lytic or blastic lesions are present. Paraspinal and other soft tissues: Atherosclerotic calcifications are present in the aorta. Renal cystic changes are present bilaterally. Please see dedicated CT abdomen report from the same day. Disc levels: There is acquired fusion across the disc space at L1-2 and L2-3. There is fusion across the disc space at L5-S1. Vacuum disc is present at L3-4 and L4-5. Right foraminal narrowing is present at L3-4 due to a rightward disc protrusion and asymmetric facet disease. Moderate central canal stenosis is present at L4-5 with left greater than right subarticular narrowing. Moderate foraminal stenosis is present bilaterally at L4-5 as well.  Bone spurs contribute to moderate left subarticular and foraminal stenosis at L5-S1. IMPRESSION: 1. No acute abnormality. 2. Moderate right foraminal stenosis at L3-4 due to a rightward disc protrusion and facet hypertrophy. 3. Moderate central canal narrowing with left greater than right subarticular narrowing and right greater than left moderate foraminal stenosis at L4-5. 4. Moderate left subarticular and foraminal stenosis at L5-S1 secondary to bone spurs and facet hypertrophy. Electronically Signed   By: San Morelle M.D.   On: 12/26/2017 16:16    Assessment/Plan   #1 continued hematuria-we are awaiting results of the urinalysis and culture- per review of meds he is not on anticoagulation he is on saw palmetto and per research  do see that there may be some slight risk of bleeding from this although this appears minimal however we will empirically hold this and monitor-I did discuss this with patient and he is agreeable.  Also will update a CBC with differential tomorrow to monitor his hemoglobin.  Nursing also did a bladder scan today which showed minimal retained urine.  2 history of back pain-again  patient has refused surgery-he is on prednisone tramadol has been started will make this routine twice daily nursing staff feels he would benefit from this.  3.  Diabetes type 2-since he is on prednisone will monitor CBGs before meals and at bedtime notify provider if CBG less than 60 or greater than 300-.  [id number] note greater than 25 minutes spent assessing patient- discussing his status with nursing staff-and coordinating and formulating a plan of care- of note greater than 50% of time spent coordinating plan of care with input as noted above

## 2018-01-02 ENCOUNTER — Encounter: Payer: Self-pay | Admitting: Internal Medicine

## 2018-01-02 ENCOUNTER — Encounter (HOSPITAL_COMMUNITY)
Admission: RE | Admit: 2018-01-02 | Discharge: 2018-01-02 | Disposition: A | Discharge status: Home / Self Care | Payer: Medicare HMO | Source: Skilled Nursing Facility | Attending: *Deleted | Admitting: *Deleted

## 2018-01-02 LAB — CBC WITH DIFFERENTIAL/PLATELET
BASOS PCT: 0 %
Basophils Absolute: 0 10*3/uL (ref 0.0–0.1)
EOS PCT: 0 %
Eosinophils Absolute: 0 10*3/uL (ref 0.0–0.7)
HEMATOCRIT: 35.4 % — AB (ref 39.0–52.0)
Hemoglobin: 12 g/dL — ABNORMAL LOW (ref 13.0–17.0)
Lymphocytes Relative: 14 %
Lymphs Abs: 1.7 10*3/uL (ref 0.7–4.0)
MCH: 29.9 pg (ref 26.0–34.0)
MCHC: 33.9 g/dL (ref 30.0–36.0)
MCV: 88.1 fL (ref 78.0–100.0)
MONOS PCT: 6 %
Monocytes Absolute: 0.7 10*3/uL (ref 0.1–1.0)
Neutro Abs: 10.1 10*3/uL — ABNORMAL HIGH (ref 1.7–7.7)
Neutrophils Relative %: 80 %
Platelets: 210 10*3/uL (ref 150–400)
RBC: 4.02 MIL/uL — ABNORMAL LOW (ref 4.22–5.81)
RDW: 12.7 % (ref 11.5–15.5)
WBC: 12.5 10*3/uL — ABNORMAL HIGH (ref 4.0–10.5)

## 2018-01-02 LAB — BASIC METABOLIC PANEL
BUN: 23 mg/dL — AB (ref 6–20)
CREATININE: 0.89 mg/dL (ref 0.61–1.24)
Calcium: 8.8 mg/dL — ABNORMAL LOW (ref 8.9–10.3)
Chloride: 100 mmol/L — ABNORMAL LOW (ref 101–111)
GFR calc Af Amer: >60 mL/min (ref >60)
GFR calc non Af Amer: >60 mL/min (ref >60)
Glucose, Bld: 189 mg/dL — ABNORMAL HIGH (ref 65–99)
Potassium: 4.6 mmol/L (ref 3.5–5.1)
Sodium: 134 mmol/L — ABNORMAL LOW (ref 135–145)

## 2018-01-04 ENCOUNTER — Encounter: Payer: Self-pay | Admitting: Internal Medicine

## 2018-01-04 ENCOUNTER — Non-Acute Institutional Stay (SKILLED_NURSING_FACILITY): Payer: Medicare HMO | Admitting: Internal Medicine

## 2018-01-04 ENCOUNTER — Encounter (HOSPITAL_COMMUNITY)
Admission: RE | Admit: 2018-01-04 | Discharge: 2018-01-04 | Disposition: A | Discharge status: Home / Self Care | Payer: Medicare HMO | Source: Skilled Nursing Facility | Attending: Internal Medicine | Admitting: Internal Medicine

## 2018-01-04 DIAGNOSIS — R319 Hematuria, unspecified: Secondary | ICD-10-CM | POA: Diagnosis not present

## 2018-01-04 DIAGNOSIS — M545 Low back pain, unspecified: Secondary | ICD-10-CM

## 2018-01-04 DIAGNOSIS — N400 Enlarged prostate without lower urinary tract symptoms: Secondary | ICD-10-CM | POA: Insufficient documentation

## 2018-01-04 DIAGNOSIS — D72829 Elevated white blood cell count, unspecified: Secondary | ICD-10-CM | POA: Diagnosis not present

## 2018-01-04 DIAGNOSIS — M5116 Intervertebral disc disorders with radiculopathy, lumbar region: Secondary | ICD-10-CM | POA: Insufficient documentation

## 2018-01-04 DIAGNOSIS — E785 Hyperlipidemia, unspecified: Secondary | ICD-10-CM | POA: Insufficient documentation

## 2018-01-04 DIAGNOSIS — E119 Type 2 diabetes mellitus without complications: Secondary | ICD-10-CM

## 2018-01-04 DIAGNOSIS — N390 Urinary tract infection, site not specified: Secondary | ICD-10-CM

## 2018-01-04 DIAGNOSIS — Z5189 Encounter for other specified aftercare: Secondary | ICD-10-CM | POA: Insufficient documentation

## 2018-01-04 LAB — CBC WITH DIFFERENTIAL/PLATELET
BASOS PCT: 0 %
Basophils Absolute: 0 10*3/uL (ref 0.0–0.1)
EOS PCT: 1 %
Eosinophils Absolute: 0.1 10*3/uL (ref 0.0–0.7)
HCT: 40.9 % (ref 39.0–52.0)
Hemoglobin: 13.5 g/dL (ref 13.0–17.0)
Lymphocytes Relative: 21 %
Lymphs Abs: 3 10*3/uL (ref 0.7–4.0)
MCH: 29.7 pg (ref 26.0–34.0)
MCHC: 33 g/dL (ref 30.0–36.0)
MCV: 89.9 fL (ref 78.0–100.0)
MONOS PCT: 7 %
Monocytes Absolute: 1 10*3/uL (ref 0.1–1.0)
NEUTROS PCT: 71 %
Neutro Abs: 9.9 10*3/uL — ABNORMAL HIGH (ref 1.7–7.7)
PLATELETS: 261 10*3/uL (ref 150–400)
RBC: 4.55 MIL/uL (ref 4.22–5.81)
RDW: 13 % (ref 11.5–15.5)
WBC: 14 10*3/uL — ABNORMAL HIGH (ref 4.0–10.5)

## 2018-01-04 LAB — URINE CULTURE

## 2018-01-04 NOTE — Progress Notes (Signed)
Location:     [facility] Room Number: 125/P Place of Service:  SNF [phone]) Provider:  Cory Roughen, MD  Patient Care Team: Celene Squibb, MD as PCP - General (Internal Medicine)  Extended Emergency Contact Information Primary Emergency Contact: Siedschlag,[name] Address: [address]          [place], [place] [address] [place] Phone: [phone] Relation: Spouse  Code Status:  Full Code Goals of care: Advanced Directive information Advanced Directives 01/04/2018  Does Patient Have a Medical Advance Directive? Yes  Type of Advance Directive (No Data)  Does patient want to make changes to medical advance directive? No - Patient declined  Would patient like information on creating a medical advance directive? No - Patient declined    Chief complaint-acute visit secondary to UTI-follow-up hematuria  HPI:  Pt is a 84 y.o. male seen today for an acute visit for  Follow-up of UTI with hematuria  Patient has a history of hypertension diabetes type 2 as well as a history of colostomy for rectal cancer back in 2005 and hyperlipidemia.  He is here for therapy after staying in the hospital for acute back pain.  Refused an MRI in the hospital for his back not interested in surgical intervention.  He was started on prednisone--and has been started on tramadol----he is now receiving it routinely twice a day nursing staff thought that would be more effective than PRN  Has also been noted to have some hematuria started in the hospital and has been on and off.  We did order a urine culture with has come back s howing greater than 100,000 colonies of Staphylococcus Saprophyticus  He still appears to be having some hematuria although this appears clearer  than it did last Friday  He is on Flomax and does have urology follow-up  Regards to back pain he says he is currently not having pain but with exertion and therapy says he does have  continued pain-.    Past Medical History:  Diagnosis Date  . Arthritis   . Cancer ([facility])    rectal  . Diabetes mellitus without complication ([facility])   . Hypertension    Past Surgical History:  Procedure Laterality Date  . BACK SURGERY    . CHOLECYSTECTOMY    . COLOSTOMY    . COLOSTOMY      Allergies  Allergen Reactions  . Sulfa Antibiotics Nausea Only    Outpatient Encounter Medications as of 01/04/2018  Medication Sig  . acetaminophen (TYLENOL) 325 MG tablet Take 650 mg by mouth 2 (two) times daily.  Roseanne Kaufman Peru-Castor Oil (VENELEX) OINT Apply to sacrum and bilateral buttocks every shift  . ciprofloxacin (CIPRO) 250 MG tablet Take 250 mg by mouth 2 (two) times daily.  . Cyanocobalamin (VITAMIN B 12 PO) Take 500 mcg by mouth daily. GUMMIES  . glyBURIDE-metformin (GLUCOVANCE) 5-500 MG tablet Give 1/2 tab (2.5-250 mg) by mouth once a morning  . lisinopril (PRINIVIL,ZESTRIL) 5 MG tablet Take 5 mg by mouth daily.  . metFORMIN (GLUCOPHAGE) 500 MG tablet Take 500 mg by mouth every evening.  . predniSONE (DELTASONE) 20 MG tablet Take 40mg  po daily for 2 days then 30mg  daily for 2 days then 20mg  daily for 2 days then 10mg  daily for 2 days then stop  . Probiotic Product (RISA-BID PROBIOTIC) TABS Take by mouth. Take 1 tablet by mouth twice a day  . tamsulosin (FLOMAX) 0.4 MG CAPS capsule Take  1 capsule (0.4 mg total) by mouth daily.  . traMADol (ULTRAM) 50 MG tablet Take 50 mg by mouth 2 (two) times daily.  . Vitamin D, Cholecalciferol, 1000 UNITS TABS Take 1,000 Units by mouth every morning.   . [DISCONTINUED] Saw Palmetto 450 MG CAPS Take 450 mg by mouth every morning.   . [DISCONTINUED] traMADol (ULTRAM) 50 MG tablet Take 1 tablet (50 mg total) by mouth every 6 (six) hours as needed for moderate pain. (Patient taking differently: Take 50 mg by mouth 2 (two) times daily. )   No facility-administered encounter medications on file as of 01/04/2018.     Review of Systems General is  not complaining of any fever chills does complain of feeling weak  Skin is not complaining of rashes or itching   head ears eyes nose mouth and throat does not complain of visual changes sore throat or difficulty swallowing  Respiratory is not complaining of shortness of breath or cough.  Cardiac does not complain of chest pain appears to have some mild lower extremity edema   GI does not complain of abdominal discomfort nausea vomiting diarrhea constipation  GU does not really complain of significant dysuria- he does have a condom cath  Musculoskeletal-continues to complain of back pain with therapy and significant exertion- he is now resting and does not complain of pain.  Neurologic is not complaining of dizziness headache numbness or syncope.  Psych-continues with somewhat of a flat affect somewhat frustrated with his back pain does not want surgery  There is no immunization history on file for this patient. Pertinent  Health Maintenance Due  Topic Date Due  . FOOT EXAM  01/30/2018 (Originally 01/03/1944)  . HEMOGLOBIN A1C  01/30/2018 (Originally 23-Apr-1934)  . OPHTHALMOLOGY EXAM  01/30/2018 (Originally 01/03/1944)  . PNA vac Low Risk Adult (1 of 2 - PCV13) 01/30/2018 (Originally 01/03/2001)  . URINE MICROALBUMIN  01/31/2018 (Originally 01/03/1944)  . INFLUENZA VACCINE  02/18/2018   No flowsheet data found. Functional Status Survey:    Vitals:   01/04/18 1431  BP: 118/76  Pulse: 75  Resp: 18  Temp: 97.8 F (36.6 C)  TempSrc: Oral  SpO2: 96%    Physical Exam  In general this is a pleasant somewhat frail-appearing elderly male in no distress lying comfortably in bed.  His skin is warm and dry with baseline solar induced changes.  Eyes visual acuity appears grossly intact sclera and conjunctive are clear.  Oropharynx is clear mucous membranes moist.  Chest is clear to auscultation there is no labored breathing.  Heart is regular rate and rhythm without murmur  gallop or rub he has mild lower extremity edema.  Abdomen-is soft nontender with positive bowel sounds  GU-could not really appreciate suprapubic tenderness he does have a catheter draining very lightly blood tinged urine.   Mus-- Skeletal limited exam since he is in bed but is able to move all extremities and appears at baseline he does have baseline contractures of his second fingers bilaterally  Neurologic is grossly intact his speech is clear touch sensation appears to be intact.  Psych he is alert and oriented pleasant and appropriate again somewhat of a flat affect- he finds therapy challenging and he has just returned from therapy probably contributing to that       Labs reviewed: Recent Labs    03/22/17 1355 12/26/17 1335 01/02/18 0257  NA 137 138 134*  K 4.0 3.9 4.6  CL 103 105 100*  CO2 26 23 TEST NOT  PERFORMED, REAGENT NOT AVAILABLE  GLUCOSE 149* 83 189*  BUN 16 14 23*  CREATININE 0.86 0.84 0.89  CALCIUM 8.8* 9.2 8.8*   Recent Labs    12/26/17 1335  AST 22  ALT 19  ALKPHOS 98  BILITOT 1.0  PROT 6.6  ALBUMIN 3.4*   Recent Labs    12/26/17 1335 01/02/18 0257 01/04/18 0725  WBC 8.9 12.5* 14.0*  NEUTROABS 5.3 10.1* 9.9*  HGB 12.4* 12.0* 13.5  HCT 37.5* 35.4* 40.9  MCV 89.1 88.1 89.9  PLT 194 210 261   No results found for: TSH No results found for: HGBA1C No results found for: CHOL, HDL, LDLCALC, LDLDIRECT, TRIG, CHOLHDL  Significant Diagnostic Results in last 30 days:  Ct Abdomen Pelvis W Contrast  Result Date: 12/26/2017 CLINICAL DATA:  Worsening abdominal and back pain. Personal history of rectal carcinoma. EXAM: CT ABDOMEN AND PELVIS WITH CONTRAST TECHNIQUE: Multidetector CT imaging of the abdomen and pelvis was performed using the standard protocol following bolus administration of intravenous contrast. CONTRAST:  193mL ISOVUE-300 IOPAMIDOL (ISOVUE-300) INJECTION 61% COMPARISON:  PET-CT on 01/15/2007 FINDINGS: Lower Chest: No acute findings.  Hepatobiliary: No hepatic masses identified. Prior cholecystectomy. No evidence of biliary obstruction. Pancreas:  No mass or inflammatory changes. Spleen: Within normal limits in size and appearance. Adrenals/Urinary Tract: No masses identified. Bilateral lower pole renal cyst noted. No evidence of hydronephrosis. Stomach/Bowel: Postop changes from previous sigmoid colectomy with left lower quadrant colostomy. A large peristomal hernia is again seen containing sigmoid colon. No evidence of diverticulitis or bowel obstruction. Presacral surgical clips in soft tissue density remains stable, consistent with post treatment changes. Vascular/Lymphatic: No pathologically enlarged lymph nodes. No abdominal aortic aneurysm. Aortic atherosclerosis. Reproductive:  No mass or other significant abnormality. Other:  None. Musculoskeletal: No suspicious bone lesions identified. Lumbar spine degenerative changes and severe left hip osteoarthritis noted. IMPRESSION: No acute findings. Stable post treatment changes in presacral region. No evidence of recurrent or metastatic carcinoma. Stable left lower quadrant colostomy with large peristomal hernia containing colon. Electronically Signed   By: Earle Gell M.D.   On: 12/26/2017 16:40   Ct L-spine No Charge  Result Date: 12/26/2017 CLINICAL DATA:  Chronic low back pain.  Rectal cancer. EXAM: CT LUMBAR SPINE WITHOUT CONTRAST TECHNIQUE: Multidetector CT imaging of the lumbar spine was performed without intravenous contrast administration. Multiplanar CT image reconstructions were also generated. Images were generated from the same data set as the CT abdomen and pelvis of the same day. COMPARISON:  Lumbar spine radiographs 03/22/2017 FINDINGS: Segmentation: 5 non rib-bearing lumbar type vertebral bodies are present. The lowest fully formed vertebral body is L5. Alignment: AP alignment is anatomic. Levoconvex curvature is centered at L2-3. Vertebrae: Vertebral body heights are  maintained. Endplate sclerotic changes are noted on the right at L3-4. No focal lytic or blastic lesions are present. Paraspinal and other soft tissues: Atherosclerotic calcifications are present in the aorta. Renal cystic changes are present bilaterally. Please see dedicated CT abdomen report from the same day. Disc levels: There is acquired fusion across the disc space at L1-2 and L2-3. There is fusion across the disc space at L5-S1. Vacuum disc is present at L3-4 and L4-5. Right foraminal narrowing is present at L3-4 due to a rightward disc protrusion and asymmetric facet disease. Moderate central canal stenosis is present at L4-5 with left greater than right subarticular narrowing. Moderate foraminal stenosis is present bilaterally at L4-5 as well. Bone spurs contribute to moderate left subarticular and foraminal stenosis at  L5-S1. IMPRESSION: 1. No acute abnormality. 2. Moderate right foraminal stenosis at L3-4 due to a rightward disc protrusion and facet hypertrophy. 3. Moderate central canal narrowing with left greater than right subarticular narrowing and right greater than left moderate foraminal stenosis at L4-5. 4. Moderate left subarticular and foraminal stenosis at L5-S1 secondary to bone spurs and facet hypertrophy. Electronically Signed   By: San Morelle M.D.   On: 12/26/2017 16:16    Assessment/Plan  #1 UTI with hematuria- he has been started on Cipro for a 7-day course- does not complain.  Today the dysuria appears to be improved although there is still some slight blood-tinged to the urine.  Updated labs did show a mildly elevated white count of 14,000- he is also on prednisone which may be contributing to this he is afebrile and does not give a septic presentation at this point will monitor will update a CBC with differential next week  Of note his hemoglobin remains stable at 13.4   #2 leukocytosis this could be multifactorial as he is on prednisone as well as has  UTI-again  will update this in a week.  3.  2 diabetes- is on glyburide Glucophage CBG's  have been I mostly the mid 100s I do see 225 last night appears to be somewhat higher sugars at times later in a day at this point will monitor so far today blood sugars have been 10 8 in the morning and 167 this noon  #4 pain management-he is on prednisone again he has refused surgical intervention for his back pain- currently is denying pain but does have significant pain with activity-at this point will continue the tramadol and monitor he also has Tylenol twice a day  [phone]

## 2018-01-06 ENCOUNTER — Non-Acute Institutional Stay (SKILLED_NURSING_FACILITY): Payer: Medicare HMO | Admitting: Internal Medicine

## 2018-01-06 DIAGNOSIS — N390 Urinary tract infection, site not specified: Secondary | ICD-10-CM | POA: Diagnosis not present

## 2018-01-06 DIAGNOSIS — E119 Type 2 diabetes mellitus without complications: Secondary | ICD-10-CM | POA: Diagnosis not present

## 2018-01-06 DIAGNOSIS — M545 Low back pain, unspecified: Secondary | ICD-10-CM

## 2018-01-06 DIAGNOSIS — R319 Hematuria, unspecified: Secondary | ICD-10-CM | POA: Diagnosis not present

## 2018-01-06 NOTE — Progress Notes (Signed)
This is an acute visit  LOC-Skilled  Facillity-[facility]   Chief complaint- acute visit follow-up blood sugars- as well as hematuria with UTI.    History of present illness.  Pleasant 84 year old male seen today for follow-up of blood sugars as well as hematuria with a history of UTI.  Patient has a history of hypertension diabetes type 2 as well as a history of colostomy for rectal cancer back in 2005 and hyperlipidemia.  He is here for therapy after staying in the hospital for acute back pain.  Refused an MRI in the hospital for his back not interested in surgical intervention.  He was started on prednisone--and has been started on tramadol----he is now receiving it routinely twice a day --it appears this is helping some  Has also been noted to have some hematuria started in the hospital and has been on and off.  We did order a urine culture with has come back s howing greater than 100,000 colonies of Staphylococcus Saprophyticus--he has been started on ciprofloxacin and the hematuria appears to have largely cleared up  He is on Flomax and will have urology follow-up   regards to type 2 diabetes he is on a glyburide- metformin combination in the morning and on metformin at night  Appears yesterday he had a blood sugar over 300 a couple times but this appears to have moderated-today morning blood sugar was 118-at noon was 124-at 5 PM 2 11 per review of several days his blood sugars largely run in the mid 100s with occasional readings above 200 but not consistently  Currently he appears to be feeling a bit better he is in his bed actually eating some potato chips appears to be in better spirits       Past Medical History:  Diagnosis Date  . Arthritis   . Cancer ([facility])    rectal  . Diabetes mellitus without complication ([facility])   . Hypertension         Past Surgical History:  Procedure Laterality Date  . BACK SURGERY    . CHOLECYSTECTOMY      . COLOSTOMY    . COLOSTOMY          Allergies  Allergen Reactions  . Sulfa Antibiotics Nausea Only           Medication Sig  . acetaminophen (TYLENOL) 325 MG tablet Take 650 mg by mouth 2 (two) times daily.  Roseanne Kaufman [place]-Castor Oil (VENELEX) OINT Apply to sacrum and bilateral buttocks every shift  . ciprofloxacin (CIPRO) 250 MG tablet Take 250 mg by mouth 2 (two) times daily.  . Cyanocobalamin (VITAMIN B 12 PO) Take 500 mcg by mouth daily. GUMMIES  . glyBURIDE-metformin (GLUCOVANCE) 5-500 MG tablet Give 1/2 tab (2.5-250 mg) by mouth once a morning  . lisinopril (PRINIVIL,ZESTRIL) 5 MG tablet Take 5 mg by mouth daily.  . metFORMIN (GLUCOPHAGE) 500 MG tablet Take 500 mg by mouth every evening.  . predniSONE (DELTASONE) 20 MG tablet Take 40mg  po daily for 2 days then 30mg  daily for 2 days then 20mg  daily for 2 days then 10mg  daily for 2 days then stop  . Probiotic Product (RISA-BID PROBIOTIC) TABS Take by mouth. Take 1 tablet by mouth twice a day  . tamsulosin (FLOMAX) 0.4 MG CAPS capsule Take 1 capsule (0.4 mg total) by mouth daily.  . traMADol (ULTRAM) 50 MG tablet Take 50 mg by mouth 2 (two) times daily.  . Vitamin D, Cholecalciferol, 1000 UNITS TABS  Take 1,000 Units by mouth every morning.   . [DISCONTINUED] Saw Palmetto 450 MG CAPS Take 450 mg by mouth every morning.   . [DISCONTINUED] traMADol (ULTRAM) 50 MG tablet Take 1 tablet (50 mg total) by mouth every 6 (six) hours as needed for moderate pain. (Patient taking differently: Take 50 mg by mouth 2 (two) times daily. )   No facility-administered encounter medications on file as of 01/04/2018.     Review of Systems General is not complaining of any fever chills says he feels a bit stronger  Skin is not complaining of rashes or itching   head ears eyes nose mouth and throat does not complain of visual changes sore throat or difficulty swallowing  Respiratory is not complaining of shortness of breath or  cough.  Cardiac does not complain of chest pain appears to have some mild lower extremity edema this appears relatively unchanged from previous exam   GI does not complain of abdominal discomfort nausea vomiting diarrhea constipation  GU does not really complain of significant dysuria- he does have a condom cath--hematuria appears to be largely resolved  Musculoskeletal-continues to complain of back pain with therapy and significant exertion- he is now resting and does not complain of pain.  Neurologic is not complaining of dizziness headache numbness or syncope.  Psych- does not complain of being depressed or anxious-appears to be in better spirits than when I saw him last time  There is no immunization history on file for this patient.     Pertinent  Health Maintenance Due  Topic Date Due  . FOOT EXAM  01/30/2018 (Originally 01/03/1944)  . HEMOGLOBIN A1C  01/30/2018 (Originally 1934-02-21)  . OPHTHALMOLOGY EXAM  01/30/2018 (Originally 01/03/1944)  . PNA vac Low Risk Adult (1 of 2 - PCV13) 01/30/2018 (Originally 01/03/2001)  . URINE MICROALBUMIN  01/31/2018 (Originally 01/03/1944)  . INFLUENZA VACCINE  02/18/2018   No flowsheet data found. Functional Status Survey:                             Temperature is 97.3 pulse 84 respirations 18 blood pressure 126/73  Physical Exam  In general this is a pleasant somewhat frail-appearing elderly male in no distress lying comfortably in bed.--Eating potato chips and a sandwich  His skin is warm and dry with baseline solar induced changes.  Eyes visual acuity appears grossly intact sclera and conjunctive are clear.  Oropharynx is clear mucous membranes moist.  Chest is clear to auscultation there is no labored breathing.  Heart is regular rate and rhythm without murmur gallop or rub he has mild lower extremity edema.  Abdomen-is soft nontender with positive bowel sounds  GU-could not really appreciate suprapubic  tenderness he does have a catheter draining somewhat dark amber-colored urine-this appears to be improved from previous exam--   Muscle---- Skeletal limited exam since he is in bed but is able to move all extremities and appears at baseline he does have baseline contractures of his second fingers bilaterally Could not really appreciate deformity or tenderness to palpation of the back  Neurologic is grossly intact his speech is clear touch sensation appears to be intact.  Psych he is alert and oriented pleasant and appropriate Appears to be in better spirits and appears to be having a better appetite as well   Labs.  January 04, 2018.  WBC 14.0-hemoglobin 13.5- platelets 261.  January 02, 2018.  Sodium 134 potassium 4.6 BUN 23 creatinine 0.89  Assessment and plan.  1.-  History of UTI- appears to be resolving he does have urology follow-up and continues on Flomax   He is completing a course of Cipro  #2 history of type 2 diabetes-this appears relatively stable I suspect prednisone is leading to some occasional spikes but these do not appear to be consistent.  At this point will monitor and continue current medications  Notify provider if CBG less than 60 or greater than 300- CBGs before meals and at bedtime for now    3 back pain-this appears to be improving somewhat he is receiving tramadol he is also on prednisone  #4 leukocytosis suspect this may be combination of the UTI as well as prednisone- update CBC with differential is pending--he does not show signs of sepsis   [id number]

## 2018-01-07 ENCOUNTER — Encounter: Payer: Self-pay | Admitting: Internal Medicine

## 2018-01-08 ENCOUNTER — Ambulatory Visit (INDEPENDENT_AMBULATORY_CARE_PROVIDER_SITE_OTHER): Payer: Medicare HMO | Admitting: Urology

## 2018-01-08 DIAGNOSIS — R338 Other retention of urine: Secondary | ICD-10-CM | POA: Diagnosis not present

## 2018-01-08 DIAGNOSIS — R310 Gross hematuria: Secondary | ICD-10-CM

## 2018-01-11 ENCOUNTER — Encounter (HOSPITAL_COMMUNITY)
Admission: RE | Admit: 2018-01-11 | Discharge: 2018-01-11 | Disposition: A | Discharge status: Home / Self Care | Payer: Medicare HMO | Source: Skilled Nursing Facility | Attending: *Deleted | Admitting: *Deleted

## 2018-01-12 ENCOUNTER — Encounter (HOSPITAL_COMMUNITY)
Admission: RE | Admit: 2018-01-12 | Discharge: 2018-01-12 | Disposition: A | Discharge status: Home / Self Care | Payer: Medicare HMO | Source: Skilled Nursing Facility | Attending: Internal Medicine | Admitting: Internal Medicine

## 2018-01-12 DIAGNOSIS — Z433 Encounter for attention to colostomy: Secondary | ICD-10-CM | POA: Insufficient documentation

## 2018-01-12 DIAGNOSIS — Z5189 Encounter for other specified aftercare: Secondary | ICD-10-CM | POA: Insufficient documentation

## 2018-01-12 DIAGNOSIS — M5116 Intervertebral disc disorders with radiculopathy, lumbar region: Secondary | ICD-10-CM | POA: Insufficient documentation

## 2018-01-12 DIAGNOSIS — M545 Low back pain: Secondary | ICD-10-CM | POA: Insufficient documentation

## 2018-01-12 LAB — CBC WITH DIFFERENTIAL/PLATELET
Basophils Absolute: 0 10*3/uL (ref 0.0–0.1)
Basophils Relative: 0 %
Eosinophils Absolute: 0.5 10*3/uL (ref 0.0–0.7)
Eosinophils Relative: 5 %
HEMATOCRIT: 36.1 % — AB (ref 39.0–52.0)
Hemoglobin: 11.9 g/dL — ABNORMAL LOW (ref 13.0–17.0)
LYMPHS ABS: 2 10*3/uL (ref 0.7–4.0)
Lymphocytes Relative: 21 %
MCH: 29.8 pg (ref 26.0–34.0)
MCHC: 33 g/dL (ref 30.0–36.0)
MCV: 90.5 fL (ref 78.0–100.0)
MONO ABS: 0.9 10*3/uL (ref 0.1–1.0)
MONOS PCT: 9 %
NEUTROS ABS: 6.3 10*3/uL (ref 1.7–7.7)
Neutrophils Relative %: 65 %
Platelets: 188 10*3/uL (ref 150–400)
RBC: 3.99 MIL/uL — ABNORMAL LOW (ref 4.22–5.81)
RDW: 13.3 % (ref 11.5–15.5)
WBC: 9.6 10*3/uL (ref 4.0–10.5)

## 2018-01-12 LAB — BASIC METABOLIC PANEL
Anion gap: 8 (ref 5–15)
BUN: 22 mg/dL (ref 8–23)
CALCIUM: 8.7 mg/dL — AB (ref 8.9–10.3)
CO2: 27 mmol/L (ref 22–32)
Chloride: 101 mmol/L (ref 98–111)
Creatinine, Ser: 0.84 mg/dL (ref 0.61–1.24)
GFR calc Af Amer: >60 mL/min (ref >60)
GFR calc non Af Amer: >60 mL/min (ref >60)
GLUCOSE: 110 mg/dL — AB (ref 70–99)
Potassium: 4.4 mmol/L (ref 3.5–5.1)
Sodium: 136 mmol/L (ref 135–145)

## 2018-01-13 ENCOUNTER — Encounter (HOSPITAL_COMMUNITY)
Admission: RE | Admit: 2018-01-13 | Discharge: 2018-01-13 | Disposition: A | Discharge status: Home / Self Care | Payer: Medicare HMO | Source: Skilled Nursing Facility | Attending: *Deleted | Admitting: *Deleted

## 2018-01-14 LAB — URINE CULTURE

## 2018-01-18 ENCOUNTER — Non-Acute Institutional Stay (SKILLED_NURSING_FACILITY): Payer: Medicare HMO | Admitting: Internal Medicine

## 2018-01-18 ENCOUNTER — Encounter: Payer: Self-pay | Admitting: Internal Medicine

## 2018-01-18 DIAGNOSIS — M545 Low back pain, unspecified: Secondary | ICD-10-CM

## 2018-01-18 DIAGNOSIS — I10 Essential (primary) hypertension: Secondary | ICD-10-CM

## 2018-01-18 DIAGNOSIS — E1169 Type 2 diabetes mellitus with other specified complication: Secondary | ICD-10-CM

## 2018-01-18 DIAGNOSIS — Z87440 Personal history of urinary (tract) infections: Secondary | ICD-10-CM | POA: Diagnosis not present

## 2018-01-18 DIAGNOSIS — E785 Hyperlipidemia, unspecified: Secondary | ICD-10-CM | POA: Diagnosis not present

## 2018-01-18 DIAGNOSIS — E119 Type 2 diabetes mellitus without complications: Secondary | ICD-10-CM

## 2018-01-18 NOTE — Progress Notes (Signed)
Location:   [facility] Room Number: 125/P Place of Service:  SNF [phone])  Provider: Granville Lewis  PCP: Celene Squibb, MD Patient Care Team: Celene Squibb, MD as PCP - General (Internal Medicine)  Extended Emergency Contact Information Primary Emergency Contact: Broccoli,[name] Address: [address]          [place], [place] [address] [place] Phone: [phone] Relation: Spouse  Code Status: Full Code Goals of care:  Advanced Directive information Advanced Directives 01/04/2018  Does Patient Have a Medical Advance Directive? Yes  Type of Advance Directive (No Data)  Does patient want to make changes to medical advance directive? No - Patient declined  Would patient like information on creating a medical advance directive? No - Patient declined     Allergies  Allergen Reactions  . Sulfa Antibiotics Nausea Only    Chief Complaint  Patient presents with  . Discharge Note    Discharge Visit    HPI:  84 y.o. male seen today for discharge from facility tomorrow.  Patient was here for rehab after having significant back pain--he came to the ED secondary to worsening pain he could not even take a shower  He did refuse an MRI in the hospital and was not interested in surgical intervention  He did receive prednisone which he completed once he was discharged to skilled nursing- he was also started on tramadol routinely he says his pain is significantly improved he is working with therapy and now ambulating with a walker about 140-150 feet  CT scan did show central canal narrowing with foraminal stenosis.  He will need continued PT and OT when he goes home as well as a rolling walker.  Acute issue otherwise during her stay here was some persistent hematuria- we did do a urine culture which came back positive for greater than 100,000 colonies of StaphylococcusSaprophyticus He was treated with Cipro and repeat urine culture did not really show  significant growth.  The hematuria has cleared up.  He is also seeing urology and they did increase his Flomax to twice daily.  He also has a type II diabetic and continues on glyburide in Glucophage blood sugars appear to run 60s to low 100s.  He also has a history of hyperlipidemia but did not want to take his statin says he was not taking it at home and thus was discontinued -- will warrant follow-up by his primary care provider  He also has a history of hypertension is on lisinopril and appears to be doing well with this I got a manual reading of 122/74-previous readings 131/74-100/63- 121/70 he appears at one point he had some systolics under 892 but has not had any that low recently   Currently he is resting in bed comfortably has no acute complaints and is looking forward to going home he does live with his wife again will need continued therapy as well as a rolling walker                  Past Medical History:  Diagnosis Date  . Arthritis   . Cancer ([facility])    rectal  . Diabetes mellitus without complication ([facility])   . Hypertension     Past Surgical History:  Procedure Laterality Date  . BACK SURGERY    . CHOLECYSTECTOMY    . COLOSTOMY    . COLOSTOMY        reports that he has never smoked. He has never used  smokeless tobacco. He reports that he does not drink alcohol or use drugs. Social History   Socioeconomic History  . Marital status: Married    Spouse name: Not on file  . Number of children: Not on file  . Years of education: Not on file  . Highest education level: Not on file  Occupational History  . Not on file  Social Needs  . Financial resource strain: Not on file  . Food insecurity:    Worry: Not on file    Inability: Not on file  . Transportation needs:    Medical: Not on file    Non-medical: Not on file  Tobacco Use  . Smoking status: Never Smoker  . Smokeless tobacco: Never Used  Substance and Sexual Activity  . Alcohol use: No  .  Drug use: No  . Sexual activity: Not on file  Lifestyle  . Physical activity:    Days per week: Not on file    Minutes per session: Not on file  . Stress: Not on file  Relationships  . Social connections:    Talks on phone: Not on file    Gets together: Not on file    Attends religious service: Not on file    Active member of club or organization: Not on file    Attends meetings of clubs or organizations: Not on file    Relationship status: Not on file  . Intimate partner violence:    Fear of current or ex partner: Not on file    Emotionally abused: Not on file    Physically abused: Not on file    Forced sexual activity: Not on file  Other Topics Concern  . Not on file  Social History Narrative  . Not on file   Functional Status Survey:    Allergies  Allergen Reactions  . Sulfa Antibiotics Nausea Only    Pertinent  Health Maintenance Due  Topic Date Due  . FOOT EXAM  01/30/2018 (Originally 01/03/1944)  . HEMOGLOBIN A1C  01/30/2018 (Originally [birth year])  . OPHTHALMOLOGY EXAM  01/30/2018 (Originally 01/03/1944)  . PNA vac Low Risk Adult (1 of 2 - PCV13) 01/30/2018 (Originally 01/03/1999)  . URINE MICROALBUMIN  01/31/2018 (Originally 01/03/1944)  . INFLUENZA VACCINE  02/18/2018    Medications: Outpatient Encounter Medications as of 01/18/2018  Medication Sig  . acetaminophen (TYLENOL) 325 MG tablet Take 650 mg by mouth 2 (two) times daily.  Roseanne Kaufman [place]-Castor Oil (VENELEX) OINT Apply to sacrum and bilateral buttocks every shift  . Cyanocobalamin (VITAMIN B 12 PO) Take 500 mcg by mouth daily. GUMMIES  . glyBURIDE-metformin (GLUCOVANCE) 5-500 MG tablet Give 1/2 tab (2.5-250 mg) by mouth once a morning  . lisinopril (PRINIVIL,ZESTRIL) 5 MG tablet Take 5 mg by mouth daily.  . metFORMIN (GLUCOPHAGE) 500 MG tablet Take 500 mg by mouth every evening.  . tamsulosin (FLOMAX) 0.4 MG CAPS capsule Take 1 capsule (0.4 mg total) by mouth daily.  . traMADol (ULTRAM) 50 MG tablet Take  50 mg by mouth 2 (two) times daily.  . Vitamin D, Cholecalciferol, 1000 UNITS TABS Take 1,000 Units by mouth every morning.   . [DISCONTINUED] ciprofloxacin (CIPRO) 250 MG tablet Take 250 mg by mouth 2 (two) times daily.  . [DISCONTINUED] predniSONE (DELTASONE) 20 MG tablet Take 40mg  po daily for 2 days then 30mg  daily for 2 days then 20mg  daily for 2 days then 10mg  daily for 2 days then stop  . [DISCONTINUED] Probiotic Product (RISA-BID PROBIOTIC) TABS Take by  mouth. Take 1 tablet by mouth twice a day   No facility-administered encounter medications on file as of 01/18/2018.      Review of Systems   In general is not complaining of any fever chills his back pain is improved.  Skin does not complain of rashes or itching.  Head ears eyes nose mouth and throat does not complain of visual changes or sore throat  Respiratory is not complaining of shortness of breath or cough.  Cardiac is not complaining of chest pain some mild lower extremity edema.  GI does not complain of abdominal pain apparently appetite has improved does not complain of nausea vomiting diarrhea or constipation.  GU does not complain of dysuria again hematuria appears to be resolved  Musculoskeletal-says back pain has improved he is now ambulating with a rolling walker  Neurologic is not complaining of dizziness headache or numbness.  Psych --does not complain of being anxious or depressed-- he is looking forward to going home    Physical exam  Vitals:   01/18/18 1218  BP: 106/71  Pulse: 84  Resp: 18  Temp: 97.6 F (36.4 C)  TempSrc: Oral   Weight is 166 pounds Physical Exam  In general this is a pleasant elderly male in no distress he appears fairly well-nourished.  His skin is warm and dry.  Eyes visual acuity appears to be intact sclera and conjunctive are clear.  Oropharynx is clear mucous membranes moist.  Chest is clear to auscultation there is no labored breathing.  Heart is regular rate  and rhythm without murmur gallop or rub he has mild lower extremity edema bilaterally pedal pulses are intact bilaterally.  Abdomen is soft nontender with positive bowel sounds.  GU could not really appreciate suprapubic tenderness or distention- urinal has amber-colored urine  Musculoskeletal is able to move all extremities x4 strength appears to be intact all 4 extremities. He has contractures of his second fingers bilaterally which are chronic   Neurologic is grossly intact his speech is clear no lateralizing findings.  Psych he is alert and oriented pleasant and appropriate   Labs reviewed: Basic Metabolic Panel: Recent Labs    12/26/17 1335 01/02/18 0257 01/12/18 0724  NA 138 134* 136  K 3.9 4.6 4.4  CL 105 100* 101  CO2 23 TEST NOT PERFORMED, REAGENT NOT AVAILABLE 27  GLUCOSE 83 189* 110*  BUN 14 23* 22  CREATININE 0.84 0.89 0.84  CALCIUM 9.2 8.8* 8.7*   Liver Function Tests: Recent Labs    12/26/17 1335  AST 22  ALT 19  ALKPHOS 98  BILITOT 1.0  PROT 6.6  ALBUMIN 3.4*   No results for input(s): LIPASE, AMYLASE in the last 8760 hours. No results for input(s): AMMONIA in the last 8760 hours. CBC: Recent Labs    01/02/18 0257 01/04/18 0725 01/12/18 0724  WBC 12.5* 14.0* 9.6  NEUTROABS 10.1* 9.9* 6.3  HGB 12.0* 13.5 11.9*  HCT 35.4* 40.9 36.1*  MCV 88.1 89.9 90.5  PLT 210 261 188   Cardiac Enzymes: No results for input(s): CKTOTAL, CKMB, CKMBINDEX, TROPONINI in the last 8760 hours. BNP: Invalid input(s): POCBNP CBG: Recent Labs    12/29/17 2121 12/30/17 0752 12/30/17 1129  GLUCAP 196* 126* 330*    Procedures and Imaging Studies During Stay: Ct Abdomen Pelvis W Contrast  Result Date: 12/26/2017 CLINICAL DATA:  Worsening abdominal and back pain. Personal history of rectal carcinoma. EXAM: CT ABDOMEN AND PELVIS WITH CONTRAST TECHNIQUE: Multidetector CT imaging of the abdomen  and pelvis was performed using the standard protocol following bolus  administration of intravenous contrast. CONTRAST:  140mL ISOVUE-300 IOPAMIDOL (ISOVUE-300) INJECTION 61% COMPARISON:  PET-CT on 01/15/2007 FINDINGS: Lower Chest: No acute findings. Hepatobiliary: No hepatic masses identified. Prior cholecystectomy. No evidence of biliary obstruction. Pancreas:  No mass or inflammatory changes. Spleen: Within normal limits in size and appearance. Adrenals/Urinary Tract: No masses identified. Bilateral lower pole renal cyst noted. No evidence of hydronephrosis. Stomach/Bowel: Postop changes from previous sigmoid colectomy with left lower quadrant colostomy. A large peristomal hernia is again seen containing sigmoid colon. No evidence of diverticulitis or bowel obstruction. Presacral surgical clips in soft tissue density remains stable, consistent with post treatment changes. Vascular/Lymphatic: No pathologically enlarged lymph nodes. No abdominal aortic aneurysm. Aortic atherosclerosis. Reproductive:  No mass or other significant abnormality. Other:  None. Musculoskeletal: No suspicious bone lesions identified. Lumbar spine degenerative changes and severe left hip osteoarthritis noted. IMPRESSION: No acute findings. Stable post treatment changes in presacral region. No evidence of recurrent or metastatic carcinoma. Stable left lower quadrant colostomy with large peristomal hernia containing colon. Electronically Signed   By: Earle Gell M.D.   On: 12/26/2017 16:40   Ct L-spine No Charge  Result Date: 12/26/2017 CLINICAL DATA:  Chronic low back pain.  Rectal cancer. EXAM: CT LUMBAR SPINE WITHOUT CONTRAST TECHNIQUE: Multidetector CT imaging of the lumbar spine was performed without intravenous contrast administration. Multiplanar CT image reconstructions were also generated. Images were generated from the same data set as the CT abdomen and pelvis of the same day. COMPARISON:  Lumbar spine radiographs 03/22/2017 FINDINGS: Segmentation: 5 non rib-bearing lumbar type vertebral bodies  are present. The lowest fully formed vertebral body is L5. Alignment: AP alignment is anatomic. Levoconvex curvature is centered at L2-3. Vertebrae: Vertebral body heights are maintained. Endplate sclerotic changes are noted on the right at L3-4. No focal lytic or blastic lesions are present. Paraspinal and other soft tissues: Atherosclerotic calcifications are present in the aorta. Renal cystic changes are present bilaterally. Please see dedicated CT abdomen report from the same day. Disc levels: There is acquired fusion across the disc space at L1-2 and L2-3. There is fusion across the disc space at L5-S1. Vacuum disc is present at L3-4 and L4-5. Right foraminal narrowing is present at L3-4 due to a rightward disc protrusion and asymmetric facet disease. Moderate central canal stenosis is present at L4-5 with left greater than right subarticular narrowing. Moderate foraminal stenosis is present bilaterally at L4-5 as well. Bone spurs contribute to moderate left subarticular and foraminal stenosis at L5-S1. IMPRESSION: 1. No acute abnormality. 2. Moderate right foraminal stenosis at L3-4 due to a rightward disc protrusion and facet hypertrophy. 3. Moderate central canal narrowing with left greater than right subarticular narrowing and right greater than left moderate foraminal stenosis at L4-5. 4. Moderate left subarticular and foraminal stenosis at L5-S1 secondary to bone spurs and facet hypertrophy. Electronically Signed   By: San Morelle M.D.   On: 12/26/2017 16:16    Assessment/Plan:     #1- history of acute back pain-this appears to be improved he does receive tramadol twice a day he is now ambulating up to 150 feet with a rolling walker-he will need continued PT and OT as well as a rolling walker when he goes home.  2.  Type 2 diabetes he is on glyburide as well as Glucophage-blood sugars appear to run in the 60s to low 100s-.  he has frequent readings under 100-secondary to this and  advanced age will DC the glyburide portion and continue with metformin 500 twice daily-with expedient follow-up by primary care provider--per discussion with patient glyburide was added because of higher blood sugars- and this may have to be adjusted again I have advised him to try to check his blood pressures frequently until seen by primary care provider but would like to avoid hypoglycemia in the interim.  This was discussed with Dr. Linna Darner  #3 hypertension this appears stable as noted above on lisinopril.  4.-  History of dysuria with UTI- completed a course of Cipro repeat urine culture did not show significant growth- hematuria appears resolved- graft he is also been seen by urology his Flomax has been increased to twice daily  #5 hyperlipidemia- again patient has refused his statin saying he did not take it at home and thus been discontinued-will defer follow-up to primary care provider   [id number] note greater than 30 minutes spent on this discharge summary- greater than 50% of time spent coordinating a plan of care for numerous diagnoses

## 2018-01-19 DIAGNOSIS — E119 Type 2 diabetes mellitus without complications: Secondary | ICD-10-CM | POA: Diagnosis not present

## 2018-01-19 DIAGNOSIS — E785 Hyperlipidemia, unspecified: Secondary | ICD-10-CM | POA: Diagnosis not present

## 2018-01-19 DIAGNOSIS — R262 Difficulty in walking, not elsewhere classified: Secondary | ICD-10-CM | POA: Diagnosis not present

## 2018-01-19 DIAGNOSIS — I10 Essential (primary) hypertension: Secondary | ICD-10-CM | POA: Diagnosis not present

## 2018-01-19 DIAGNOSIS — M1990 Unspecified osteoarthritis, unspecified site: Secondary | ICD-10-CM | POA: Diagnosis not present

## 2018-01-19 DIAGNOSIS — R627 Adult failure to thrive: Secondary | ICD-10-CM | POA: Diagnosis not present

## 2018-01-19 DIAGNOSIS — R319 Hematuria, unspecified: Secondary | ICD-10-CM | POA: Diagnosis not present

## 2018-01-19 DIAGNOSIS — M6281 Muscle weakness (generalized): Secondary | ICD-10-CM | POA: Diagnosis not present

## 2018-01-19 DIAGNOSIS — N400 Enlarged prostate without lower urinary tract symptoms: Secondary | ICD-10-CM | POA: Diagnosis not present

## 2018-01-19 DIAGNOSIS — M5116 Intervertebral disc disorders with radiculopathy, lumbar region: Secondary | ICD-10-CM | POA: Diagnosis not present

## 2018-01-22 DIAGNOSIS — M6281 Muscle weakness (generalized): Secondary | ICD-10-CM | POA: Diagnosis not present

## 2018-01-22 DIAGNOSIS — Z933 Colostomy status: Secondary | ICD-10-CM | POA: Diagnosis not present

## 2018-01-22 DIAGNOSIS — C20 Malignant neoplasm of rectum: Secondary | ICD-10-CM | POA: Diagnosis not present

## 2018-01-22 DIAGNOSIS — B957 Other staphylococcus as the cause of diseases classified elsewhere: Secondary | ICD-10-CM | POA: Diagnosis not present

## 2018-01-22 DIAGNOSIS — N400 Enlarged prostate without lower urinary tract symptoms: Secondary | ICD-10-CM | POA: Diagnosis not present

## 2018-01-22 DIAGNOSIS — M5116 Intervertebral disc disorders with radiculopathy, lumbar region: Secondary | ICD-10-CM | POA: Diagnosis not present

## 2018-01-22 DIAGNOSIS — I10 Essential (primary) hypertension: Secondary | ICD-10-CM | POA: Diagnosis not present

## 2018-01-22 DIAGNOSIS — Z85040 Personal history of malignant carcinoid tumor of rectum: Secondary | ICD-10-CM | POA: Diagnosis not present

## 2018-01-22 DIAGNOSIS — N390 Urinary tract infection, site not specified: Secondary | ICD-10-CM | POA: Diagnosis not present

## 2018-01-22 DIAGNOSIS — M1990 Unspecified osteoarthritis, unspecified site: Secondary | ICD-10-CM | POA: Diagnosis not present

## 2018-01-22 DIAGNOSIS — M545 Low back pain: Secondary | ICD-10-CM | POA: Diagnosis not present

## 2018-01-22 DIAGNOSIS — E119 Type 2 diabetes mellitus without complications: Secondary | ICD-10-CM | POA: Diagnosis not present

## 2018-01-26 DIAGNOSIS — M1990 Unspecified osteoarthritis, unspecified site: Secondary | ICD-10-CM | POA: Diagnosis not present

## 2018-01-26 DIAGNOSIS — E119 Type 2 diabetes mellitus without complications: Secondary | ICD-10-CM | POA: Diagnosis not present

## 2018-01-26 DIAGNOSIS — M545 Low back pain: Secondary | ICD-10-CM | POA: Diagnosis not present

## 2018-01-26 DIAGNOSIS — M6281 Muscle weakness (generalized): Secondary | ICD-10-CM | POA: Diagnosis not present

## 2018-01-26 DIAGNOSIS — N390 Urinary tract infection, site not specified: Secondary | ICD-10-CM | POA: Diagnosis not present

## 2018-01-26 DIAGNOSIS — I10 Essential (primary) hypertension: Secondary | ICD-10-CM | POA: Diagnosis not present

## 2018-01-26 DIAGNOSIS — Z85040 Personal history of malignant carcinoid tumor of rectum: Secondary | ICD-10-CM | POA: Diagnosis not present

## 2018-01-26 DIAGNOSIS — M5116 Intervertebral disc disorders with radiculopathy, lumbar region: Secondary | ICD-10-CM | POA: Diagnosis not present

## 2018-01-26 DIAGNOSIS — B957 Other staphylococcus as the cause of diseases classified elsewhere: Secondary | ICD-10-CM | POA: Diagnosis not present

## 2018-01-26 DIAGNOSIS — N400 Enlarged prostate without lower urinary tract symptoms: Secondary | ICD-10-CM | POA: Diagnosis not present

## 2018-01-27 DIAGNOSIS — M1990 Unspecified osteoarthritis, unspecified site: Secondary | ICD-10-CM | POA: Diagnosis not present

## 2018-01-27 DIAGNOSIS — B957 Other staphylococcus as the cause of diseases classified elsewhere: Secondary | ICD-10-CM | POA: Diagnosis not present

## 2018-01-27 DIAGNOSIS — N390 Urinary tract infection, site not specified: Secondary | ICD-10-CM | POA: Diagnosis not present

## 2018-01-27 DIAGNOSIS — M6281 Muscle weakness (generalized): Secondary | ICD-10-CM | POA: Diagnosis not present

## 2018-01-27 DIAGNOSIS — E119 Type 2 diabetes mellitus without complications: Secondary | ICD-10-CM | POA: Diagnosis not present

## 2018-01-27 DIAGNOSIS — I10 Essential (primary) hypertension: Secondary | ICD-10-CM | POA: Diagnosis not present

## 2018-01-27 DIAGNOSIS — N400 Enlarged prostate without lower urinary tract symptoms: Secondary | ICD-10-CM | POA: Diagnosis not present

## 2018-01-27 DIAGNOSIS — M545 Low back pain: Secondary | ICD-10-CM | POA: Diagnosis not present

## 2018-01-27 DIAGNOSIS — Z85040 Personal history of malignant carcinoid tumor of rectum: Secondary | ICD-10-CM | POA: Diagnosis not present

## 2018-01-27 DIAGNOSIS — M5116 Intervertebral disc disorders with radiculopathy, lumbar region: Secondary | ICD-10-CM | POA: Diagnosis not present

## 2018-02-03 DIAGNOSIS — M5116 Intervertebral disc disorders with radiculopathy, lumbar region: Secondary | ICD-10-CM | POA: Diagnosis not present

## 2018-02-03 DIAGNOSIS — B957 Other staphylococcus as the cause of diseases classified elsewhere: Secondary | ICD-10-CM | POA: Diagnosis not present

## 2018-02-03 DIAGNOSIS — E119 Type 2 diabetes mellitus without complications: Secondary | ICD-10-CM | POA: Diagnosis not present

## 2018-02-03 DIAGNOSIS — M6281 Muscle weakness (generalized): Secondary | ICD-10-CM | POA: Diagnosis not present

## 2018-02-03 DIAGNOSIS — M545 Low back pain: Secondary | ICD-10-CM | POA: Diagnosis not present

## 2018-02-03 DIAGNOSIS — M1990 Unspecified osteoarthritis, unspecified site: Secondary | ICD-10-CM | POA: Diagnosis not present

## 2018-02-03 DIAGNOSIS — N390 Urinary tract infection, site not specified: Secondary | ICD-10-CM | POA: Diagnosis not present

## 2018-02-03 DIAGNOSIS — Z85040 Personal history of malignant carcinoid tumor of rectum: Secondary | ICD-10-CM | POA: Diagnosis not present

## 2018-02-03 DIAGNOSIS — N400 Enlarged prostate without lower urinary tract symptoms: Secondary | ICD-10-CM | POA: Diagnosis not present

## 2018-02-03 DIAGNOSIS — I10 Essential (primary) hypertension: Secondary | ICD-10-CM | POA: Diagnosis not present

## 2018-02-04 DIAGNOSIS — E782 Mixed hyperlipidemia: Secondary | ICD-10-CM | POA: Diagnosis not present

## 2018-02-04 DIAGNOSIS — D519 Vitamin B12 deficiency anemia, unspecified: Secondary | ICD-10-CM | POA: Diagnosis not present

## 2018-02-04 DIAGNOSIS — E119 Type 2 diabetes mellitus without complications: Secondary | ICD-10-CM | POA: Diagnosis not present

## 2018-02-04 DIAGNOSIS — D509 Iron deficiency anemia, unspecified: Secondary | ICD-10-CM | POA: Diagnosis not present

## 2018-02-04 DIAGNOSIS — E875 Hyperkalemia: Secondary | ICD-10-CM | POA: Diagnosis not present

## 2018-02-04 DIAGNOSIS — R339 Retention of urine, unspecified: Secondary | ICD-10-CM | POA: Diagnosis not present

## 2018-02-04 DIAGNOSIS — Z6824 Body mass index (BMI) 24.0-24.9, adult: Secondary | ICD-10-CM | POA: Diagnosis not present

## 2018-02-04 DIAGNOSIS — T83098A Other mechanical complication of other indwelling urethral catheter, initial encounter: Secondary | ICD-10-CM | POA: Diagnosis not present

## 2018-02-04 DIAGNOSIS — I10 Essential (primary) hypertension: Secondary | ICD-10-CM | POA: Diagnosis not present

## 2018-02-04 DIAGNOSIS — G9009 Other idiopathic peripheral autonomic neuropathy: Secondary | ICD-10-CM | POA: Diagnosis not present

## 2018-02-05 DIAGNOSIS — E875 Hyperkalemia: Secondary | ICD-10-CM | POA: Diagnosis not present

## 2018-02-05 DIAGNOSIS — I10 Essential (primary) hypertension: Secondary | ICD-10-CM | POA: Diagnosis not present

## 2018-02-05 DIAGNOSIS — E119 Type 2 diabetes mellitus without complications: Secondary | ICD-10-CM | POA: Diagnosis not present

## 2018-02-05 DIAGNOSIS — D509 Iron deficiency anemia, unspecified: Secondary | ICD-10-CM | POA: Diagnosis not present

## 2018-02-05 DIAGNOSIS — R339 Retention of urine, unspecified: Secondary | ICD-10-CM | POA: Diagnosis not present

## 2018-02-05 DIAGNOSIS — D519 Vitamin B12 deficiency anemia, unspecified: Secondary | ICD-10-CM | POA: Diagnosis not present

## 2018-02-05 DIAGNOSIS — R5383 Other fatigue: Secondary | ICD-10-CM | POA: Diagnosis not present

## 2018-02-05 DIAGNOSIS — Z6824 Body mass index (BMI) 24.0-24.9, adult: Secondary | ICD-10-CM | POA: Diagnosis not present

## 2018-02-05 DIAGNOSIS — E782 Mixed hyperlipidemia: Secondary | ICD-10-CM | POA: Diagnosis not present

## 2018-02-05 DIAGNOSIS — R531 Weakness: Secondary | ICD-10-CM | POA: Diagnosis not present

## 2018-02-08 DIAGNOSIS — M545 Low back pain: Secondary | ICD-10-CM | POA: Diagnosis not present

## 2018-02-08 DIAGNOSIS — B957 Other staphylococcus as the cause of diseases classified elsewhere: Secondary | ICD-10-CM | POA: Diagnosis not present

## 2018-02-08 DIAGNOSIS — N400 Enlarged prostate without lower urinary tract symptoms: Secondary | ICD-10-CM | POA: Diagnosis not present

## 2018-02-08 DIAGNOSIS — M6281 Muscle weakness (generalized): Secondary | ICD-10-CM | POA: Diagnosis not present

## 2018-02-08 DIAGNOSIS — Z85040 Personal history of malignant carcinoid tumor of rectum: Secondary | ICD-10-CM | POA: Diagnosis not present

## 2018-02-08 DIAGNOSIS — M5116 Intervertebral disc disorders with radiculopathy, lumbar region: Secondary | ICD-10-CM | POA: Diagnosis not present

## 2018-02-08 DIAGNOSIS — I10 Essential (primary) hypertension: Secondary | ICD-10-CM | POA: Diagnosis not present

## 2018-02-08 DIAGNOSIS — N390 Urinary tract infection, site not specified: Secondary | ICD-10-CM | POA: Diagnosis not present

## 2018-02-08 DIAGNOSIS — E119 Type 2 diabetes mellitus without complications: Secondary | ICD-10-CM | POA: Diagnosis not present

## 2018-02-08 DIAGNOSIS — M1990 Unspecified osteoarthritis, unspecified site: Secondary | ICD-10-CM | POA: Diagnosis not present

## 2018-02-09 DIAGNOSIS — N390 Urinary tract infection, site not specified: Secondary | ICD-10-CM | POA: Diagnosis not present

## 2018-02-09 DIAGNOSIS — E119 Type 2 diabetes mellitus without complications: Secondary | ICD-10-CM | POA: Diagnosis not present

## 2018-02-09 DIAGNOSIS — M1990 Unspecified osteoarthritis, unspecified site: Secondary | ICD-10-CM | POA: Diagnosis not present

## 2018-02-09 DIAGNOSIS — Z85040 Personal history of malignant carcinoid tumor of rectum: Secondary | ICD-10-CM | POA: Diagnosis not present

## 2018-02-09 DIAGNOSIS — M5116 Intervertebral disc disorders with radiculopathy, lumbar region: Secondary | ICD-10-CM | POA: Diagnosis not present

## 2018-02-09 DIAGNOSIS — M545 Low back pain: Secondary | ICD-10-CM | POA: Diagnosis not present

## 2018-02-09 DIAGNOSIS — M6281 Muscle weakness (generalized): Secondary | ICD-10-CM | POA: Diagnosis not present

## 2018-02-09 DIAGNOSIS — N400 Enlarged prostate without lower urinary tract symptoms: Secondary | ICD-10-CM | POA: Diagnosis not present

## 2018-02-09 DIAGNOSIS — B957 Other staphylococcus as the cause of diseases classified elsewhere: Secondary | ICD-10-CM | POA: Diagnosis not present

## 2018-02-09 DIAGNOSIS — I10 Essential (primary) hypertension: Secondary | ICD-10-CM | POA: Diagnosis not present

## 2018-02-10 DIAGNOSIS — D519 Vitamin B12 deficiency anemia, unspecified: Secondary | ICD-10-CM | POA: Diagnosis not present

## 2018-02-10 DIAGNOSIS — M1990 Unspecified osteoarthritis, unspecified site: Secondary | ICD-10-CM | POA: Diagnosis not present

## 2018-02-10 DIAGNOSIS — E1165 Type 2 diabetes mellitus with hyperglycemia: Secondary | ICD-10-CM | POA: Diagnosis not present

## 2018-02-10 DIAGNOSIS — R5383 Other fatigue: Secondary | ICD-10-CM | POA: Diagnosis not present

## 2018-02-10 DIAGNOSIS — R531 Weakness: Secondary | ICD-10-CM | POA: Diagnosis not present

## 2018-02-10 DIAGNOSIS — E782 Mixed hyperlipidemia: Secondary | ICD-10-CM | POA: Diagnosis not present

## 2018-02-10 DIAGNOSIS — R339 Retention of urine, unspecified: Secondary | ICD-10-CM | POA: Diagnosis not present

## 2018-02-10 DIAGNOSIS — R000 Tachycardia, unspecified: Secondary | ICD-10-CM | POA: Diagnosis not present

## 2018-02-10 DIAGNOSIS — Z712 Person consulting for explanation of examination or test findings: Secondary | ICD-10-CM | POA: Diagnosis not present

## 2018-02-10 DIAGNOSIS — D509 Iron deficiency anemia, unspecified: Secondary | ICD-10-CM | POA: Diagnosis not present

## 2018-02-13 DIAGNOSIS — R69 Illness, unspecified: Secondary | ICD-10-CM | POA: Diagnosis not present

## 2018-02-16 DIAGNOSIS — Z85040 Personal history of malignant carcinoid tumor of rectum: Secondary | ICD-10-CM | POA: Diagnosis not present

## 2018-02-16 DIAGNOSIS — M1990 Unspecified osteoarthritis, unspecified site: Secondary | ICD-10-CM | POA: Diagnosis not present

## 2018-02-16 DIAGNOSIS — E119 Type 2 diabetes mellitus without complications: Secondary | ICD-10-CM | POA: Diagnosis not present

## 2018-02-16 DIAGNOSIS — M5116 Intervertebral disc disorders with radiculopathy, lumbar region: Secondary | ICD-10-CM | POA: Diagnosis not present

## 2018-02-16 DIAGNOSIS — M545 Low back pain: Secondary | ICD-10-CM | POA: Diagnosis not present

## 2018-02-16 DIAGNOSIS — I10 Essential (primary) hypertension: Secondary | ICD-10-CM | POA: Diagnosis not present

## 2018-02-16 DIAGNOSIS — N400 Enlarged prostate without lower urinary tract symptoms: Secondary | ICD-10-CM | POA: Diagnosis not present

## 2018-02-16 DIAGNOSIS — M6281 Muscle weakness (generalized): Secondary | ICD-10-CM | POA: Diagnosis not present

## 2018-02-16 DIAGNOSIS — B957 Other staphylococcus as the cause of diseases classified elsewhere: Secondary | ICD-10-CM | POA: Diagnosis not present

## 2018-02-16 DIAGNOSIS — N390 Urinary tract infection, site not specified: Secondary | ICD-10-CM | POA: Diagnosis not present

## 2018-02-17 DIAGNOSIS — I10 Essential (primary) hypertension: Secondary | ICD-10-CM | POA: Diagnosis not present

## 2018-02-17 DIAGNOSIS — N400 Enlarged prostate without lower urinary tract symptoms: Secondary | ICD-10-CM | POA: Diagnosis not present

## 2018-02-17 DIAGNOSIS — E119 Type 2 diabetes mellitus without complications: Secondary | ICD-10-CM | POA: Diagnosis not present

## 2018-02-17 DIAGNOSIS — Z85040 Personal history of malignant carcinoid tumor of rectum: Secondary | ICD-10-CM | POA: Diagnosis not present

## 2018-02-17 DIAGNOSIS — M6281 Muscle weakness (generalized): Secondary | ICD-10-CM | POA: Diagnosis not present

## 2018-02-17 DIAGNOSIS — M5116 Intervertebral disc disorders with radiculopathy, lumbar region: Secondary | ICD-10-CM | POA: Diagnosis not present

## 2018-02-17 DIAGNOSIS — M1990 Unspecified osteoarthritis, unspecified site: Secondary | ICD-10-CM | POA: Diagnosis not present

## 2018-02-17 DIAGNOSIS — N390 Urinary tract infection, site not specified: Secondary | ICD-10-CM | POA: Diagnosis not present

## 2018-02-17 DIAGNOSIS — M545 Low back pain: Secondary | ICD-10-CM | POA: Diagnosis not present

## 2018-02-17 DIAGNOSIS — B957 Other staphylococcus as the cause of diseases classified elsewhere: Secondary | ICD-10-CM | POA: Diagnosis not present

## 2018-03-02 DIAGNOSIS — C20 Malignant neoplasm of rectum: Secondary | ICD-10-CM | POA: Diagnosis not present

## 2018-03-02 DIAGNOSIS — Z933 Colostomy status: Secondary | ICD-10-CM | POA: Diagnosis not present

## 2018-04-05 DIAGNOSIS — R918 Other nonspecific abnormal finding of lung field: Secondary | ICD-10-CM | POA: Diagnosis not present

## 2018-04-05 DIAGNOSIS — Z85828 Personal history of other malignant neoplasm of skin: Secondary | ICD-10-CM | POA: Diagnosis not present

## 2018-04-05 DIAGNOSIS — R262 Difficulty in walking, not elsewhere classified: Secondary | ICD-10-CM | POA: Diagnosis not present

## 2018-04-05 DIAGNOSIS — Z882 Allergy status to sulfonamides status: Secondary | ICD-10-CM | POA: Diagnosis not present

## 2018-04-05 DIAGNOSIS — Z7984 Long term (current) use of oral hypoglycemic drugs: Secondary | ICD-10-CM | POA: Diagnosis not present

## 2018-04-05 DIAGNOSIS — Z85038 Personal history of other malignant neoplasm of large intestine: Secondary | ICD-10-CM | POA: Diagnosis not present

## 2018-04-05 DIAGNOSIS — R531 Weakness: Secondary | ICD-10-CM | POA: Diagnosis not present

## 2018-04-05 DIAGNOSIS — R69 Illness, unspecified: Secondary | ICD-10-CM | POA: Diagnosis not present

## 2018-04-05 DIAGNOSIS — R079 Chest pain, unspecified: Secondary | ICD-10-CM | POA: Diagnosis not present

## 2018-04-05 DIAGNOSIS — Z79899 Other long term (current) drug therapy: Secondary | ICD-10-CM | POA: Diagnosis not present

## 2018-04-05 DIAGNOSIS — E860 Dehydration: Secondary | ICD-10-CM | POA: Diagnosis not present

## 2018-04-05 DIAGNOSIS — I10 Essential (primary) hypertension: Secondary | ICD-10-CM | POA: Diagnosis not present

## 2018-04-05 DIAGNOSIS — R030 Elevated blood-pressure reading, without diagnosis of hypertension: Secondary | ICD-10-CM | POA: Diagnosis not present

## 2018-04-05 DIAGNOSIS — E119 Type 2 diabetes mellitus without complications: Secondary | ICD-10-CM | POA: Diagnosis not present

## 2018-04-06 DIAGNOSIS — R531 Weakness: Secondary | ICD-10-CM | POA: Diagnosis not present

## 2018-04-10 DIAGNOSIS — Z85828 Personal history of other malignant neoplasm of skin: Secondary | ICD-10-CM | POA: Diagnosis not present

## 2018-04-10 DIAGNOSIS — Z433 Encounter for attention to colostomy: Secondary | ICD-10-CM | POA: Diagnosis not present

## 2018-04-10 DIAGNOSIS — Z79891 Long term (current) use of opiate analgesic: Secondary | ICD-10-CM | POA: Diagnosis not present

## 2018-04-10 DIAGNOSIS — E119 Type 2 diabetes mellitus without complications: Secondary | ICD-10-CM | POA: Diagnosis not present

## 2018-04-10 DIAGNOSIS — Z85118 Personal history of other malignant neoplasm of bronchus and lung: Secondary | ICD-10-CM | POA: Diagnosis not present

## 2018-04-10 DIAGNOSIS — I10 Essential (primary) hypertension: Secondary | ICD-10-CM | POA: Diagnosis not present

## 2018-04-10 DIAGNOSIS — M6281 Muscle weakness (generalized): Secondary | ICD-10-CM | POA: Diagnosis not present

## 2018-04-10 DIAGNOSIS — Z7984 Long term (current) use of oral hypoglycemic drugs: Secondary | ICD-10-CM | POA: Diagnosis not present

## 2018-04-14 DIAGNOSIS — Z433 Encounter for attention to colostomy: Secondary | ICD-10-CM | POA: Diagnosis not present

## 2018-04-14 DIAGNOSIS — I10 Essential (primary) hypertension: Secondary | ICD-10-CM | POA: Diagnosis not present

## 2018-04-14 DIAGNOSIS — M6281 Muscle weakness (generalized): Secondary | ICD-10-CM | POA: Diagnosis not present

## 2018-04-14 DIAGNOSIS — Z7984 Long term (current) use of oral hypoglycemic drugs: Secondary | ICD-10-CM | POA: Diagnosis not present

## 2018-04-14 DIAGNOSIS — E119 Type 2 diabetes mellitus without complications: Secondary | ICD-10-CM | POA: Diagnosis not present

## 2018-04-14 DIAGNOSIS — Z85118 Personal history of other malignant neoplasm of bronchus and lung: Secondary | ICD-10-CM | POA: Diagnosis not present

## 2018-04-14 DIAGNOSIS — Z79891 Long term (current) use of opiate analgesic: Secondary | ICD-10-CM | POA: Diagnosis not present

## 2018-04-14 DIAGNOSIS — Z85828 Personal history of other malignant neoplasm of skin: Secondary | ICD-10-CM | POA: Diagnosis not present

## 2018-04-30 ENCOUNTER — Ambulatory Visit: Payer: Medicare HMO | Admitting: Cardiology

## 2018-04-30 ENCOUNTER — Encounter

## 2018-05-06 DIAGNOSIS — R339 Retention of urine, unspecified: Secondary | ICD-10-CM | POA: Diagnosis not present

## 2018-05-06 DIAGNOSIS — E782 Mixed hyperlipidemia: Secondary | ICD-10-CM | POA: Diagnosis not present

## 2018-05-06 DIAGNOSIS — Z6824 Body mass index (BMI) 24.0-24.9, adult: Secondary | ICD-10-CM | POA: Diagnosis not present

## 2018-05-06 DIAGNOSIS — E875 Hyperkalemia: Secondary | ICD-10-CM | POA: Diagnosis not present

## 2018-05-06 DIAGNOSIS — D509 Iron deficiency anemia, unspecified: Secondary | ICD-10-CM | POA: Diagnosis not present

## 2018-05-06 DIAGNOSIS — D519 Vitamin B12 deficiency anemia, unspecified: Secondary | ICD-10-CM | POA: Diagnosis not present

## 2018-05-06 DIAGNOSIS — T83098A Other mechanical complication of other indwelling urethral catheter, initial encounter: Secondary | ICD-10-CM | POA: Diagnosis not present

## 2018-05-06 DIAGNOSIS — G9009 Other idiopathic peripheral autonomic neuropathy: Secondary | ICD-10-CM | POA: Diagnosis not present

## 2018-05-06 DIAGNOSIS — E119 Type 2 diabetes mellitus without complications: Secondary | ICD-10-CM | POA: Diagnosis not present

## 2018-05-06 DIAGNOSIS — I10 Essential (primary) hypertension: Secondary | ICD-10-CM | POA: Diagnosis not present

## 2018-05-13 DIAGNOSIS — M7918 Myalgia, other site: Secondary | ICD-10-CM | POA: Diagnosis not present

## 2018-05-13 DIAGNOSIS — M1990 Unspecified osteoarthritis, unspecified site: Secondary | ICD-10-CM | POA: Diagnosis not present

## 2018-05-13 DIAGNOSIS — E1169 Type 2 diabetes mellitus with other specified complication: Secondary | ICD-10-CM | POA: Diagnosis not present

## 2018-05-13 DIAGNOSIS — D519 Vitamin B12 deficiency anemia, unspecified: Secondary | ICD-10-CM | POA: Diagnosis not present

## 2018-05-13 DIAGNOSIS — D509 Iron deficiency anemia, unspecified: Secondary | ICD-10-CM | POA: Diagnosis not present

## 2018-05-13 DIAGNOSIS — R531 Weakness: Secondary | ICD-10-CM | POA: Diagnosis not present

## 2018-05-13 DIAGNOSIS — L989 Disorder of the skin and subcutaneous tissue, unspecified: Secondary | ICD-10-CM | POA: Diagnosis not present

## 2018-05-13 DIAGNOSIS — E782 Mixed hyperlipidemia: Secondary | ICD-10-CM | POA: Diagnosis not present

## 2018-05-13 DIAGNOSIS — E119 Type 2 diabetes mellitus without complications: Secondary | ICD-10-CM | POA: Diagnosis not present

## 2018-05-13 DIAGNOSIS — I10 Essential (primary) hypertension: Secondary | ICD-10-CM | POA: Diagnosis not present

## 2018-05-26 DIAGNOSIS — C44311 Basal cell carcinoma of skin of nose: Secondary | ICD-10-CM | POA: Diagnosis not present

## 2018-05-26 DIAGNOSIS — X32XXXA Exposure to sunlight, initial encounter: Secondary | ICD-10-CM | POA: Diagnosis not present

## 2018-05-26 DIAGNOSIS — L570 Actinic keratosis: Secondary | ICD-10-CM | POA: Diagnosis not present

## 2018-05-30 DIAGNOSIS — Z933 Colostomy status: Secondary | ICD-10-CM | POA: Diagnosis not present

## 2018-05-30 DIAGNOSIS — C20 Malignant neoplasm of rectum: Secondary | ICD-10-CM | POA: Diagnosis not present

## 2018-06-02 DIAGNOSIS — E782 Mixed hyperlipidemia: Secondary | ICD-10-CM | POA: Diagnosis not present

## 2018-06-02 DIAGNOSIS — I10 Essential (primary) hypertension: Secondary | ICD-10-CM | POA: Diagnosis not present

## 2018-06-02 DIAGNOSIS — E119 Type 2 diabetes mellitus without complications: Secondary | ICD-10-CM | POA: Diagnosis not present

## 2018-06-22 DIAGNOSIS — E119 Type 2 diabetes mellitus without complications: Secondary | ICD-10-CM | POA: Diagnosis not present

## 2018-06-22 DIAGNOSIS — M160 Bilateral primary osteoarthritis of hip: Secondary | ICD-10-CM | POA: Diagnosis not present

## 2018-06-22 DIAGNOSIS — I10 Essential (primary) hypertension: Secondary | ICD-10-CM | POA: Diagnosis not present

## 2018-06-22 DIAGNOSIS — R69 Illness, unspecified: Secondary | ICD-10-CM | POA: Diagnosis not present

## 2018-06-22 DIAGNOSIS — Z23 Encounter for immunization: Secondary | ICD-10-CM | POA: Diagnosis not present

## 2018-06-22 DIAGNOSIS — E782 Mixed hyperlipidemia: Secondary | ICD-10-CM | POA: Diagnosis not present

## 2018-06-22 DIAGNOSIS — C44301 Unspecified malignant neoplasm of skin of nose: Secondary | ICD-10-CM | POA: Diagnosis not present

## 2018-07-26 DIAGNOSIS — C20 Malignant neoplasm of rectum: Secondary | ICD-10-CM | POA: Diagnosis not present

## 2018-07-26 DIAGNOSIS — Z933 Colostomy status: Secondary | ICD-10-CM | POA: Diagnosis not present

## 2018-07-30 DIAGNOSIS — N390 Urinary tract infection, site not specified: Secondary | ICD-10-CM | POA: Diagnosis not present

## 2018-08-12 DIAGNOSIS — M545 Low back pain: Secondary | ICD-10-CM | POA: Diagnosis not present

## 2018-08-12 DIAGNOSIS — N400 Enlarged prostate without lower urinary tract symptoms: Secondary | ICD-10-CM | POA: Diagnosis not present

## 2018-08-12 DIAGNOSIS — M1990 Unspecified osteoarthritis, unspecified site: Secondary | ICD-10-CM | POA: Diagnosis not present

## 2018-08-12 DIAGNOSIS — Z933 Colostomy status: Secondary | ICD-10-CM | POA: Diagnosis not present

## 2018-08-12 DIAGNOSIS — Z596 Low income: Secondary | ICD-10-CM | POA: Diagnosis not present

## 2018-08-12 DIAGNOSIS — G8929 Other chronic pain: Secondary | ICD-10-CM | POA: Diagnosis not present

## 2018-08-12 DIAGNOSIS — Z7984 Long term (current) use of oral hypoglycemic drugs: Secondary | ICD-10-CM | POA: Diagnosis not present

## 2018-08-12 DIAGNOSIS — E119 Type 2 diabetes mellitus without complications: Secondary | ICD-10-CM | POA: Diagnosis not present

## 2018-08-12 DIAGNOSIS — N529 Male erectile dysfunction, unspecified: Secondary | ICD-10-CM | POA: Diagnosis not present

## 2018-08-12 DIAGNOSIS — I10 Essential (primary) hypertension: Secondary | ICD-10-CM | POA: Diagnosis not present

## 2018-08-30 ENCOUNTER — Emergency Department (HOSPITAL_COMMUNITY): Payer: Medicare HMO

## 2018-08-30 ENCOUNTER — Inpatient Hospital Stay (HOSPITAL_COMMUNITY): Payer: Medicare HMO

## 2018-08-30 ENCOUNTER — Encounter (HOSPITAL_COMMUNITY): Payer: Self-pay

## 2018-08-30 ENCOUNTER — Other Ambulatory Visit: Payer: Self-pay

## 2018-08-30 ENCOUNTER — Inpatient Hospital Stay (HOSPITAL_COMMUNITY)
Admission: EM | Admit: 2018-08-30 | Discharge: 2018-09-06 | DRG: 480 | Disposition: A | Discharge status: Skilled Nursing Facility | Payer: Medicare HMO | Source: Other Acute Inpatient Hospital | Attending: Student | Admitting: Student

## 2018-08-30 DIAGNOSIS — S72401D Unspecified fracture of lower end of right femur, subsequent encounter for closed fracture with routine healing: Secondary | ICD-10-CM | POA: Diagnosis not present

## 2018-08-30 DIAGNOSIS — S72451A Displaced supracondylar fracture without intracondylar extension of lower end of right femur, initial encounter for closed fracture: Principal | ICD-10-CM | POA: Diagnosis present

## 2018-08-30 DIAGNOSIS — S72009A Fracture of unspecified part of neck of unspecified femur, initial encounter for closed fracture: Secondary | ICD-10-CM

## 2018-08-30 DIAGNOSIS — S72142S Displaced intertrochanteric fracture of left femur, sequela: Secondary | ICD-10-CM | POA: Diagnosis present

## 2018-08-30 DIAGNOSIS — S72401A Unspecified fracture of lower end of right femur, initial encounter for closed fracture: Secondary | ICD-10-CM | POA: Insufficient documentation

## 2018-08-30 DIAGNOSIS — M1612 Unilateral primary osteoarthritis, left hip: Secondary | ICD-10-CM | POA: Diagnosis not present

## 2018-08-30 DIAGNOSIS — N179 Acute kidney failure, unspecified: Secondary | ICD-10-CM | POA: Diagnosis not present

## 2018-08-30 DIAGNOSIS — Y92009 Unspecified place in unspecified non-institutional (private) residence as the place of occurrence of the external cause: Secondary | ICD-10-CM

## 2018-08-30 DIAGNOSIS — I10 Essential (primary) hypertension: Secondary | ICD-10-CM | POA: Diagnosis not present

## 2018-08-30 DIAGNOSIS — E785 Hyperlipidemia, unspecified: Secondary | ICD-10-CM | POA: Diagnosis present

## 2018-08-30 DIAGNOSIS — D62 Acute posthemorrhagic anemia: Secondary | ICD-10-CM | POA: Diagnosis not present

## 2018-08-30 DIAGNOSIS — S72142A Displaced intertrochanteric fracture of left femur, initial encounter for closed fracture: Secondary | ICD-10-CM | POA: Diagnosis not present

## 2018-08-30 DIAGNOSIS — R571 Hypovolemic shock: Secondary | ICD-10-CM | POA: Diagnosis present

## 2018-08-30 DIAGNOSIS — S72002A Fracture of unspecified part of neck of left femur, initial encounter for closed fracture: Secondary | ICD-10-CM

## 2018-08-30 DIAGNOSIS — S728X2A Other fracture of left femur, initial encounter for closed fracture: Secondary | ICD-10-CM | POA: Diagnosis not present

## 2018-08-30 DIAGNOSIS — K435 Parastomal hernia without obstruction or  gangrene: Secondary | ICD-10-CM | POA: Diagnosis present

## 2018-08-30 DIAGNOSIS — R42 Dizziness and giddiness: Secondary | ICD-10-CM | POA: Diagnosis not present

## 2018-08-30 DIAGNOSIS — M898X9 Other specified disorders of bone, unspecified site: Secondary | ICD-10-CM | POA: Diagnosis present

## 2018-08-30 DIAGNOSIS — M810 Age-related osteoporosis without current pathological fracture: Secondary | ICD-10-CM | POA: Diagnosis present

## 2018-08-30 DIAGNOSIS — W19XXXA Unspecified fall, initial encounter: Secondary | ICD-10-CM

## 2018-08-30 DIAGNOSIS — Z933 Colostomy status: Secondary | ICD-10-CM

## 2018-08-30 DIAGNOSIS — Y92013 Bedroom of single-family (private) house as the place of occurrence of the external cause: Secondary | ICD-10-CM | POA: Diagnosis not present

## 2018-08-30 DIAGNOSIS — R52 Pain, unspecified: Secondary | ICD-10-CM

## 2018-08-30 DIAGNOSIS — S72491A Other fracture of lower end of right femur, initial encounter for closed fracture: Secondary | ICD-10-CM | POA: Diagnosis not present

## 2018-08-30 DIAGNOSIS — S299XXA Unspecified injury of thorax, initial encounter: Secondary | ICD-10-CM | POA: Diagnosis not present

## 2018-08-30 DIAGNOSIS — S0990XA Unspecified injury of head, initial encounter: Secondary | ICD-10-CM | POA: Diagnosis not present

## 2018-08-30 DIAGNOSIS — S7222XA Displaced subtrochanteric fracture of left femur, initial encounter for closed fracture: Secondary | ICD-10-CM | POA: Diagnosis not present

## 2018-08-30 DIAGNOSIS — C801 Malignant (primary) neoplasm, unspecified: Secondary | ICD-10-CM | POA: Diagnosis not present

## 2018-08-30 DIAGNOSIS — M25572 Pain in left ankle and joints of left foot: Secondary | ICD-10-CM | POA: Diagnosis not present

## 2018-08-30 DIAGNOSIS — S72341A Displaced spiral fracture of shaft of right femur, initial encounter for closed fracture: Secondary | ICD-10-CM | POA: Diagnosis not present

## 2018-08-30 DIAGNOSIS — Z4789 Encounter for other orthopedic aftercare: Secondary | ICD-10-CM | POA: Diagnosis not present

## 2018-08-30 DIAGNOSIS — N400 Enlarged prostate without lower urinary tract symptoms: Secondary | ICD-10-CM | POA: Diagnosis not present

## 2018-08-30 DIAGNOSIS — M549 Dorsalgia, unspecified: Secondary | ICD-10-CM | POA: Diagnosis present

## 2018-08-30 DIAGNOSIS — R000 Tachycardia, unspecified: Secondary | ICD-10-CM | POA: Diagnosis not present

## 2018-08-30 DIAGNOSIS — S728X1A Other fracture of right femur, initial encounter for closed fracture: Secondary | ICD-10-CM | POA: Diagnosis not present

## 2018-08-30 DIAGNOSIS — S82831A Other fracture of upper and lower end of right fibula, initial encounter for closed fracture: Secondary | ICD-10-CM | POA: Diagnosis not present

## 2018-08-30 DIAGNOSIS — S72012A Unspecified intracapsular fracture of left femur, initial encounter for closed fracture: Secondary | ICD-10-CM | POA: Diagnosis not present

## 2018-08-30 DIAGNOSIS — M1990 Unspecified osteoarthritis, unspecified site: Secondary | ICD-10-CM | POA: Diagnosis not present

## 2018-08-30 DIAGNOSIS — Z85048 Personal history of other malignant neoplasm of rectum, rectosigmoid junction, and anus: Secondary | ICD-10-CM | POA: Diagnosis not present

## 2018-08-30 DIAGNOSIS — T1490XA Injury, unspecified, initial encounter: Secondary | ICD-10-CM

## 2018-08-30 DIAGNOSIS — Z882 Allergy status to sulfonamides status: Secondary | ICD-10-CM | POA: Diagnosis not present

## 2018-08-30 DIAGNOSIS — W1830XA Fall on same level, unspecified, initial encounter: Secondary | ICD-10-CM | POA: Diagnosis not present

## 2018-08-30 DIAGNOSIS — S72491D Other fracture of lower end of right femur, subsequent encounter for closed fracture with routine healing: Secondary | ICD-10-CM | POA: Diagnosis not present

## 2018-08-30 DIAGNOSIS — S3991XA Unspecified injury of abdomen, initial encounter: Secondary | ICD-10-CM | POA: Diagnosis not present

## 2018-08-30 DIAGNOSIS — E119 Type 2 diabetes mellitus without complications: Secondary | ICD-10-CM | POA: Diagnosis present

## 2018-08-30 DIAGNOSIS — Z419 Encounter for procedure for purposes other than remedying health state, unspecified: Secondary | ICD-10-CM

## 2018-08-30 DIAGNOSIS — S199XXA Unspecified injury of neck, initial encounter: Secondary | ICD-10-CM | POA: Diagnosis not present

## 2018-08-30 DIAGNOSIS — G8929 Other chronic pain: Secondary | ICD-10-CM | POA: Diagnosis present

## 2018-08-30 DIAGNOSIS — S3992XA Unspecified injury of lower back, initial encounter: Secondary | ICD-10-CM | POA: Diagnosis not present

## 2018-08-30 DIAGNOSIS — S72142D Displaced intertrochanteric fracture of left femur, subsequent encounter for closed fracture with routine healing: Secondary | ICD-10-CM | POA: Diagnosis not present

## 2018-08-30 DIAGNOSIS — M545 Low back pain: Secondary | ICD-10-CM | POA: Diagnosis not present

## 2018-08-30 DIAGNOSIS — M6281 Muscle weakness (generalized): Secondary | ICD-10-CM | POA: Diagnosis not present

## 2018-08-30 DIAGNOSIS — R627 Adult failure to thrive: Secondary | ICD-10-CM | POA: Diagnosis not present

## 2018-08-30 DIAGNOSIS — S72492D Other fracture of lower end of left femur, subsequent encounter for closed fracture with routine healing: Secondary | ICD-10-CM | POA: Diagnosis not present

## 2018-08-30 LAB — TROPONIN I
Troponin I: <0.03 ng/mL (ref <0.03)
Troponin I: <0.03 ng/mL (ref <0.03)

## 2018-08-30 LAB — URINALYSIS, ROUTINE W REFLEX MICROSCOPIC
BILIRUBIN URINE: NEGATIVE
Bacteria, UA: NONE SEEN
Glucose, UA: NEGATIVE mg/dL
Ketones, ur: 20 mg/dL — AB
NITRITE: NEGATIVE
PH: 5 (ref 5.0–8.0)
Protein, ur: NEGATIVE mg/dL
SPECIFIC GRAVITY, URINE: 1.015 (ref 1.005–1.030)

## 2018-08-30 LAB — CBC WITH DIFFERENTIAL/PLATELET
Abs Immature Granulocytes: 0.07 10*3/uL (ref 0.00–0.07)
BASOS ABS: 0 10*3/uL (ref 0.0–0.1)
Basophils Relative: 0 %
EOS ABS: 0.3 10*3/uL (ref 0.0–0.5)
EOS PCT: 4 %
HCT: 35.2 % — ABNORMAL LOW (ref 39.0–52.0)
Hemoglobin: 11.3 g/dL — ABNORMAL LOW (ref 13.0–17.0)
Immature Granulocytes: 1 %
Lymphocytes Relative: 27 %
Lymphs Abs: 2.4 10*3/uL (ref 0.7–4.0)
MCH: 29.7 pg (ref 26.0–34.0)
MCHC: 32.1 g/dL (ref 30.0–36.0)
MCV: 92.6 fL (ref 80.0–100.0)
Monocytes Absolute: 0.5 10*3/uL (ref 0.1–1.0)
Monocytes Relative: 6 %
NRBC: 0 % (ref 0.0–0.2)
Neutro Abs: 5.6 10*3/uL (ref 1.7–7.7)
Neutrophils Relative %: 62 %
Platelets: 169 10*3/uL (ref 150–400)
RBC: 3.8 MIL/uL — ABNORMAL LOW (ref 4.22–5.81)
RDW: 12.7 % (ref 11.5–15.5)
WBC: 9 10*3/uL (ref 4.0–10.5)

## 2018-08-30 LAB — PREPARE RBC (CROSSMATCH)

## 2018-08-30 LAB — CBC
HCT: 33.3 % — ABNORMAL LOW (ref 39.0–52.0)
Hemoglobin: 10.5 g/dL — ABNORMAL LOW (ref 13.0–17.0)
MCH: 29.9 pg (ref 26.0–34.0)
MCHC: 31.5 g/dL (ref 30.0–36.0)
MCV: 94.9 fL (ref 80.0–100.0)
Platelets: 195 10*3/uL (ref 150–400)
RBC: 3.51 MIL/uL — ABNORMAL LOW (ref 4.22–5.81)
RDW: 12.6 % (ref 11.5–15.5)
WBC: 18.6 10*3/uL — ABNORMAL HIGH (ref 4.0–10.5)
nRBC: 0 % (ref 0.0–0.2)

## 2018-08-30 LAB — ABO/RH
ABO/RH(D): A POS
ABO/RH(D): A POS

## 2018-08-30 LAB — GLUCOSE, CAPILLARY: Glucose-Capillary: 250 mg/dL — ABNORMAL HIGH (ref 70–99)

## 2018-08-30 LAB — COMPREHENSIVE METABOLIC PANEL
ALBUMIN: 3.3 g/dL — AB (ref 3.5–5.0)
ALT: 19 U/L (ref 0–44)
ANION GAP: 13 (ref 5–15)
AST: 22 U/L (ref 15–41)
Alkaline Phosphatase: 65 U/L (ref 38–126)
BUN: 14 mg/dL (ref 8–23)
CO2: 21 mmol/L — ABNORMAL LOW (ref 22–32)
Calcium: 8.8 mg/dL — ABNORMAL LOW (ref 8.9–10.3)
Chloride: 103 mmol/L (ref 98–111)
Creatinine, Ser: 0.91 mg/dL (ref 0.61–1.24)
GFR calc Af Amer: >60 mL/min (ref >60)
GFR calc non Af Amer: >60 mL/min (ref >60)
Glucose, Bld: 173 mg/dL — ABNORMAL HIGH (ref 70–99)
Potassium: 4.4 mmol/L (ref 3.5–5.1)
Sodium: 137 mmol/L (ref 135–145)
Total Bilirubin: 0.5 mg/dL (ref 0.3–1.2)
Total Protein: 6.1 g/dL — ABNORMAL LOW (ref 6.5–8.1)

## 2018-08-30 LAB — APTT: aPTT: 30 seconds (ref 24–36)

## 2018-08-30 LAB — LACTIC ACID, PLASMA: Lactic Acid, Venous: 7 mmol/L (ref 0.5–1.9)

## 2018-08-30 LAB — PROTIME-INR
INR: 1.23
PROTHROMBIN TIME: 15.3 s — AB (ref 11.4–15.2)

## 2018-08-30 MED ORDER — TAMSULOSIN HCL 0.4 MG PO CAPS
0.4000 mg | ORAL_CAPSULE | Freq: Every day | ORAL | Status: DC
  Administered 2018-09-02 – 2018-09-06 (×5): 0.4 mg via ORAL
  Filled 2018-08-30 (×5): qty 1

## 2018-08-30 MED ORDER — SODIUM CHLORIDE 0.9% IV SOLUTION
Freq: Once | INTRAVENOUS | Status: AC
  Administered 2018-08-30: 23:00:00 via INTRAVENOUS

## 2018-08-30 MED ORDER — SODIUM CHLORIDE 0.9 % IV BOLUS: 500.0000 mL | Freq: Once | INTRAVENOUS | Status: DC

## 2018-08-30 MED ORDER — SODIUM CHLORIDE 0.9 % IV SOLN: 10.0000 mL/h | Freq: Once | INTRAVENOUS | Status: DC

## 2018-08-30 MED ORDER — HYDROMORPHONE HCL 1 MG/ML IJ SOLN
1.0000 mg | INTRAMUSCULAR | Status: DC | PRN
  Administered 2018-08-30 – 2018-09-01 (×5): 1 mg via INTRAVENOUS
  Filled 2018-08-30 (×6): qty 1

## 2018-08-30 MED ORDER — CEFAZOLIN SODIUM-DEXTROSE 2-4 GM/100ML-% IV SOLN
2.0000 g | INTRAVENOUS | Status: AC
  Administered 2018-09-01: 2 g via INTRAVENOUS
  Filled 2018-08-30: qty 100

## 2018-08-30 MED ORDER — SODIUM CHLORIDE 0.9 % IV BOLUS: 1000.0000 mL | Freq: Once | INTRAVENOUS | Status: DC

## 2018-08-30 MED ORDER — CHLORHEXIDINE GLUCONATE 4 % EX LIQD
60.0000 mL | Freq: Once | CUTANEOUS | Status: AC
  Administered 2018-09-01: 4 via TOPICAL
  Filled 2018-08-30: qty 60

## 2018-08-30 MED ORDER — POVIDONE-IODINE 10 % EX SWAB: 2.0000 "application " | Freq: Once | CUTANEOUS | Status: DC

## 2018-08-30 MED ORDER — FENTANYL CITRATE (PF) 100 MCG/2ML IJ SOLN
25.0000 ug | Freq: Once | INTRAMUSCULAR | Status: AC
  Administered 2018-08-30: 25 ug via INTRAVENOUS
  Filled 2018-08-30: qty 2

## 2018-08-30 MED ORDER — DEXTROSE-NACL 5-0.9 % IV SOLN
INTRAVENOUS | Status: DC
  Administered 2018-08-31 – 2018-09-01 (×3): via INTRAVENOUS

## 2018-08-30 MED ORDER — IOHEXOL 300 MG/ML  SOLN
100.0000 mL | Freq: Once | INTRAMUSCULAR | Status: AC | PRN
  Administered 2018-08-30: 100 mL via INTRAVENOUS

## 2018-08-30 MED ORDER — ONDANSETRON HCL 4 MG/2ML IJ SOLN: 4.0000 mg | Freq: Four times a day (QID) | INTRAMUSCULAR | Status: DC | PRN

## 2018-08-30 MED ORDER — INSULIN ASPART 100 UNIT/ML ~~LOC~~ SOLN
0.0000 [IU] | SUBCUTANEOUS | Status: DC
  Administered 2018-08-30 – 2018-08-31 (×2): 5 [IU] via SUBCUTANEOUS
  Administered 2018-08-31: 3 [IU] via SUBCUTANEOUS
  Administered 2018-08-31: 5 [IU] via SUBCUTANEOUS
  Administered 2018-09-01: 2 [IU] via SUBCUTANEOUS
  Administered 2018-09-01 (×2): 3 [IU] via SUBCUTANEOUS

## 2018-08-30 MED ORDER — MORPHINE SULFATE (PF) 4 MG/ML IV SOLN
4.0000 mg | INTRAVENOUS | Status: DC | PRN
  Administered 2018-08-30: 4 mg via INTRAVENOUS
  Filled 2018-08-30: qty 1

## 2018-08-30 MED ORDER — SODIUM CHLORIDE 0.9 % IV BOLUS
1000.0000 mL | Freq: Once | INTRAVENOUS | Status: AC
  Administered 2018-08-30: 1000 mL via INTRAVENOUS

## 2018-08-30 MED ORDER — CHLORHEXIDINE GLUCONATE 4 % EX LIQD
60.0000 mL | Freq: Once | CUTANEOUS | Status: DC
  Filled 2018-08-30: qty 60

## 2018-08-30 MED ORDER — ONDANSETRON 4 MG PO TBDP: 4.0000 mg | ORAL_TABLET | Freq: Four times a day (QID) | ORAL | Status: DC | PRN

## 2018-08-30 NOTE — H&P (Signed)
Ralph Yang is an 85 y.o. male.   Chief Complaint: [place] in bedroom HPI: [facility] after [facility] in his bedroom.  He complained of bilateral leg pain.  He was worked up there and had bilateral femur fractures.  Orthopedics was consulted but given his advanced age transfer to a trauma center was requested.  Orthopedics agreed to take the patient and the trauma service that as well.  He developed some hypotension while up there.  He was given 1 unit of blood and actually the entire unit was not given due to issues with IV access.  He presented the emergency room with some element of hypotension but was mentating well and seemed clinically well perfused.  The patient stated he was in his bedroom turned and fell down.  He was knocked unconscious.  He has a history of a colostomy from colon cancer.  His work-up revealed bilateral femur fractures but no evidence of intracranial, cervical, thoracic or intra-abdominal injury.  Past Medical History:  Diagnosis Date  . Arthritis   . Cancer ([place])    rectal  . Diabetes mellitus without complication ([place])   . Hypertension     Past Surgical History:  Procedure Laterality Date  . BACK SURGERY    . CHOLECYSTECTOMY    . COLOSTOMY    . COLOSTOMY      No family history on file. Social History:  reports that he has never smoked. He has never used smokeless tobacco. He reports that he does not drink alcohol or use drugs.  Allergies:  Allergies  Allergen Reactions  . Sulfa Antibiotics Nausea Only    (Not in a hospital admission)   Results for orders placed or performed during the hospital encounter of 08/30/18 (from the past 48 hour(s))  Comprehensive metabolic panel     Status: Abnormal   Collection Time: 08/30/18  2:28 PM  Result Value Ref Range   Sodium 137 135 - 145 mmol/L   Potassium 4.4 3.5 - 5.1 mmol/L   Chloride 103 98 - 111 mmol/L   CO2 21 (L) 22 - 32 mmol/L   Glucose, Bld 173 (H) 70 - 99 mg/dL   BUN  14 8 - 23 mg/dL   Creatinine, Ser 0.91 0.61 - 1.24 mg/dL   Calcium 8.8 (L) 8.9 - 10.3 mg/dL   Total Protein 6.1 (L) 6.5 - 8.1 g/dL   Albumin 3.3 (L) 3.5 - 5.0 g/dL   AST 22 15 - 41 U/L   ALT 19 0 - 44 U/L   Alkaline Phosphatase 65 38 - 126 U/L   Total Bilirubin 0.5 0.3 - 1.2 mg/dL   GFR calc non Af Amer >60 >60 mL/min   GFR calc Af Amer >60 >60 mL/min   Anion gap 13 5 - 15    Comment: Performed at [facility], [address], [place], [place] [address]  Troponin I - Once     Status: None   Collection Time: 08/30/18  2:28 PM  Result Value Ref Range   Troponin I <0.03 <0.03 ng/mL    Comment: Performed at [facility], [address], [place], [place] [address]  CBC with Differential     Status: Abnormal   Collection Time: 08/30/18  2:28 PM  Result Value Ref Range   WBC 9.0 4.0 - 10.5 K/uL   RBC 3.80 (L) 4.22 - 5.81 MIL/uL   Hemoglobin 11.3 (L) 13.0 - 17.0 g/dL   HCT 35.2 (L) 39.0 - 52.0 %  MCV 92.6 80.0 - 100.0 fL   MCH 29.7 26.0 - 34.0 pg   MCHC 32.1 30.0 - 36.0 g/dL   RDW 12.7 11.5 - 15.5 %   Platelets 169 150 - 400 K/uL   nRBC 0.0 0.0 - 0.2 %   Neutrophils Relative % 62 %   Neutro Abs 5.6 1.7 - 7.7 K/uL   Lymphocytes Relative 27 %   Lymphs Abs 2.4 0.7 - 4.0 K/uL   Monocytes Relative 6 %   Monocytes Absolute 0.5 0.1 - 1.0 K/uL   Eosinophils Relative 4 %   Eosinophils Absolute 0.3 0.0 - 0.5 K/uL   Basophils Relative 0 %   Basophils Absolute 0.0 0.0 - 0.1 K/uL   Immature Granulocytes 1 %   Abs Immature Granulocytes 0.07 0.00 - 0.07 K/uL    Comment: Performed at [facility], [address] [place], [place] [address]  Urinalysis, Routine w reflex microscopic     Status: Abnormal   Collection Time: 08/30/18  4:38 PM  Result Value Ref Range   Color, Urine YELLOW YELLOW   APPearance HAZY (A) CLEAR   Specific Gravity, Urine 1.015 1.005 - 1.030   pH 5.0 5.0 - 8.0   Glucose, UA NEGATIVE NEGATIVE mg/dL   Hgb urine dipstick SMALL (A) NEGATIVE   Bilirubin Urine NEGATIVE  NEGATIVE   Ketones, ur 20 (A) NEGATIVE mg/dL   Protein, ur NEGATIVE NEGATIVE mg/dL   Nitrite NEGATIVE NEGATIVE   Leukocytes, UA SMALL (A) NEGATIVE   RBC / HPF 11-20 0 - 5 RBC/hpf   WBC, UA 21-50 0 - 5 WBC/hpf   Bacteria, UA NONE SEEN NONE SEEN   Squamous Epithelial / LPF 0-5 0 - 5   Mucus PRESENT     Comment: Performed at [facility], [address] [place], [place] [address]  ABO/Rh     Status: None (Preliminary result)   Collection Time: 08/30/18  5:28 PM  Result Value Ref Range   ABO/RH(D)      A POS Performed at [facility], [address], [place], [place] [address]   CBC     Status: Abnormal   Collection Time: 08/30/18  5:33 PM  Result Value Ref Range   WBC 18.6 (H) 4.0 - 10.5 K/uL   RBC 3.51 (L) 4.22 - 5.81 MIL/uL   Hemoglobin 10.5 (L) 13.0 - 17.0 g/dL   HCT 33.3 (L) 39.0 - 52.0 %   MCV 94.9 80.0 - 100.0 fL   MCH 29.9 26.0 - 34.0 pg   MCHC 31.5 30.0 - 36.0 g/dL   RDW 12.6 11.5 - 15.5 %   Platelets 195 150 - 400 K/uL   nRBC 0.0 0.0 - 0.2 %    Comment: Performed at [facility], [address], [place], [place] [address]  Type and screen [facility]     Status: None (Preliminary result)   Collection Time: 08/30/18  5:33 PM  Result Value Ref Range   ABO/RH(D) A POS    Antibody Screen NEG    Sample Expiration 09/02/2018    Unit Number [id number]    Blood Component Type RED CELLS,LR    Unit division 00    Status of Unit ALLOCATED    Transfusion Status OK TO TRANSFUSE    Crossmatch Result Compatible    Unit Number [id number]    Blood Component Type RED CELLS,LR    Unit division 00    Status of Unit ALLOCATED    Transfusion Status OK TO TRANSFUSE  Crossmatch Result      Compatible Performed at [facility], [address], [place], [place] [address]   Lactic acid, plasma     Status: Abnormal   Collection Time: 08/30/18  5:33 PM  Result Value Ref Range   Lactic Acid, Venous 7.0 (HH) 0.5 - 1.9 mmol/L    Comment: CRITICAL RESULT CALLED TO, READ  BACK BY AND VERIFIED WITH: MOORE,M ON 08/30/18 AT 1825 BY LOY,C Performed at [facility], [address], [place], [place] [address]   Troponin I - ONCE - STAT     Status: None   Collection Time: 08/30/18  5:33 PM  Result Value Ref Range   Troponin I <0.03 <0.03 ng/mL    Comment: Performed at [facility], [address], [place], [place] [address]  Prepare RBC     Status: None   Collection Time: 08/30/18  5:33 PM  Result Value Ref Range   Order Confirmation      ORDER PROCESSED BY BLOOD BANK Performed at [facility], [address], [place], [place] [address]    Dg Chest 1 View  Result Date: 08/30/2018 CLINICAL DATA:  Trauma secondary to a fall today. Acute left femur fracture. EXAM: CHEST  1 VIEW COMPARISON:  Chest x-ray dated 04/05/2018 FINDINGS: The heart size and pulmonary vascularity are normal. Aortic atherosclerosis. The lungs are clear. No effusions. No acute bone abnormalities. IMPRESSION: No acute abnormalities. Aortic Atherosclerosis (ICD10-I70.0). Electronically Signed   By: Lorriane Shire M.D.   On: 08/30/2018 16:23   Dg Lumbar Spine 2-3 Views  Result Date: 08/30/2018 CLINICAL DATA:  Pain secondary to a fall today. Left femur fracture. EXAM: LUMBAR SPINE - 2-3 VIEW COMPARISON:  Radiographs dated 03/22/2017 and CT scan dated 12/26/2017 FINDINGS: There is a chronic lumbar scoliosis with convexity to the left centered at L2-3. There is no appreciable fracture. Multilevel degenerative disc disease. Osteophytes fuse the L1-2 and L2-3 levels. Prior CT scan demonstrates at the L5-S1 level is also fused. Aortic atherosclerosis. Moderately severe arthritis of the left hip joint. IMPRESSION: No acute abnormality of the lumbar spine. Degenerative changes as described. Electronically Signed   By: Lorriane Shire M.D.   On: 08/30/2018 16:26   Dg Knee 1-2 Views Right  Result Date: 08/30/2018 CLINICAL DATA:  Right knee pain secondary to a fall today. EXAM: RIGHT KNEE - 1-2 VIEW COMPARISON:   None FINDINGS: There is a comminuted impacted fracture of the distal right femoral shaft. There is a vertical component of the fracture which extends through the intercondylar notch. No dislocation. Diffuse osteopenia. Slight arthritic changes at the knee. IMPRESSION: Comminuted impacted distal right femur fracture extending through the intercondylar notch. Electronically Signed   By: Lorriane Shire M.D.   On: 08/30/2018 16:29   Ct Head Wo Contrast  Result Date: 08/30/2018 CLINICAL DATA:  Status post fall. EXAM: CT HEAD WITHOUT CONTRAST CT CERVICAL SPINE WITHOUT CONTRAST TECHNIQUE: Multidetector CT imaging of the head and cervical spine was performed following the standard protocol without intravenous contrast. Multiplanar CT image reconstructions of the cervical spine were also generated. COMPARISON:  Head CT 09/16/2005 FINDINGS: CT HEAD FINDINGS Brain: No evidence of acute infarction, hemorrhage, hydrocephalus, extra-axial collection or mass lesion/mass effect. Moderate brain parenchymal volume loss and deep white matter microangiopathy. Vascular: Calcific atherosclerotic disease. Skull: Normal. Negative for fracture or focal lesion. Sinuses/Orbits: No acute finding. Other: None. CT CERVICAL SPINE FINDINGS Alignment: Exaggerated cervical lordosis. Skull base and vertebrae: No acute fracture. No primary bone lesion or focal  pathologic process. Soft tissues and spinal canal: No prevertebral fluid or swelling. No visible canal hematoma. Disc levels: Multilevel osteoarthritic changes with moderate posterior facet arthropathy. Upper chest: Negative. Other: None. IMPRESSION: No acute intracranial abnormality. Atrophy, chronic microvascular disease. No evidence of acute traumatic injury to the cervical spine. Multilevel osteoarthritic changes with exaggerated cervical lordosis. Electronically Signed   By: Fidela Salisbury M.D.   On: 08/30/2018 16:59   Ct Cervical Spine Wo Contrast  Result Date:  08/30/2018 CLINICAL DATA:  Status post fall. EXAM: CT HEAD WITHOUT CONTRAST CT CERVICAL SPINE WITHOUT CONTRAST TECHNIQUE: Multidetector CT imaging of the head and cervical spine was performed following the standard protocol without intravenous contrast. Multiplanar CT image reconstructions of the cervical spine were also generated. COMPARISON:  Head CT 09/16/2005 FINDINGS: CT HEAD FINDINGS Brain: No evidence of acute infarction, hemorrhage, hydrocephalus, extra-axial collection or mass lesion/mass effect. Moderate brain parenchymal volume loss and deep white matter microangiopathy. Vascular: Calcific atherosclerotic disease. Skull: Normal. Negative for fracture or focal lesion. Sinuses/Orbits: No acute finding. Other: None. CT CERVICAL SPINE FINDINGS Alignment: Exaggerated cervical lordosis. Skull base and vertebrae: No acute fracture. No primary bone lesion or focal pathologic process. Soft tissues and spinal canal: No prevertebral fluid or swelling. No visible canal hematoma. Disc levels: Multilevel osteoarthritic changes with moderate posterior facet arthropathy. Upper chest: Negative. Other: None. IMPRESSION: No acute intracranial abnormality. Atrophy, chronic microvascular disease. No evidence of acute traumatic injury to the cervical spine. Multilevel osteoarthritic changes with exaggerated cervical lordosis. Electronically Signed   By: Fidela Salisbury M.D.   On: 08/30/2018 16:59   Ct Knee Right Wo Contrast  Result Date: 08/30/2018 CLINICAL DATA:  Patient suffered a distal left femur fracture in a fall today. Initial encounter. EXAM: CT OF THE RIGHT KNEE WITHOUT CONTRAST TECHNIQUE: Multidetector CT imaging of the right knee was performed according to the standard protocol. Multiplanar CT image reconstructions were also generated. COMPARISON:  Plain films right knee earlier today. FINDINGS: Bones/Joint/Cartilage As seen on the comparison plain films, the patient has a mildly comminuted and impacted  fracture through the metaphysis and diaphysis of the right femur. There is impaction of up to approximately 3 cm and posterior displacement of the distal fragment approximately 1 cm. Longitudinal split component of the fracture extends through the central aspect of the femoral trochlea with mild distraction anteriorly of 0.5 cm. Cortical irregularity along the periphery of the medial patellar facet may be due to a mild impaction fracture. No other fracture is identified. Bones are osteopenic. Degenerative change is present about the knee with some joint space narrowing and mild osteophytosis. Ligaments Suboptimally assessed by CT. The cruciate and collateral ligaments appear intact. Muscles and Tendons Intact. Soft tissues Hematoma is seen tracking superficial to the gastrocnemius musculature. The hematoma measures approximately 5 cm transverse by 1.5 cm AP by 7 cm craniocaudal. IMPRESSION: Mildly comminuted and impacted fracture through the diaphysis and metaphysis of the distal right femur includes a longitudinal component through the central aspect of femoral trochlea. Findings compatible with a mild impaction fracture through the periphery of the medial patellar facet. The fracture is incomplete. Ligamentous structures appear intact on CT scan. Hematoma in the posterior aspect of the calf. Electronically Signed   By: Inge Rise M.D.   On: 08/30/2018 19:48   Ct Abdomen Pelvis W Contrast  Result Date: 08/30/2018 CLINICAL DATA:  Hypotension today. Altered mental status. Status post fall today. EXAM: CT ABDOMEN AND PELVIS WITH CONTRAST TECHNIQUE: Multidetector CT imaging of the  abdomen and pelvis was performed using the standard protocol following bolus administration of intravenous contrast. CONTRAST:  100 mL OMNIPAQUE IOHEXOL 300 MG/ML  SOLN COMPARISON:  CT abdomen and pelvis 12/26/2017. Plain films left hip earlier today. FINDINGS: Lower chest: Mild dependent atelectasis is seen. Punctate calcified  granuloma left lower lobe is identified. Hepatobiliary: No focal liver abnormality is seen. Status post cholecystectomy. No biliary dilatation. Pancreas: Unremarkable. No pancreatic ductal dilatation or surrounding inflammatory changes. Spleen: Calcifications in the spleen consistent with old granulomatous disease noted. Spleen is normal in size. Adrenals/Urinary Tract: Single bilateral renal cysts are unchanged. The kidneys otherwise appear normal. No hydronephrosis or solid renal lesion. Urinary bladder is unremarkable. The adrenal glands appear normal. Stomach/Bowel: The patient is status post resection of the sigmoid colon with a left lower quadrant ostomy and associated large parastomal hernia, unchanged. The ascending colon is under distended but otherwise unremarkable. Scattered diverticulosis without diverticulitis is identified. No evidence of small bowel obstruction. The stomach is unremarkable. Vascular/Lymphatic: Atherosclerotic vascular disease noted. No aneurysm. No lymphadenopathy. Reproductive: Prostate gland is mildly enlarged, unchanged. Other: None. Musculoskeletal: Proximal left femur fracture is identified as seen on prior plain films. There is hematoma about the patient's fracture. No active extravasation of contrast is seen. The hematoma is incompletely visualized but is of approximately IMPRESSION: No acute abnormality abdomen or pelvis. Status post sigmoid colectomy. 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 Proximal left femur fracture with surrounding hematoma which is incompletely visualized. Atherosclerosis. Electronically Signed   By: Inge Rise M.D.   On: 08/30/2018 19:28   Dg Hips Bilat W Or Wo Pelvis 5 Views  Result Date: 08/30/2018 CLINICAL DATA:  Hip pain and right knee pain secondary to a fall today. EXAM: DG HIP (WITH OR WITHOUT PELVIS) 5+V BILAT COMPARISON:  None. FINDINGS: There is an angulated displaced overriding spiral fracture of the  proximal left femoral shaft. The fracture extends into the anterior or posterior cortex of the intertrochanteric region without definitive involvement of the greater and lesser trochanters. There is moderate arthritis of the left hip joint with joint space narrowing and a slight protrusio deformity. No discrete pelvic fractures. Diffuse osteopenia. Minimal arthritic changes of the right hip. IMPRESSION: Displaced angulated overriding fracture of the proximal left femoral shaft as described. Electronically Signed   By: Lorriane Shire M.D.   On: 08/30/2018 16:16    Review of Systems  All other systems reviewed and are negative.   Blood pressure (!) 72/56, pulse (!) 108, temperature 97.6 F (36.4 C), temperature source Oral, resp. rate 14, height 5\' 7"  (1.702 m), weight 73.5 kg, SpO2 100 %. Physical Exam  Constitutional: He has a sickly appearance.  HENT:  Head: Normocephalic and atraumatic.  Eyes: Pupils are equal, round, and reactive to light. EOM are normal.  Neck: Normal range of motion. Neck supple.  No cervical spine tenderness  Cardiovascular: Regular rhythm and normal heart sounds. Tachycardia present.  Pulses:      Carotid pulses are 2+ on the right side and 2+ on the left side.      Radial pulses are 2+ on the right side and 1+ on the left side.       Dorsalis pedis pulses are 2+ on the right side and 2+ on the left side.       Posterior tibial pulses are 2+ on the right side and 2+ on the left side.  Respiratory: Effort normal and breath sounds normal.  GI: Soft. Bowel sounds  are normal. He exhibits no distension. There is no abdominal tenderness.  Genitourinary:    Penis normal.   Musculoskeletal:     Right upper leg: He exhibits swelling.     Left upper leg: He exhibits swelling.  Skin: Skin is warm and dry.     Assessment/Plan Fall from level ground  Bilateral femur fractures-orthopedics has been consulted and will manage these injuries  Transient hypotension-secondary  to hypovolemia.  Will transfuse 2 units packed cells for now and resuscitate in the intensive care unit.  Additional IVs being placed here in the emergency room since the patient came with only 1 IV from referring hospital.  No signs of any intra-abdominal injury, intrathoracic injury, nor intracranial injury.  More than likely he is under resuscitated from bleeding from his femur fractures and will respond to resuscitation.  No family currently available but orthopedics evaluating.  Turner Daniels, MD 08/30/2018, 9:04 PM

## 2018-08-30 NOTE — ED Notes (Signed)
[facility] left unit with patient 

## 2018-08-30 NOTE — ED Notes (Signed)
Date and time results received: 08/30/18 1828 (use smartphrase ".now" to insert current time)  Test: lactic acid Critical Value: 7.0  Name of Provider Notified: Dr. Sedonia Small  Orders Received? Or Actions Taken?: yes

## 2018-08-30 NOTE — ED Provider Notes (Addendum)
  Provider Note MRN:  [id number]  Arrival date & time: 08/30/18    ED Course and Medical Decision Making  I received sign out for this patient at shift change from Dr. Thurnell Garbe.  Called to bedside for hypotension and lack of responsiveness.  Blood pressure 70s over 40s slowly improving to the 16X systolic.  Mental status improving, protecting airway, warm extremities.  Question of vagal response to pain.  However, given patient's significant x-ray findings, hypotension could be related to hypovolemic shock due to blood loss.  No obvious swelling of the legs to suggest blood loss in this area, will CT abdomen, will admit to [facility] trauma surgery.  Spoke with both orthopedics and trauma surgery, who agreed with the ED to ED transfer.  Dr. Vonna Kotyk long accepting physician.  Update: Patient was initially fluid responsive, received 1 L normal saline.  Has become hypotensive once again into the 45W systolic, heart rate 388, lactate returns at 7.0.  [facility] is here for transfer, awaiting return from CT imaging, will hang 2 units of crossmatched blood and transport.  Update:  CT revealed no intraabdominal injuries, BP 828 systolic, responding to PBCs, transported to [facility] for further care.  Critical Care Documentation Critical care time provided by me (excluding procedures): 45 minutes  Condition necessitating critical care: Concern for hypovolemic shock  Components of critical care management: reviewing of prior records, laboratory and imaging interpretation, frequent re-examination and reassessment of vital signs, administration of IV fluids, IV opioids, packed red blood cells, discussion with consulting services, facilitation of transfer   Barth Kirks. Sedonia Small, [place] [email or web address]    Maudie Flakes, MD 08/30/18 Marylouise Stacks, MD 08/30/18 Gayla Doss, MD 08/30/18 2016

## 2018-08-30 NOTE — Progress Notes (Addendum)
Orthopaedic Trauma Service   Orthopedic trauma service is aware of the patient. Patient was transferred from [facility] to [facility] for advanced level of care.  Patient has sustained significant injuries to bilateral femurs, left peritrochanteric hip fracture as well as a severely comminuted right intra-articular distal femur fracture  Patient will need to have these addressed surgically if he wishes to regain baseline function.  Hypotension is currently being addressed.  Given patient's injuries I would anticipate fairly significant blood loss which is likely the reason for his hypotension.  Per records it appears that he will be receiving 2 units of packed red blood cells at the trauma center.  He was unable to get blood product at the initial facility due to loss of peripheral access.  We will continue to monitor his vital signs as well as his laboratory values and overall clinical status.  Do think that it would be in the patient's best interest to proceed with surgical fixation sooner rather than later as he will not really be able to mobilize from bed effectively.  Early surgical intervention would also be a measure of pain control for the patient as well.  Orthopedic trauma service will see the patient first thing in the morning to evaluate for overall clinical stability and determine if surgical intervention is feasible tomorrow.  Please ensure the following are done in preparation for surgery tomorrow (all of these orders have been placed)  NPO  Type cross and hold 2 units of blood in addition to what he receives tonight coags in the morning  Repeat CBC in the morning  Repeat lactic acid in the morning  X-ray of left knee given ipsilateral hip fracture    Please ensure that family is available tomorrow morning to assist with consent will obtain consent tomorrow after speaking with the patient  After surgical intervention would anticipate him being weightbearing as  tolerated on the left leg and nonweightbearing on the right leg however I suspect he will be confined to a wheelchair for 6 to 8 weeks.  It also sounds as if that this was a very low energy mechanism injury.  Suspect profound osteoporosis/poor bone quality.  Will commence metabolic bone work-up.  Patient will need a DEXA scan as an outpatient will evaluate vitamin D status.  Further discussion regarding pharmacologic treatment of his osteoporosis.  Low-energy hip fracture as well as distal femur fracture essentially classifies him as having osteoporosis.  Patient has sustained pathologic fractures to bilateral femurs due to poor bone quality.  Aware of patient's history of rectal cancer.  Do not appreciate any lesions on plain imaging but again difficult to ascertain as both fractures are significantly impacted.  We may appreciate something different once traction was applied to both injuries.  Jari Pigg, PA-C [phone] (C) 08/30/2018, 10:10 PM  [facility] [place] [id number] [phone] (F)

## 2018-08-30 NOTE — ED Notes (Signed)
Dr. Brantley Stage  And   Dr. Grandville Silos (ortho) in to assess pt

## 2018-08-30 NOTE — ED Notes (Signed)
Date and time results received: 08/30/18 1830 (use smartphrase ".now" to insert current time)  Test: lactic acid Critical Value: 7.0  Name of Provider Notified: Dr. Sedonia Small  Orders Received? Or Actions Taken?: yes

## 2018-08-30 NOTE — ED Triage Notes (Signed)
Pt reports was walking at home and lost his balance and fell.  Pt says he landed on hit butt and c/o pain in both hips.

## 2018-08-30 NOTE — ED Provider Notes (Signed)
Assumed care from outlying hospital with interfacility transfer for trauma evaluation due to bilateral femur fractures.  Patient was apparently hypotensive at length hospital likely secondary due to volume loss from fever fractures.  Patient was started on blood outside hospital.  Patient had negative CT head, C-spine as well as abdomen pelvis hospital.  Discussed with trauma attending Dr. Brantley Stage who is in agreement with admission to their service.  Discussed with orthopedics Dr. Grandville Silos who is where the patient recommended knee immobilizer, Buck traction.  This to be performed by [facility] at bedside.  Physical exam positive for shortened left lower extremity as well as pain on palpation of both left and right thigh.  X-ray imaging as well as CT scans reviewed.  Patient admitted to the trauma ICU without further events here in the emergency department.  Patient hemodynamically stable.  The above care was discussed and agreed upon by my attending physician.   Orson Aloe, MD 08/31/18 2549    Margette Fast, MD 08/31/18 [phone]

## 2018-08-30 NOTE — ED Notes (Signed)
Attempted to do orthostatic vitals. Tried to sit the patient up on the side of the bed and patient was crying out in pain saying his hip hurts. Pt was not able to sit let alone stand.

## 2018-08-30 NOTE — ED Notes (Signed)
Pt was informed that we need a urine sample. Pt states that he can not urinate.

## 2018-08-30 NOTE — ED Notes (Signed)
IV access attempted by two RN's without success.

## 2018-08-30 NOTE — ED Notes (Signed)
Pt arrives via [place] from [facility] ED. First unit RBC currently infusing. Pt alert, answering questions appropriately. ED resident at bedside.

## 2018-08-30 NOTE — ED Provider Notes (Signed)
[facility] EMERGENCY DEPARTMENT Provider Note   CSN: [id number] Arrival date & time: 08/30/18  1338     History   Chief Complaint Chief Complaint  Patient presents with  . Fall    HPI Ralph Yang is a 85 y.o. male.  HPI  Pt was seen at 1340. Per EMS, pt's family and pt report: Pt c/o sudden onset and resolution of one episode of fall that occurred PTA. Pt states he was standing at his bedside, "felt like I was spinning," then fell onto his buttocks. Pt was unable to stand due to bilat hips "pain." Pt c/o right > left hips pain. Denies CP/palpitations, no SOB/cough, no abd pain, no N/V/D, no focal motor weakness, no tingling/numbness in extremities, no fevers, no rash, no neck or back pain, no syncope/LOC, no AMS.    Past Medical History:  Diagnosis Date  . Arthritis   . Cancer ([place])    rectal  . Diabetes mellitus without complication ([place])   . Hypertension     Patient Active Problem List   Diagnosis Date Noted  . Hyperlipidemia associated with type 2 diabetes mellitus ([place]) 12/31/2017  . Back pain 12/30/2017  . Failure to thrive in adult 12/26/2017  . Acute bilateral low back pain without sciatica 12/26/2017  . Hypertension   . Diabetes mellitus without complication ([place])   . Arthritis   . Cancer [facility])     Past Surgical History:  Procedure Laterality Date  . BACK SURGERY    . CHOLECYSTECTOMY    . COLOSTOMY    . COLOSTOMY          Home Medications    Prior to Admission medications   Medication Sig Start Date End Date Taking? Authorizing Provider  acetaminophen (TYLENOL) 325 MG tablet Take 650 mg by mouth 2 (two) times daily.    [provider]  Janne [employer] [facility]) OINT Apply to sacrum and bilateral buttocks every shift    [provider]  Cyanocobalamin (VITAMIN B 12 PO) Take 500 mcg by mouth daily. [place]    [provider]  glyBURIDE-metformin (GLUCOVANCE) 5-500 MG tablet Give 1/2 tab (2.5-250 mg) by mouth  once a morning    [provider]  lisinopril (PRINIVIL,ZESTRIL) 5 MG tablet Take 5 mg by mouth daily. 07/06/17   [provider]  metFORMIN (GLUCOPHAGE) 500 MG tablet Take 500 mg by mouth every evening.    [provider]  tamsulosin (FLOMAX) 0.4 MG CAPS capsule Take 1 capsule (0.4 mg total) by mouth daily. 07/30/17   Virgel Manifold, MD  traMADol (ULTRAM) 50 MG tablet Take 50 mg by mouth 2 (two) times daily.    [provider]  Vitamin D, Cholecalciferol, 1000 UNITS TABS Take 1,000 Units by mouth every morning.     [provider]    Family History No family history on file.  Social History Social History   Tobacco Use  . Smoking status: Never Smoker  . Smokeless tobacco: Never Used  Substance Use Topics  . Alcohol use: No  . Drug use: No     Allergies   Sulfa antibiotics   Review of Systems Review of Systems ROS: Statement: All systems negative except as marked or noted in the HPI; Constitutional: Negative for fever and chills. ; ; Eyes: Negative for eye pain, redness and discharge. ; ; ENMT: Negative for ear pain, hoarseness, nasal congestion, sinus pressure and sore throat. ; ; Cardiovascular: Negative for chest pain, palpitations, diaphoresis, dyspnea and  peripheral edema. ; ; Respiratory: Negative for cough, wheezing and stridor. ; ; Gastrointestinal: Negative for nausea, vomiting, diarrhea, abdominal pain, blood in stool, hematemesis, jaundice and rectal bleeding. . ; ; Genitourinary: Negative for dysuria, flank pain and hematuria. ; ; Musculoskeletal: +bilat hips pain. Negative for back pain and neck pain. Negative for swelling and deformity.; ; Skin: Negative for pruritus, rash, abrasions, blisters, bruising and skin lesion.; ; Neuro: +"spinning." Negative for headache, lightheadedness and neck stiffness. Negative for weakness, altered level of consciousness, altered mental status, extremity weakness, paresthesias, involuntary  movement, seizure and syncope.       Physical Exam Updated Vital Signs BP 124/71 (BP Location: Left Arm)   Pulse 89   Temp 97.6 F (36.4 C) (Oral)   Resp 16   Ht 5\' 7"  (1.702 m)   Wt 73.5 kg   SpO2 98%   BMI 25.37 kg/m   Physical Exam 1345: Physical examination:  Nursing notes reviewed; Vital signs and O2 SAT reviewed;  Constitutional: Well developed, Well nourished, In no acute distress; Head:  Normocephalic, atraumatic; Eyes: EOMI, PERRL, No scleral icterus; ENMT: Mouth and pharynx normal, Mucous membranes dry; Neck: Supple, Full range of motion, No lymphadenopathy; Cardiovascular: Regular rate and rhythm, No gallop; Respiratory: Breath sounds clear & equal bilaterally, No wheezes.  Speaking full sentences with ease, Normal respiratory effort/excursion; Chest: Nontender, Movement normal; Abdomen: Soft, Nontender, Nondistended, Normal bowel sounds. +colostomy with soft brown stool.; Genitourinary: No CVA tenderness; Spine:  No midline CS, TS, LS tenderness.;; Extremities: Pelvis stable. +R>L bilat hips tenderness to palp. NMS intact distally bilat LE's, palp pedal pulses. NT bilat knees/ankles/feet. +1 pedal edema bilat. No calf tenderness, edema or asymmetry.; Neuro: AA&Ox3, Major CN grossly intact. No facial droop. +left horizontal end gaze fatigable nystagmus. Speech clear. Grips equal. Strength 5/5 equal bilat UE's. Pt unable to lift bilat LE's up off stretcher due to hips pain, otherwise gross focal motor deficits in extremities.; Skin: Color normal, Warm, Dry.     ED Treatments / Results  Labs (all labs ordered are listed, but only abnormal results are displayed)   EKG EKG Interpretation  Date/Time:  Monday August 30 2018 14:13:02 EST Ventricular Rate:  85 PR Interval:    QRS Duration: 86 QT Interval:  409 QTC Calculation: 487 R Axis:   52 Text Interpretation:  Sinus rhythm Borderline prolonged QT interval No old tracing to compare Confirmed by Francine Graven  [id number]) on 08/30/2018 2:40:06 PM   Radiology   Procedures Procedures (including critical care time)  Medications Ordered in ED Medications - No data to display   Initial Impression / Assessment and Plan / ED Course  I have reviewed the triage vital signs and the nursing notes.  Pertinent labs & imaging results that were available during my care of the patient were reviewed by me and considered in my medical decision making (see chart for details).  MDM Reviewed: previous chart, nursing note and vitals Reviewed previous: labs and ECG Interpretation: labs, ECG, x-ray and CT scan   Results for orders placed or performed during the hospital encounter of 08/30/18  Comprehensive metabolic panel  Result Value Ref Range   Sodium 137 135 - 145 mmol/L   Potassium 4.4 3.5 - 5.1 mmol/L   Chloride 103 98 - 111 mmol/L   CO2 21 (L) 22 - 32 mmol/L   Glucose, Bld 173 (H) 70 - 99 mg/dL   BUN 14 8 - 23 mg/dL   Creatinine, Ser 0.91 0.61 - 1.24 mg/dL  Calcium 8.8 (L) 8.9 - 10.3 mg/dL   Total Protein 6.1 (L) 6.5 - 8.1 g/dL   Albumin 3.3 (L) 3.5 - 5.0 g/dL   AST 22 15 - 41 U/L   ALT 19 0 - 44 U/L   Alkaline Phosphatase 65 38 - 126 U/L   Total Bilirubin 0.5 0.3 - 1.2 mg/dL   GFR calc non Af Amer >60 >60 mL/min   GFR calc Af Amer >60 >60 mL/min   Anion gap 13 5 - 15  Troponin I - Once  Result Value Ref Range   Troponin I <0.03 <0.03 ng/mL  CBC with Differential  Result Value Ref Range   WBC 9.0 4.0 - 10.5 K/uL   RBC 3.80 (L) 4.22 - 5.81 MIL/uL   Hemoglobin 11.3 (L) 13.0 - 17.0 g/dL   HCT 35.2 (L) 39.0 - 52.0 %   MCV 92.6 80.0 - 100.0 fL   MCH 29.7 26.0 - 34.0 pg   MCHC 32.1 30.0 - 36.0 g/dL   RDW 12.7 11.5 - 15.5 %   Platelets 169 150 - 400 K/uL   nRBC 0.0 0.0 - 0.2 %   Neutrophils Relative % 62 %   Neutro Abs 5.6 1.7 - 7.7 K/uL   Lymphocytes Relative 27 %   Lymphs Abs 2.4 0.7 - 4.0 K/uL   Monocytes Relative 6 %   Monocytes Absolute 0.5 0.1 - 1.0 K/uL   Eosinophils Relative 4  %   Eosinophils Absolute 0.3 0.0 - 0.5 K/uL   Basophils Relative 0 %   Basophils Absolute 0.0 0.0 - 0.1 K/uL   Immature Granulocytes 1 %   Abs Immature Granulocytes 0.07 0.00 - 0.07 K/uL    1555:  Called by XR:  Pt now c/o right knee pain, XR ordered. Denies left knee pain. Denies upper extremities pain. Initial XR of bilat hips and right knee wet reads by myself: fx left proximal femur and fx right distal femur. Pt's wife informed. Pt will need admit/transfer to [facility] for Ortho MD; wife agreeable with this plan. Sign out to Dr. Sedonia Small.       Final Clinical Impressions(s) / ED Diagnoses   Final diagnoses:  None    ED Discharge Orders    None       Francine Graven, DO 09/01/18 1853

## 2018-08-30 NOTE — ED Notes (Signed)
IV team at bedside at this time. 

## 2018-08-30 NOTE — ED Notes (Signed)
Patient transported to X-ray 

## 2018-08-31 ENCOUNTER — Inpatient Hospital Stay (HOSPITAL_COMMUNITY): Payer: Medicare HMO

## 2018-08-31 DIAGNOSIS — S72401A Unspecified fracture of lower end of right femur, initial encounter for closed fracture: Secondary | ICD-10-CM | POA: Insufficient documentation

## 2018-08-31 LAB — CBC
HCT: 40.4 % (ref 39.0–52.0)
HCT: 33.8 % — ABNORMAL LOW (ref 39.0–52.0)
Hemoglobin: 13.4 g/dL (ref 13.0–17.0)
Hemoglobin: 11.3 g/dL — ABNORMAL LOW (ref 13.0–17.0)
MCH: 29 pg (ref 26.0–34.0)
MCH: 29.1 pg (ref 26.0–34.0)
MCHC: 33.2 g/dL (ref 30.0–36.0)
MCHC: 33.4 g/dL (ref 30.0–36.0)
MCV: 87.4 fL (ref 80.0–100.0)
MCV: 87.1 fL (ref 80.0–100.0)
NRBC: 0 % (ref 0.0–0.2)
Platelets: 135 10*3/uL — ABNORMAL LOW (ref 150–400)
Platelets: 116 10*3/uL — ABNORMAL LOW (ref 150–400)
RBC: 4.62 MIL/uL (ref 4.22–5.81)
RBC: 3.88 MIL/uL — AB (ref 4.22–5.81)
RDW: 13.5 % (ref 11.5–15.5)
RDW: 14 % (ref 11.5–15.5)
WBC: 12.1 10*3/uL — ABNORMAL HIGH (ref 4.0–10.5)
WBC: 11.9 10*3/uL — ABNORMAL HIGH (ref 4.0–10.5)
nRBC: 0 % (ref 0.0–0.2)

## 2018-08-31 LAB — GLUCOSE, CAPILLARY
Glucose-Capillary: 102 mg/dL — ABNORMAL HIGH (ref 70–99)
Glucose-Capillary: 115 mg/dL — ABNORMAL HIGH (ref 70–99)
Glucose-Capillary: 161 mg/dL — ABNORMAL HIGH (ref 70–99)
Glucose-Capillary: 191 mg/dL — ABNORMAL HIGH (ref 70–99)
Glucose-Capillary: 211 mg/dL — ABNORMAL HIGH (ref 70–99)
Glucose-Capillary: 238 mg/dL — ABNORMAL HIGH (ref 70–99)

## 2018-08-31 LAB — PHOSPHORUS: Phosphorus: 3.6 mg/dL (ref 2.5–4.6)

## 2018-08-31 LAB — LACTIC ACID, PLASMA
Lactic Acid, Venous: 3.7 mmol/L (ref 0.5–1.9)
Lactic Acid, Venous: 3.6 mmol/L (ref 0.5–1.9)

## 2018-08-31 LAB — COMPREHENSIVE METABOLIC PANEL
ALT: 18 U/L (ref 0–44)
AST: 43 U/L — AB (ref 15–41)
Albumin: 2.8 g/dL — ABNORMAL LOW (ref 3.5–5.0)
Alkaline Phosphatase: 50 U/L (ref 38–126)
Anion gap: 13 (ref 5–15)
BUN: 17 mg/dL (ref 8–23)
CO2: 21 mmol/L — ABNORMAL LOW (ref 22–32)
Calcium: 8.3 mg/dL — ABNORMAL LOW (ref 8.9–10.3)
Chloride: 106 mmol/L (ref 98–111)
Creatinine, Ser: 1.4 mg/dL — ABNORMAL HIGH (ref 0.61–1.24)
GFR calc Af Amer: 54 mL/min — ABNORMAL LOW (ref >60)
GFR calc non Af Amer: 46 mL/min — ABNORMAL LOW (ref >60)
Glucose, Bld: 243 mg/dL — ABNORMAL HIGH (ref 70–99)
Potassium: 5 mmol/L (ref 3.5–5.1)
Sodium: 140 mmol/L (ref 135–145)
Total Bilirubin: 1.9 mg/dL — ABNORMAL HIGH (ref 0.3–1.2)
Total Protein: 5 g/dL — ABNORMAL LOW (ref 6.5–8.1)

## 2018-08-31 LAB — HEMOGLOBIN A1C
Hgb A1c MFr Bld: 6.6 % — ABNORMAL HIGH (ref 4.8–5.6)
Mean Plasma Glucose: 142.72 mg/dL

## 2018-08-31 LAB — TSH: TSH: 0.94 u[IU]/mL (ref 0.350–4.500)

## 2018-08-31 LAB — PREALBUMIN: Prealbumin: 14.7 mg/dL — ABNORMAL LOW (ref 18–38)

## 2018-08-31 LAB — MAGNESIUM: Magnesium: 1.3 mg/dL — ABNORMAL LOW (ref 1.7–2.4)

## 2018-08-31 LAB — PREPARE RBC (CROSSMATCH)

## 2018-08-31 LAB — MRSA PCR SCREENING: MRSA by PCR: NEGATIVE

## 2018-08-31 MED ORDER — SODIUM CHLORIDE 0.9% IV SOLUTION: Freq: Once | INTRAVENOUS | Status: DC

## 2018-08-31 MED ORDER — ALBUMIN HUMAN 5 % IV SOLN
25.0000 g | Freq: Once | INTRAVENOUS | Status: AC
  Administered 2018-08-31: 25 g via INTRAVENOUS
  Filled 2018-08-31: qty 500

## 2018-08-31 MED ORDER — ACETAMINOPHEN 10 MG/ML IV SOLN
1000.0000 mg | Freq: Four times a day (QID) | INTRAVENOUS | Status: AC
  Administered 2018-08-31 – 2018-09-01 (×4): 1000 mg via INTRAVENOUS
  Filled 2018-08-31 (×4): qty 100

## 2018-08-31 MED ORDER — MAGNESIUM SULFATE 2 GM/50ML IV SOLN
2.0000 g | Freq: Once | INTRAVENOUS | Status: AC
  Administered 2018-08-31: 2 g via INTRAVENOUS
  Filled 2018-08-31: qty 50

## 2018-08-31 NOTE — Progress Notes (Signed)
CRITICAL VALUE ALERT  Critical Value:  Lactic Acid 3.7  Date & Time Notied:  08/31/2018 7:23  Provider Notified: Dr Grandville Silos  Orders Received/Actions taken: Albumin - 500 mL

## 2018-08-31 NOTE — Consult Note (Signed)
ORTHOPAEDIC CONSULTATION HISTORY & PHYSICAL REQUESTING PHYSICIAN: Md, Trauma, MD  Chief Complaint: B femur fx  HPI: Ralph Yang is a 85 y.o. male with a history of rectal cancer and diabetes who lost his balance and fell backwards today, injuring both legs.  He was initially evaluated at the emergency department at [facility], where preliminary evaluation revealed bilateral femur fractures, the right a distal supracondylar/intracondylar and the left a proximal diaphyseal fracture.  Arrangements were already made for him to be transferred to [facility], admitted to the hospitalist service, when I was consulted.  However, due to an episode of hypotension in the emergency department, he was transferred to [facility] to the trauma service and is undergoing resuscitation in anticipation/preparation for surgical treatment of his fractures on Tuesday.  His wife and niece are present and have provided significant portions of the history.  He ambulates about the house with a cane or walker, and not much outside the house.  Past Medical History:  Diagnosis Date  . Arthritis   . Cancer ([place])    rectal  . Diabetes mellitus without complication ([place])   . Hypertension    Past Surgical History:  Procedure Laterality Date  . BACK SURGERY    . CHOLECYSTECTOMY    . COLOSTOMY    . COLOSTOMY     Social History   Socioeconomic History  . Marital status: Married    Spouse name: Not on file  . Number of children: Not on file  . Years of education: Not on file  . Highest education level: Not on file  Occupational History  . Not on file  Social Needs  . Financial resource strain: Not on file  . Food insecurity:    Worry: Not on file    Inability: Not on file  . Transportation needs:    Medical: Not on file    Non-medical: Not on file  Tobacco Use  . Smoking status: Never Smoker  . Smokeless tobacco: Never Used  Substance and Sexual Activity  . Alcohol use: No  . Drug use: No  . Sexual  activity: Not on file  Lifestyle  . Physical activity:    Days per week: Not on file    Minutes per session: Not on file  . Stress: Not on file  Relationships  . Social connections:    Talks on phone: Not on file    Gets together: Not on file    Attends religious service: Not on file    Active member of club or organization: Not on file    Attends meetings of clubs or organizations: Not on file    Relationship status: Not on file  Other Topics Concern  . Not on file  Social History Narrative  . Not on file   No family history on file. Allergies  Allergen Reactions  . Sulfa Antibiotics Nausea Only   Prior to Admission medications   Medication Sig Start Date End Date Taking? Authorizing Provider  acetaminophen (TYLENOL) 325 MG tablet Take 650 mg by mouth 2 (two) times daily.   Yes [provider]  Cyanocobalamin (VITAMIN B 12 PO) Take 500 mcg by mouth daily. GUMMIES   Yes [provider]  lisinopril (PRINIVIL,ZESTRIL) 5 MG tablet Take 5 mg by mouth daily. 07/06/17  Yes [provider]  metFORMIN (GLUCOPHAGE) 500 MG tablet Take 500 mg by mouth 2 (two) times daily with a meal.    Yes [provider]  tamsulosin (FLOMAX) 0.4 MG CAPS  capsule Take 1 capsule (0.4 mg total) by mouth daily. Patient taking differently: Take 0.8 mg by mouth daily.  07/30/17  Yes Virgel Manifold, MD  traMADol (ULTRAM) 50 MG tablet Take 50 mg by mouth every 4 (four) hours as needed for moderate pain.    Yes [provider]  Vitamin D, Cholecalciferol, 1000 UNITS TABS Take 1,000 Units by mouth every morning.    Yes [provider]  Janne Lab Oil [facility]) OINT Apply to sacrum and bilateral buttocks every shift    [provider]   Dg Chest 1 View  Result Date: 08/30/2018 CLINICAL DATA:  Trauma secondary to a fall today. Acute left femur fracture. EXAM: CHEST  1 VIEW COMPARISON:  Chest x-ray dated 04/05/2018 FINDINGS: The heart size and  pulmonary vascularity are normal. Aortic atherosclerosis. The lungs are clear. No effusions. No acute bone abnormalities. IMPRESSION: No acute abnormalities. Aortic Atherosclerosis (ICD10-I70.0). Electronically Signed   By: Lorriane Shire M.D.   On: 08/30/2018 16:23   Dg Lumbar Spine 2-3 Views  Result Date: 08/30/2018 CLINICAL DATA:  Pain secondary to a fall today. Left femur fracture. EXAM: LUMBAR SPINE - 2-3 VIEW COMPARISON:  Radiographs dated 03/22/2017 and CT scan dated 12/26/2017 FINDINGS: There is a chronic lumbar scoliosis with convexity to the left centered at L2-3. There is no appreciable fracture. Multilevel degenerative disc disease. Osteophytes fuse the L1-2 and L2-3 levels. Prior CT scan demonstrates at the L5-S1 level is also fused. Aortic atherosclerosis. Moderately severe arthritis of the left hip joint. IMPRESSION: No acute abnormality of the lumbar spine. Degenerative changes as described. Electronically Signed   By: Lorriane Shire M.D.   On: 08/30/2018 16:26   Dg Knee 1-2 Views Left  Result Date: 08/30/2018 CLINICAL DATA:  Left hip fracture.  Left knee pain EXAM: LEFT KNEE - 1-2 VIEW COMPARISON:  None FINDINGS: Moderate tricompartment degenerative changes with joint space narrowing and spurring. Small joint effusion. No acute bony abnormality. Specifically, no fracture, subluxation, or dislocation. IMPRESSION: Moderate osteoarthritis with small joint effusion. No acute bony abnormality. Electronically Signed   By: Rolm Baptise M.D.   On: 08/30/2018 22:52   Dg Knee 1-2 Views Right  Result Date: 08/30/2018 CLINICAL DATA:  Right knee pain secondary to a fall today. EXAM: RIGHT KNEE - 1-2 VIEW COMPARISON:  None FINDINGS: There is a comminuted impacted fracture of the distal right femoral shaft. There is a vertical component of the fracture which extends through the intercondylar notch. No dislocation. Diffuse osteopenia. Slight arthritic changes at the knee. IMPRESSION: Comminuted impacted  distal right femur fracture extending through the intercondylar notch. Electronically Signed   By: Lorriane Shire M.D.   On: 08/30/2018 16:29   Ct Head Wo Contrast  Result Date: 08/30/2018 CLINICAL DATA:  Status post fall. EXAM: CT HEAD WITHOUT CONTRAST CT CERVICAL SPINE WITHOUT CONTRAST TECHNIQUE: Multidetector CT imaging of the head and cervical spine was performed following the standard protocol without intravenous contrast. Multiplanar CT image reconstructions of the cervical spine were also generated. COMPARISON:  Head CT 09/16/2005 FINDINGS: CT HEAD FINDINGS Brain: No evidence of acute infarction, hemorrhage, hydrocephalus, extra-axial collection or mass lesion/mass effect. Moderate brain parenchymal volume loss and deep white matter microangiopathy. Vascular: Calcific atherosclerotic disease. Skull: Normal. Negative for fracture or focal lesion. Sinuses/Orbits: No acute finding. Other: None. CT CERVICAL SPINE FINDINGS Alignment: Exaggerated cervical lordosis. Skull base and vertebrae: No acute fracture. No primary bone lesion or focal pathologic process. Soft tissues and spinal canal: No prevertebral fluid  or swelling. No visible canal hematoma. Disc levels: Multilevel osteoarthritic changes with moderate posterior facet arthropathy. Upper chest: Negative. Other: None. IMPRESSION: No acute intracranial abnormality. Atrophy, chronic microvascular disease. No evidence of acute traumatic injury to the cervical spine. Multilevel osteoarthritic changes with exaggerated cervical lordosis. Electronically Signed   By: Fidela Salisbury M.D.   On: 08/30/2018 16:59   Ct Cervical Spine Wo Contrast  Result Date: 08/30/2018 CLINICAL DATA:  Status post fall. EXAM: CT HEAD WITHOUT CONTRAST CT CERVICAL SPINE WITHOUT CONTRAST TECHNIQUE: Multidetector CT imaging of the head and cervical spine was performed following the standard protocol without intravenous contrast. Multiplanar CT image reconstructions of the  cervical spine were also generated. COMPARISON:  Head CT 09/16/2005 FINDINGS: CT HEAD FINDINGS Brain: No evidence of acute infarction, hemorrhage, hydrocephalus, extra-axial collection or mass lesion/mass effect. Moderate brain parenchymal volume loss and deep white matter microangiopathy. Vascular: Calcific atherosclerotic disease. Skull: Normal. Negative for fracture or focal lesion. Sinuses/Orbits: No acute finding. Other: None. CT CERVICAL SPINE FINDINGS Alignment: Exaggerated cervical lordosis. Skull base and vertebrae: No acute fracture. No primary bone lesion or focal pathologic process. Soft tissues and spinal canal: No prevertebral fluid or swelling. No visible canal hematoma. Disc levels: Multilevel osteoarthritic changes with moderate posterior facet arthropathy. Upper chest: Negative. Other: None. IMPRESSION: No acute intracranial abnormality. Atrophy, chronic microvascular disease. No evidence of acute traumatic injury to the cervical spine. Multilevel osteoarthritic changes with exaggerated cervical lordosis. Electronically Signed   By: Fidela Salisbury M.D.   On: 08/30/2018 16:59   Ct Knee Right Wo Contrast  Result Date: 08/30/2018 CLINICAL DATA:  Patient suffered a distal left femur fracture in a fall today. Initial encounter. EXAM: CT OF THE RIGHT KNEE WITHOUT CONTRAST TECHNIQUE: Multidetector CT imaging of the right knee was performed according to the standard protocol. Multiplanar CT image reconstructions were also generated. COMPARISON:  Plain films right knee earlier today. FINDINGS: Bones/Joint/Cartilage As seen on the comparison plain films, the patient has a mildly comminuted and impacted fracture through the metaphysis and diaphysis of the right femur. There is impaction of up to approximately 3 cm and posterior displacement of the distal fragment approximately 1 cm. Longitudinal split component of the fracture extends through the central aspect of the femoral trochlea with mild  distraction anteriorly of 0.5 cm. Cortical irregularity along the periphery of the medial patellar facet may be due to a mild impaction fracture. No other fracture is identified. Bones are osteopenic. Degenerative change is present about the knee with some joint space narrowing and mild osteophytosis. Ligaments Suboptimally assessed by CT. The cruciate and collateral ligaments appear intact. Muscles and Tendons Intact. Soft tissues Hematoma is seen tracking superficial to the gastrocnemius musculature. The hematoma measures approximately 5 cm transverse by 1.5 cm AP by 7 cm craniocaudal. IMPRESSION: Mildly comminuted and impacted fracture through the diaphysis and metaphysis of the distal right femur includes a longitudinal component through the central aspect of femoral trochlea. Findings compatible with a mild impaction fracture through the periphery of the medial patellar facet. The fracture is incomplete. Ligamentous structures appear intact on CT scan. Hematoma in the posterior aspect of the calf. Electronically Signed   By: Inge Rise M.D.   On: 08/30/2018 19:48   Ct Abdomen Pelvis W Contrast  Result Date: 08/30/2018 CLINICAL DATA:  Hypotension today. Altered mental status. Status post fall today. EXAM: CT ABDOMEN AND PELVIS WITH CONTRAST TECHNIQUE: Multidetector CT imaging of the abdomen and pelvis was performed using the standard protocol following  bolus administration of intravenous contrast. CONTRAST:  100 mL OMNIPAQUE IOHEXOL 300 MG/ML  SOLN COMPARISON:  CT abdomen and pelvis 12/26/2017. Plain films left hip earlier today. FINDINGS: Lower chest: Mild dependent atelectasis is seen. Punctate calcified granuloma left lower lobe is identified. Hepatobiliary: No focal liver abnormality is seen. Status post cholecystectomy. No biliary dilatation. Pancreas: Unremarkable. No pancreatic ductal dilatation or surrounding inflammatory changes. Spleen: Calcifications in the spleen consistent with old  granulomatous disease noted. Spleen is normal in size. Adrenals/Urinary Tract: Single bilateral renal cysts are unchanged. The kidneys otherwise appear normal. No hydronephrosis or solid renal lesion. Urinary bladder is unremarkable. The adrenal glands appear normal. Stomach/Bowel: The patient is status post resection of the sigmoid colon with a left lower quadrant ostomy and associated large parastomal hernia, unchanged. The ascending colon is under distended but otherwise unremarkable. Scattered diverticulosis without diverticulitis is identified. No evidence of small bowel obstruction. The stomach is unremarkable. Vascular/Lymphatic: Atherosclerotic vascular disease noted. No aneurysm. No lymphadenopathy. Reproductive: Prostate gland is mildly enlarged, unchanged. Other: None. Musculoskeletal: Proximal left femur fracture is identified as seen on prior plain films. There is hematoma about the patient's fracture. No active extravasation of contrast is seen. The hematoma is incompletely visualized but is of approximately IMPRESSION: No acute abnormality abdomen or pelvis. Status post sigmoid colectomy. 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 Proximal left femur fracture with surrounding hematoma which is incompletely visualized. Atherosclerosis. Electronically Signed   By: Inge Rise M.D.   On: 08/30/2018 19:28   Dg Hips Bilat W Or Wo Pelvis 5 Views  Result Date: 08/30/2018 CLINICAL DATA:  Hip pain and right knee pain secondary to a fall today. EXAM: DG HIP (WITH OR WITHOUT PELVIS) 5+V BILAT COMPARISON:  None. FINDINGS: There is an angulated displaced overriding spiral fracture of the proximal left femoral shaft. The fracture extends into the anterior or posterior cortex of the intertrochanteric region without definitive involvement of the greater and lesser trochanters. There is moderate arthritis of the left hip joint with joint space narrowing and a slight protrusio deformity.  No discrete pelvic fractures. Diffuse osteopenia. Minimal arthritic changes of the right hip. IMPRESSION: Displaced angulated overriding fracture of the proximal left femoral shaft as described. Electronically Signed   By: Lorriane Shire M.D.   On: 08/30/2018 16:16    Positive ROS: All other systems have been reviewed and were otherwise negative with the exception of those mentioned in the HPI and as above.  Physical Exam: Vitals: Refer to EMR. Constitutional:  WD, WN, NAD HEENT:  NCAT, EOMI Neuro/Psych:  Alert & oriented to person, place, and time; appropriate mood & affect Lymphatic: No generalized extremity edema or lymphadenopathy Extremities / MSK:  The extremities are normal with respect to appearance, ranges of motion, joint stability, muscle strength/tone, sensation, & perfusion except as otherwise noted:  At present, the left lower extremity is splinted with pillows and findings.  The right lower extremity has a knee immobilizer in place.  He is able to detect light touch on the plantar and dorsal surfaces of both feet, including the first webspace, and he is noted to flex and extend the ankle and toes on both feet.  The toes are reasonably warm, dorsalis pedis pulse palpable bilaterally.  He has tenderness to palpation about the right distal femur and the left mid/proximal femur.  No upper extremity tenderness.  No associated open wounds.  Assessment: Closed bilateral femur fractures, left proximal and right distal.  Plan: I discussed  these findings with him and with his wife and niece.  I indicated that he would likely benefit from surgery to stabilize the fractures with a goal to restore his baseline level of ambulation, although quite often there can be a slight decline in ambulatory ability with such injuries.  Nonetheless, the risks of bedrest outweigh those of operative treatment.  He is presently undergoing resuscitation with the transfusion of blood and fluids in preparation for  surgery tomorrow.  I have spoken to the orthopedic trauma service about this patient, and the tentative plan is to proceed surgically on Tuesday, provided his condition is optimal to proceed. I spoke with the patient's wife regarding this plan, to include the goals, risk, and options and consent was obtained, the document executed and placed on the chart.  Rayvon Char [address], [place] [place], [place]  [address] Office: [phone] Mobile: [phone]  08/31/2018, 12:09 AM

## 2018-08-31 NOTE — H&P (View-Only) (Signed)
Orthopaedic Trauma Service (OTS) Consult   Patient ID: Ralph Yang MRN: [id number] DOB/AGE: 08-02-1933 85 y.o.   Reason for Consult: Ground-level fall with left hip fracture and right distal femur fracture Referring Physician: D. [place] Silos, MD (Ortho)   HPI: Ralph Yang is an 85 y.o.white male with history notable for rectal cancer status post colostomy, and diabetes and hypertension, chronic back pain on chronic tramadol treatment who presented to [facility] after sustaining a ground-level fall while at home.  Patient was found to have left hip fracture and right distal femur fracture.  Patient became hypotensive and due to the constellation of his injuries was transferred to [facility] hospital for further treatment.  Patient was admitted to the trauma service with orthopedic trauma service consultation.  Patient was transfused with 2 units of packed red blood cells on admission to [facility] hospital yesterday evening he is responded very nicely overnight.  He is having moderate pain but tolerable with pain medicine.  Patient lives at home with his wife and son  He uses a walker at baseline   Patient denies any numbness or tingling in his lower extremities.  Denies any injuries to his upper extremities   Lactic acid on admission was 7.0 this is trended down to 3.6  Past Medical History:  Diagnosis Date  . Arthritis   . Cancer ([place])    rectal  . Diabetes mellitus without complication ([place])   . Hypertension     Past Surgical History:  Procedure Laterality Date  . BACK SURGERY    . CHOLECYSTECTOMY    . COLOSTOMY    . COLOSTOMY      No family history on file.  Social History:  reports that he has never smoked. He has never used smokeless tobacco. He reports that he does not drink alcohol or use drugs.  Allergies:  Allergies  Allergen Reactions  . Sulfa Antibiotics Nausea Only    Medications: I have reviewed the patient's current medications. Current  Meds  Medication Sig  . acetaminophen (TYLENOL) 325 MG tablet Take 650 mg by mouth 2 (two) times daily.  . Cyanocobalamin (VITAMIN B 12 PO) Take 500 mcg by mouth daily. GUMMIES  . lisinopril (PRINIVIL,ZESTRIL) 5 MG tablet Take 5 mg by mouth daily.  . metFORMIN (GLUCOPHAGE) 500 MG tablet Take 500 mg by mouth 2 (two) times daily with a meal.   . tamsulosin (FLOMAX) 0.4 MG CAPS capsule Take 1 capsule (0.4 mg total) by mouth daily. (Patient taking differently: Take 0.8 mg by mouth daily. )  . traMADol (ULTRAM) 50 MG tablet Take 50 mg by mouth every 4 (four) hours as needed for moderate pain.   . Vitamin D, Cholecalciferol, 1000 UNITS TABS Take 1,000 Units by mouth every morning.      Results for orders placed or performed during the hospital encounter of 08/30/18 (from the past 48 hour(s))  Comprehensive metabolic panel     Status: Abnormal   Collection Time: 08/30/18  2:28 PM  Result Value Ref Range   Sodium 137 135 - 145 mmol/L   Potassium 4.4 3.5 - 5.1 mmol/L   Chloride 103 98 - 111 mmol/L   CO2 21 (L) 22 - 32 mmol/L   Glucose, Bld 173 (H) 70 - 99 mg/dL   BUN 14 8 - 23 mg/dL   Creatinine, Ser 0.91 0.61 - 1.24 mg/dL   Calcium 8.8 (L) 8.9 - 10.3 mg/dL   Total Protein 6.1 (L) 6.5 -  8.1 g/dL   Albumin 3.3 (L) 3.5 - 5.0 g/dL   AST 22 15 - 41 U/L   ALT 19 0 - 44 U/L   Alkaline Phosphatase 65 38 - 126 U/L   Total Bilirubin 0.5 0.3 - 1.2 mg/dL   GFR calc non Af Amer >60 >60 mL/min   GFR calc Af Amer >60 >60 mL/min   Anion gap 13 5 - 15    Comment: Performed at [facility], [address], [place], [place] [address]  Troponin I - Once     Status: None   Collection Time: 08/30/18  2:28 PM  Result Value Ref Range   Troponin I <0.03 <0.03 ng/mL    Comment: Performed at [facility], [address] [place], [place] [address]  CBC with Differential     Status: Abnormal   Collection Time: 08/30/18  2:28 PM  Result Value Ref Range   WBC 9.0 4.0 - 10.5 K/uL   RBC 3.80 (L) 4.22 - 5.81 MIL/uL     Hemoglobin 11.3 (L) 13.0 - 17.0 g/dL   HCT 35.2 (L) 39.0 - 52.0 %   MCV 92.6 80.0 - 100.0 fL   MCH 29.7 26.0 - 34.0 pg   MCHC 32.1 30.0 - 36.0 g/dL   RDW 12.7 11.5 - 15.5 %   Platelets 169 150 - 400 K/uL   nRBC 0.0 0.0 - 0.2 %   Neutrophils Relative % 62 %   Neutro Abs 5.6 1.7 - 7.7 K/uL   Lymphocytes Relative 27 %   Lymphs Abs 2.4 0.7 - 4.0 K/uL   Monocytes Relative 6 %   Monocytes Absolute 0.5 0.1 - 1.0 K/uL   Eosinophils Relative 4 %   Eosinophils Absolute 0.3 0.0 - 0.5 K/uL   Basophils Relative 0 %   Basophils Absolute 0.0 0.0 - 0.1 K/uL   Immature Granulocytes 1 %   Abs Immature Granulocytes 0.07 0.00 - 0.07 K/uL    Comment: Performed at [facility], [address] [place], [place] [address]  Urinalysis, Routine w reflex microscopic     Status: Abnormal   Collection Time: 08/30/18  4:38 PM  Result Value Ref Range   Color, Urine YELLOW YELLOW   APPearance HAZY (A) CLEAR   Specific Gravity, Urine 1.015 1.005 - 1.030   pH 5.0 5.0 - 8.0   Glucose, UA NEGATIVE NEGATIVE mg/dL   Hgb urine dipstick SMALL (A) NEGATIVE   Bilirubin Urine NEGATIVE NEGATIVE   Ketones, ur 20 (A) NEGATIVE mg/dL   Protein, ur NEGATIVE NEGATIVE mg/dL   Nitrite NEGATIVE NEGATIVE   Leukocytes, UA SMALL (A) NEGATIVE   RBC / HPF 11-20 0 - 5 RBC/hpf   WBC, UA 21-50 0 - 5 WBC/hpf   Bacteria, UA NONE SEEN NONE SEEN   Squamous Epithelial / LPF 0-5 0 - 5   Mucus PRESENT     Comment: Performed at [facility], [address], [place], [place] [address]  ABO/Rh     Status: None   Collection Time: 08/30/18  5:28 PM  Result Value Ref Range   ABO/RH(D)      A POS Performed at [facility], [address], [place], [place] [address]   CBC     Status: Abnormal   Collection Time: 08/30/18  5:33 PM  Result Value Ref Range   WBC 18.6 (H) 4.0 - 10.5 K/uL   RBC 3.51 (L) 4.22 - 5.81 MIL/uL   Hemoglobin 10.5 (L) 13.0 - 17.0 g/dL   HCT  33.3 (L) 39.0 - 52.0 %   MCV 94.9 80.0 - 100.0 fL   MCH 29.9 26.0 - 34.0 pg    MCHC 31.5 30.0 - 36.0 g/dL   RDW 12.6 11.5 - 15.5 %   Platelets 195 150 - 400 K/uL   nRBC 0.0 0.0 - 0.2 %    Comment: Performed at [facility], [address] [place], [place] [address]  Type and screen [facility]     Status: None (Preliminary result)   Collection Time: 08/30/18  5:33 PM  Result Value Ref Range   ABO/RH(D) A POS    Antibody Screen NEG    Sample Expiration 09/02/2018    Unit Number [id number]    Blood Component Type RED CELLS,LR    Unit division 00    Status of Unit ALLOCATED    Transfusion Status OK TO TRANSFUSE    Crossmatch Result Compatible    Unit Number [id number]    Blood Component Type RED CELLS,LR    Unit division 00    Status of Unit ALLOCATED    Transfusion Status OK TO TRANSFUSE    Crossmatch Result      Compatible Performed at [facility], [address], [place], [place] [address]   Lactic acid, plasma     Status: Abnormal   Collection Time: 08/30/18  5:33 PM  Result Value Ref Range   Lactic Acid, Venous 7.0 (HH) 0.5 - 1.9 mmol/L    Comment: CRITICAL RESULT CALLED TO, READ BACK BY AND VERIFIED WITH: MOORE,M ON 08/30/18 AT 1825 BY LOY,C Performed at [facility], [address], [place], [place] [address]   Troponin I - ONCE - STAT     Status: None   Collection Time: 08/30/18  5:33 PM  Result Value Ref Range   Troponin I <0.03 <0.03 ng/mL    Comment: Performed at [facility], [address] [place], [place] [address]  Prepare RBC     Status: None   Collection Time: 08/30/18  5:33 PM  Result Value Ref Range   Order Confirmation      ORDER PROCESSED BY BLOOD BANK Performed at [facility], [address], [place], [place] [address]   Type and screen [place]     Status: None (Preliminary result)   Collection Time: 08/30/18  9:19 PM  Result Value Ref Range   ABO/RH(D) A POS    Antibody Screen NEG    Sample Expiration      09/02/2018 Performed at [facility], [place] [address], [place], [place]  [address]    Unit Number [id number]    Blood Component Type RED CELLS,LR    Unit division 00    Status of Unit ISSUED    Transfusion Status OK TO TRANSFUSE    Crossmatch Result Compatible    Unit Number [id number]    Blood Component Type RED CELLS,LR    Unit division 00    Status of Unit ISSUED    Transfusion Status OK TO TRANSFUSE    Crossmatch Result Compatible    Unit Number [id number]    Blood Component Type RED CELLS,LR    Unit division 00    Status of Unit ALLOCATED    Transfusion Status OK TO TRANSFUSE    Crossmatch Result Compatible    Unit Number [id number]    Blood Component Type RED CELLS,LR    Unit division 00    Status of Unit ALLOCATED    Transfusion Status OK TO TRANSFUSE  Crossmatch Result Compatible   Prepare RBC     Status: None   Collection Time: 08/30/18  9:19 PM  Result Value Ref Range   Order Confirmation      ORDER PROCESSED BY BLOOD BANK Performed at [facility], [place] [address] [place], [place] [address]   ABO/Rh     Status: None   Collection Time: 08/30/18  9:19 PM  Result Value Ref Range   ABO/RH(D)      A POS Performed at [facility] [address], [place], [place] [address]   Prepare RBC     Status: None   Collection Time: 08/30/18 10:01 PM  Result Value Ref Range   Order Confirmation      ORDER PROCESSED BY BLOOD BANK Performed at [facility], [place] [address] [place], [place] [address]   MRSA PCR Screening     Status: None   Collection Time: 08/30/18 11:07 PM  Result Value Ref Range   MRSA by PCR NEGATIVE NEGATIVE    Comment:        The GeneXpert MRSA Assay (FDA approved for NASAL specimens only), is one component of a comprehensive MRSA colonization surveillance program. It is not intended to diagnose MRSA infection nor to guide or monitor treatment for MRSA infections. Performed at [facility], [place] [address], [place], [place] [address]   Glucose, capillary     Status: Abnormal    Collection Time: 08/30/18 11:10 PM  Result Value Ref Range   Glucose-Capillary 250 (H) 70 - 99 mg/dL  APTT     Status: None   Collection Time: 08/30/18 11:16 PM  Result Value Ref Range   aPTT 30 24 - 36 seconds    Comment: Performed at [facility] [address] [place], [place] [address]  Protime-INR     Status: Abnormal   Collection Time: 08/30/18 11:16 PM  Result Value Ref Range   Prothrombin Time 15.3 (H) 11.4 - 15.2 seconds   INR 1.23     Comment: Performed at [facility] [address], [place], [place] [address]  Glucose, capillary     Status: Abnormal   Collection Time: 08/31/18  3:15 AM  Result Value Ref Range   Glucose-Capillary 238 (H) 70 - 99 mg/dL  CBC     Status: Abnormal   Collection Time: 08/31/18  6:37 AM  Result Value Ref Range   WBC 12.1 (H) 4.0 - 10.5 K/uL   RBC 4.62 4.22 - 5.81 MIL/uL   Hemoglobin 13.4 13.0 - 17.0 g/dL   HCT 40.4 39.0 - 52.0 %   MCV 87.4 80.0 - 100.0 fL   MCH 29.0 26.0 - 34.0 pg   MCHC 33.2 30.0 - 36.0 g/dL   RDW 13.5 11.5 - 15.5 %   Platelets 135 (L) 150 - 400 K/uL   nRBC 0.0 0.0 - 0.2 %    Comment: Performed at [facility], [place]. [address] [place], [place] [address]  Comprehensive metabolic panel     Status: Abnormal   Collection Time: 08/31/18  6:37 AM  Result Value Ref Range   Sodium 140 135 - 145 mmol/L   Potassium 5.0 3.5 - 5.1 mmol/L   Chloride 106 98 - 111 mmol/L   CO2 21 (L) 22 - 32 mmol/L   Glucose, Bld 243 (H) 70 - 99 mg/dL   BUN 17 8 - 23 mg/dL   Creatinine, Ser 1.40 (H) 0.61 -  1.24 mg/dL   Calcium 8.3 (L) 8.9 - 10.3 mg/dL   Total Protein 5.0 (L) 6.5 - 8.1 g/dL   Albumin 2.8 (L) 3.5 - 5.0 g/dL   AST 43 (H) 15 - 41 U/L   ALT 18 0 - 44 U/L   Alkaline Phosphatase 50 38 - 126 U/L   Total Bilirubin 1.9 (H) 0.3 - 1.2 mg/dL   GFR calc non Af Amer 46 (L) >60 mL/min   GFR calc Af Amer 54 (L) >60 mL/min   Anion gap 13 5 - 15    Comment: Performed at [facility] [address], [place],  [place] [address]  TSH     Status: None   Collection Time: 08/31/18  6:37 AM  Result Value Ref Range   TSH 0.940 0.350 - 4.500 uIU/mL    Comment: Performed by a 3rd Generation assay with a functional sensitivity of <=0.01 uIU/mL. Performed at [facility], [place] [address], [place], [place] [address]   Prealbumin     Status: Abnormal   Collection Time: 08/31/18  6:37 AM  Result Value Ref Range   Prealbumin 14.7 (L) 18 - 38 mg/dL    Comment: Performed at [facility] [address], [place], [place] [address]  Lactic acid, plasma     Status: Abnormal   Collection Time: 08/31/18  6:37 AM  Result Value Ref Range   Lactic Acid, Venous 3.7 (HH) 0.5 - 1.9 mmol/L    Comment: CRITICAL RESULT CALLED TO, READ BACK BY AND VERIFIED WITHCheryl Flash RN [phone] 08/31/2018 BY A BENNETT Performed at [facility], [place] [address] [place], [place] [address]   Magnesium     Status: Abnormal   Collection Time: 08/31/18  6:37 AM  Result Value Ref Range   Magnesium 1.3 (L) 1.7 - 2.4 mg/dL    Comment: Performed at [facility] [address], [place], [place] [address]  Phosphorus     Status: None   Collection Time: 08/31/18  6:37 AM  Result Value Ref Range   Phosphorus 3.6 2.5 - 4.6 mg/dL    Comment: Performed at [facility] [address], [place], [place] [address]  Hemoglobin A1c     Status: Abnormal   Collection Time: 08/31/18  6:37 AM  Result Value Ref Range   Hgb A1c MFr Bld 6.6 (H) 4.8 - 5.6 %    Comment: (NOTE) Pre diabetes:          5.7%-6.4% Diabetes:              >6.4% Glycemic control for   <7.0% adults with diabetes    Mean Plasma Glucose 142.72 mg/dL    Comment: Performed at [facility] [address] [place], [place] [address]  Glucose, capillary     Status: Abnormal   Collection Time: 08/31/18  7:42 AM  Result Value Ref Range   Glucose-Capillary 211 (H) 70 - 99 mg/dL   Comment 1 Notify RN    Comment 2 Document in Chart   Prepare RBC     Status:  None   Collection Time: 08/31/18  7:50 AM  Result Value Ref Range   Order Confirmation      ORDER PROCESSED BY BLOOD BANK Performed at [facility], [place] [address], [place], [place] [address]     Dg Chest 1 View  Result Date: 08/30/2018 CLINICAL DATA:  Trauma secondary to a fall today.  Acute left femur fracture. EXAM: CHEST  1 VIEW COMPARISON:  Chest x-ray dated 04/05/2018 FINDINGS: The heart size and pulmonary vascularity are normal. Aortic atherosclerosis. The lungs are clear. No effusions. No acute bone abnormalities. IMPRESSION: No acute abnormalities. Aortic Atherosclerosis (ICD10-I70.0). Electronically Signed   By: Lorriane Shire M.D.   On: 08/30/2018 16:23   Dg Lumbar Spine 2-3 Views  Result Date: 08/30/2018 CLINICAL DATA:  Pain secondary to a fall today. Left femur fracture. EXAM: LUMBAR SPINE - 2-3 VIEW COMPARISON:  Radiographs dated 03/22/2017 and CT scan dated 12/26/2017 FINDINGS: There is a chronic lumbar scoliosis with convexity to the left centered at L2-3. There is no appreciable fracture. Multilevel degenerative disc disease. Osteophytes fuse the L1-2 and L2-3 levels. Prior CT scan demonstrates at the L5-S1 level is also fused. Aortic atherosclerosis. Moderately severe arthritis of the left hip joint. IMPRESSION: No acute abnormality of the lumbar spine. Degenerative changes as described. Electronically Signed   By: Lorriane Shire M.D.   On: 08/30/2018 16:26   Dg Knee 1-2 Views Left  Result Date: 08/30/2018 CLINICAL DATA:  Left hip fracture.  Left knee pain EXAM: LEFT KNEE - 1-2 VIEW COMPARISON:  None FINDINGS: Moderate tricompartment degenerative changes with joint space narrowing and spurring. Small joint effusion. No acute bony abnormality. Specifically, no fracture, subluxation, or dislocation. IMPRESSION: Moderate osteoarthritis with small joint effusion. No acute bony abnormality. Electronically Signed   By: Rolm Baptise M.D.   On: 08/30/2018 22:52   Dg Knee 1-2 Views  Right  Result Date: 08/30/2018 CLINICAL DATA:  Right knee pain secondary to a fall today. EXAM: RIGHT KNEE - 1-2 VIEW COMPARISON:  None FINDINGS: There is a comminuted impacted fracture of the distal right femoral shaft. There is a vertical component of the fracture which extends through the intercondylar notch. No dislocation. Diffuse osteopenia. Slight arthritic changes at the knee. IMPRESSION: Comminuted impacted distal right femur fracture extending through the intercondylar notch. Electronically Signed   By: Lorriane Shire M.D.   On: 08/30/2018 16:29   Ct Head Wo Contrast  Result Date: 08/30/2018 CLINICAL DATA:  Status post fall. EXAM: CT HEAD WITHOUT CONTRAST CT CERVICAL SPINE WITHOUT CONTRAST TECHNIQUE: Multidetector CT imaging of the head and cervical spine was performed following the standard protocol without intravenous contrast. Multiplanar CT image reconstructions of the cervical spine were also generated. COMPARISON:  Head CT 09/16/2005 FINDINGS: CT HEAD FINDINGS Brain: No evidence of acute infarction, hemorrhage, hydrocephalus, extra-axial collection or mass lesion/mass effect. Moderate brain parenchymal volume loss and deep white matter microangiopathy. Vascular: Calcific atherosclerotic disease. Skull: Normal. Negative for fracture or focal lesion. Sinuses/Orbits: No acute finding. Other: None. CT CERVICAL SPINE FINDINGS Alignment: Exaggerated cervical lordosis. Skull base and vertebrae: No acute fracture. No primary bone lesion or focal pathologic process. Soft tissues and spinal canal: No prevertebral fluid or swelling. No visible canal hematoma. Disc levels: Multilevel osteoarthritic changes with moderate posterior facet arthropathy. Upper chest: Negative. Other: None. IMPRESSION: No acute intracranial abnormality. Atrophy, chronic microvascular disease. No evidence of acute traumatic injury to the cervical spine. Multilevel osteoarthritic changes with exaggerated cervical lordosis.  Electronically Signed   By: Fidela Salisbury M.D.   On: 08/30/2018 16:59   Ct Cervical Spine Wo Contrast  Result Date: 08/30/2018 CLINICAL DATA:  Status post fall. EXAM: CT HEAD WITHOUT CONTRAST CT CERVICAL SPINE WITHOUT CONTRAST TECHNIQUE: Multidetector CT imaging of the head and cervical spine was performed following the standard protocol without intravenous contrast. Multiplanar CT image reconstructions of the  cervical spine were also generated. COMPARISON:  Head CT 09/16/2005 FINDINGS: CT HEAD FINDINGS Brain: No evidence of acute infarction, hemorrhage, hydrocephalus, extra-axial collection or mass lesion/mass effect. Moderate brain parenchymal volume loss and deep white matter microangiopathy. Vascular: Calcific atherosclerotic disease. Skull: Normal. Negative for fracture or focal lesion. Sinuses/Orbits: No acute finding. Other: None. CT CERVICAL SPINE FINDINGS Alignment: Exaggerated cervical lordosis. Skull base and vertebrae: No acute fracture. No primary bone lesion or focal pathologic process. Soft tissues and spinal canal: No prevertebral fluid or swelling. No visible canal hematoma. Disc levels: Multilevel osteoarthritic changes with moderate posterior facet arthropathy. Upper chest: Negative. Other: None. IMPRESSION: No acute intracranial abnormality. Atrophy, chronic microvascular disease. No evidence of acute traumatic injury to the cervical spine. Multilevel osteoarthritic changes with exaggerated cervical lordosis. Electronically Signed   By: Fidela Salisbury M.D.   On: 08/30/2018 16:59   Ct Knee Right Wo Contrast  Result Date: 08/30/2018 CLINICAL DATA:  Patient suffered a distal left femur fracture in a fall today. Initial encounter. EXAM: CT OF THE RIGHT KNEE WITHOUT CONTRAST TECHNIQUE: Multidetector CT imaging of the right knee was performed according to the standard protocol. Multiplanar CT image reconstructions were also generated. COMPARISON:  Plain films right knee earlier  today. FINDINGS: Bones/Joint/Cartilage As seen on the comparison plain films, the patient has a mildly comminuted and impacted fracture through the metaphysis and diaphysis of the right femur. There is impaction of up to approximately 3 cm and posterior displacement of the distal fragment approximately 1 cm. Longitudinal split component of the fracture extends through the central aspect of the femoral trochlea with mild distraction anteriorly of 0.5 cm. Cortical irregularity along the periphery of the medial patellar facet may be due to a mild impaction fracture. No other fracture is identified. Bones are osteopenic. Degenerative change is present about the knee with some joint space narrowing and mild osteophytosis. Ligaments Suboptimally assessed by CT. The cruciate and collateral ligaments appear intact. Muscles and Tendons Intact. Soft tissues Hematoma is seen tracking superficial to the gastrocnemius musculature. The hematoma measures approximately 5 cm transverse by 1.5 cm AP by 7 cm craniocaudal. IMPRESSION: Mildly comminuted and impacted fracture through the diaphysis and metaphysis of the distal right femur includes a longitudinal component through the central aspect of femoral trochlea. Findings compatible with a mild impaction fracture through the periphery of the medial patellar facet. The fracture is incomplete. Ligamentous structures appear intact on CT scan. Hematoma in the posterior aspect of the calf. Electronically Signed   By: Inge Rise M.D.   On: 08/30/2018 19:48   Ct Abdomen Pelvis W Contrast  Result Date: 08/30/2018 CLINICAL DATA:  Hypotension today. Altered mental status. Status post fall today. EXAM: CT ABDOMEN AND PELVIS WITH CONTRAST TECHNIQUE: Multidetector CT imaging of the abdomen and pelvis was performed using the standard protocol following bolus administration of intravenous contrast. CONTRAST:  100 mL OMNIPAQUE IOHEXOL 300 MG/ML  SOLN COMPARISON:  CT abdomen and pelvis  12/26/2017. Plain films left hip earlier today. FINDINGS: Lower chest: Mild dependent atelectasis is seen. Punctate calcified granuloma left lower lobe is identified. Hepatobiliary: No focal liver abnormality is seen. Status post cholecystectomy. No biliary dilatation. Pancreas: Unremarkable. No pancreatic ductal dilatation or surrounding inflammatory changes. Spleen: Calcifications in the spleen consistent with old granulomatous disease noted. Spleen is normal in size. Adrenals/Urinary Tract: Single bilateral renal cysts are unchanged. The kidneys otherwise appear normal. No hydronephrosis or solid renal lesion. Urinary bladder is unremarkable. The adrenal glands appear normal. Stomach/Bowel: The  patient is status post resection of the sigmoid colon with a left lower quadrant ostomy and associated large parastomal hernia, unchanged. The ascending colon is under distended but otherwise unremarkable. Scattered diverticulosis without diverticulitis is identified. No evidence of small bowel obstruction. The stomach is unremarkable. Vascular/Lymphatic: Atherosclerotic vascular disease noted. No aneurysm. No lymphadenopathy. Reproductive: Prostate gland is mildly enlarged, unchanged. Other: None. Musculoskeletal: Proximal left femur fracture is identified as seen on prior plain films. There is hematoma about the patient's fracture. No active extravasation of contrast is seen. The hematoma is incompletely visualized but is of approximately IMPRESSION: No acute abnormality abdomen or pelvis. Status post sigmoid colectomy. 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 Proximal left femur fracture with surrounding hematoma which is incompletely visualized. Atherosclerosis. Electronically Signed   By: Inge Rise M.D.   On: 08/30/2018 19:28   Dg Hips Bilat W Or Wo Pelvis 5 Views  Result Date: 08/30/2018 CLINICAL DATA:  Hip pain and right knee pain secondary to a fall today. EXAM: DG HIP (WITH OR  WITHOUT PELVIS) 5+V BILAT COMPARISON:  None. FINDINGS: There is an angulated displaced overriding spiral fracture of the proximal left femoral shaft. The fracture extends into the anterior or posterior cortex of the intertrochanteric region without definitive involvement of the greater and lesser trochanters. There is moderate arthritis of the left hip joint with joint space narrowing and a slight protrusio deformity. No discrete pelvic fractures. Diffuse osteopenia. Minimal arthritic changes of the right hip. IMPRESSION: Displaced angulated overriding fracture of the proximal left femoral shaft as described. Electronically Signed   By: Lorriane Shire M.D.   On: 08/30/2018 16:16    Review of Systems  Respiratory: Negative for shortness of breath.   Cardiovascular: Negative for chest pain and palpitations.  Gastrointestinal: Negative for abdominal pain and vomiting.  Neurological: Negative for tingling and sensory change.   Blood pressure (!) 118/49, pulse 86, temperature (!) 100.5 F (38.1 C), temperature source Oral, resp. rate 11, height 5\' 7"  (1.702 m), weight 73.5 kg, SpO2 100 %. Physical Exam Vitals signs and nursing note reviewed.  Constitutional:      General: He is not in acute distress.    Comments: Appropriately appearing 85 year old white male, very pleasant  Cardiovascular:     Rate and Rhythm: Normal rate and regular rhythm.     Heart sounds: S1 normal and S2 normal.  Pulmonary:     Effort: Pulmonary effort is normal.     Comments: Clear anterior fields Abdominal:     Palpations: Abdomen is soft.     Comments: Colostomy left lower quadrant  Musculoskeletal:     Comments: Pelvis--no traumatic wounds or rash, no ecchymosis, stable to manual stress, nontender  Left lower extremity Inspection: Left leg is shortened and externally rotated No open wounds or lesions appreciated No significant swelling Patient is in 10 pounds of Buck's traction however it is ill fitting  Bony  eval: Tender to palpation left hip Thigh, knee, lower leg and foot are nontender Mild tenderness with palpation of his medial malleolus  Soft tissue: No open wounds or lesions to the left hip.  Mild swelling to left hip No knee effusion No ankle swelling or ankle effusion Dystrophic toenails Dystrophic skin changes noted bilaterally as well  Sensation: DPN, SPN, TN sensory functions are grossly intact Motor: EHL, FHL, anterior tibialis, posterior tibialis, peroneals and gastrocsoleus complex motor functions are intact  Vascular: + DP pulse Extremity is warm Thigh is soft Compartments are  soft No pain with passive stretching  Right lower extremity Inspection: Knee immobilizer is in place and is fitting well No open wounds or lesions noted No significant swelling to the right leg  Bony eval: Hip is nontender Distal femur is tender Mild tenderness palpation of his knee and patella Ankle is nontender, lower leg nontender Foot is nontender  Soft tissue: No significant swelling noted about the right hip Again no open wounds or lesions no traumatic wounds Moderate to large knee effusion No significant swelling distal leg No ankle swelling or ankle effusion Dystrophic skin changes and nail changes noted  Sensation: DPN, SPN, TN sensory functions are grossly intact Motor: EHL, FHL, anterior tibialis, posterior tibialis, peroneals and gastrocsoleus complex motor functions are intact  Vascular: + DP pulse Extremity is warm Thigh is soft Compartments are soft No pain with passive stretching  Bilateral upper extremities UEx shoulder, elbow, wrist, digits-no traumatic wounds wounds, nontender, no instability, no blocks to motion             No crepitus with manipulation of his upper extremities  Sens  Ax/R/M/U intact  Mot   Ax/ R/ PIN/ M/ AIN/ U intact  Rad 2+   Skin:    General: Skin is warm.  Neurological:     Mental Status: He is alert and oriented to person,  place, and time.  Psychiatric:        Attention and Perception: Attention normal.        Mood and Affect: Mood normal.        Behavior: Behavior is cooperative.            Gait:  Not assessed Coordination and balance:  Not assessed   Assessment/Plan:  85 year old male with history of rectal cancer status post colostomy, diabetes s/p ground-level fall with highly comminuted impacted right intra-articular distal femur fracture and left peritrochanteric hip fracture  -Multiple orthopedic injuries from ground-level fall  Right intra-articular distal femur fracture  Left peritrochanteric hip fracture    Patient will require surgical intervention to address his injuries as they are both unstable injuries.  Would strongly advocate surgery in an effort to give the patient the best opportunity to return to baseline function.   Patient is a walker dependent at baseline and unfortunately may become wheelchair dependent because of these injuries.   We are hopeful to allow him to mobilize somewhat after surgical fixation of his injuries however his bone quality is so poor that we will be unable to accelerate his weightbearing too soon otherwise there may be a catastrophic failure.  Will try her best to balance immobilization with weightbearing restrictions in an effort to minimize his potential for deconditioning.    Patient is at increased risk for perioperative complications due to his medical comorbidities as well as bilateral nature of his injuries.    We are hopeful to proceed with surgery later today however may be delayed until Wednesday depending on OR availability.    Patient will not have any range of motion restrictions postoperatively.    I did remove his Buck's traction today as it was ill fitting and felt that was compromising his skin more and not providing any relief as well.    Continue to roll as needed to maintain decubitus precautions  - Pain management:  Titrate  accordingly minimize narcotics as able although pain management may be difficult as he is on chronic tramadol therapy and has been on for several years  - ABL anemia/Hemodynamics  Has  responded very nicely to 2 units of packed red cells as well as albumin.  Continue to monitor his CBC.  2 units have been typed and crossed in preparation for the OR  - Medical issues   Per trauma service  - DVT/PE prophylaxis:  SCDs  Lovenox postop when able - ID:   Perioperative antibiotics  - Metabolic Bone Disease:  + Osteoporosis given to low energy femur fractures  Will need DEXA scan as an outpatient to facilitate treatment options  Increase vitamin D and add vitamin C  Metabolic bone labs are pending   Patient may be a candidate for Forteo or even entity.  Would advocate anabolic agent at this point as it is bone quality looks very poor as evidenced by his 2 low energy femur fractures.  After completion of anabolic therapy would then transition to Prolia  - Activity:  Bedrest for now  Will determine weightbearing status postoperatively  - FEN/GI prophylaxis/Foley/Lines:  NPO  - Impediments to fracture healing:  Poor bone density/quality  Diabetes   - Dispo:  Possible for OR later this afternoon for IM nailing of left hip and ORIF right distal femur.  This may be delayed until Wednesday depending on or availability today    Jari Pigg, PA-C [phone] (C) 08/31/2018, 9:17 AM  [facility] [place] [id number] [phone] (F)

## 2018-08-31 NOTE — Progress Notes (Addendum)
Patient ID: Ralph Yang, male   DOB: 1934/02/09, 85 y.o.   MRN: [id number]    Subjective: C/O pain L upper thigh BP stable overnight  Objective: Vital signs in last 24 hours: Temp:  [97.4 F (36.3 C)-100 F (37.8 C)] 98.1 F (36.7 C) (02/11 0400) Pulse Rate:  [81-116] 86 (02/11 0700) Resp:  [11-20] 11 (02/11 0700) BP: (51-135)/(27-77) 118/49 (02/11 0700) SpO2:  [86 %-100 %] 100 % (02/11 0700) Weight:  [73.5 kg] 73.5 kg (02/10 1339)    Intake/Output from previous day: 02/10 0701 - 02/11 0700 In: 1519.3 [I.V.:259.3; Blood:1260] Out: -  Intake/Output this shift: No intake/output data recorded.  General appearance: alert and cooperative Resp: clear to auscultation bilaterally Cardio: regular rate and rhythm GI: soft, NT, colostomy LLQ with redicible peristomal hernia Extremities: KI RLE, buck's LLE, feet perfused Neuro: oriented and F/C  Lab Results: CBC  Recent Labs    08/30/18 1733 08/31/18 0637  WBC 18.6* 12.1*  HGB 10.5* 13.4  HCT 33.3* 40.4  PLT 195 135*   BMET Recent Labs    08/30/18 1428  NA 137  K 4.4  CL 103  CO2 21*  GLUCOSE 173*  BUN 14  CREATININE 0.91  CALCIUM 8.8*   PT/INR Recent Labs    08/30/18 2316  LABPROT 15.3*  INR 1.23   ABG No results for input(s): PHART, HCO3 in the last 72 hours.  Invalid input(s): PCO2, PO2  Studies/Results: Dg Chest 1 View  Result Date: 08/30/2018 CLINICAL DATA:  Trauma secondary to a fall today. Acute left femur fracture. EXAM: CHEST  1 VIEW COMPARISON:  Chest x-ray dated 04/05/2018 FINDINGS: The heart size and pulmonary vascularity are normal. Aortic atherosclerosis. The lungs are clear. No effusions. No acute bone abnormalities. IMPRESSION: No acute abnormalities. Aortic Atherosclerosis (ICD10-I70.0). Electronically Signed   By: Lorriane Shire M.D.   On: 08/30/2018 16:23   Dg Lumbar Spine 2-3 Views  Result Date: 08/30/2018 CLINICAL DATA:  Pain secondary to a fall today. Left femur fracture. EXAM:  LUMBAR SPINE - 2-3 VIEW COMPARISON:  Radiographs dated 03/22/2017 and CT scan dated 12/26/2017 FINDINGS: There is a chronic lumbar scoliosis with convexity to the left centered at L2-3. There is no appreciable fracture. Multilevel degenerative disc disease. Osteophytes fuse the L1-2 and L2-3 levels. Prior CT scan demonstrates at the L5-S1 level is also fused. Aortic atherosclerosis. Moderately severe arthritis of the left hip joint. IMPRESSION: No acute abnormality of the lumbar spine. Degenerative changes as described. Electronically Signed   By: Lorriane Shire M.D.   On: 08/30/2018 16:26   Dg Knee 1-2 Views Left  Result Date: 08/30/2018 CLINICAL DATA:  Left hip fracture.  Left knee pain EXAM: LEFT KNEE - 1-2 VIEW COMPARISON:  None FINDINGS: Moderate tricompartment degenerative changes with joint space narrowing and spurring. Small joint effusion. No acute bony abnormality. Specifically, no fracture, subluxation, or dislocation. IMPRESSION: Moderate osteoarthritis with small joint effusion. No acute bony abnormality. Electronically Signed   By: Rolm Baptise M.D.   On: 08/30/2018 22:52   Dg Knee 1-2 Views Right  Result Date: 08/30/2018 CLINICAL DATA:  Right knee pain secondary to a fall today. EXAM: RIGHT KNEE - 1-2 VIEW COMPARISON:  None FINDINGS: There is a comminuted impacted fracture of the distal right femoral shaft. There is a vertical component of the fracture which extends through the intercondylar notch. No dislocation. Diffuse osteopenia. Slight arthritic changes at the knee. IMPRESSION: Comminuted impacted distal right femur fracture extending through the intercondylar notch.  Electronically Signed   By: Lorriane Shire M.D.   On: 08/30/2018 16:29   Ct Head Wo Contrast  Result Date: 08/30/2018 CLINICAL DATA:  Status post fall. EXAM: CT HEAD WITHOUT CONTRAST CT CERVICAL SPINE WITHOUT CONTRAST TECHNIQUE: Multidetector CT imaging of the head and cervical spine was performed following the standard  protocol without intravenous contrast. Multiplanar CT image reconstructions of the cervical spine were also generated. COMPARISON:  Head CT 09/16/2005 FINDINGS: CT HEAD FINDINGS Brain: No evidence of acute infarction, hemorrhage, hydrocephalus, extra-axial collection or mass lesion/mass effect. Moderate brain parenchymal volume loss and deep white matter microangiopathy. Vascular: Calcific atherosclerotic disease. Skull: Normal. Negative for fracture or focal lesion. Sinuses/Orbits: No acute finding. Other: None. CT CERVICAL SPINE FINDINGS Alignment: Exaggerated cervical lordosis. Skull base and vertebrae: No acute fracture. No primary bone lesion or focal pathologic process. Soft tissues and spinal canal: No prevertebral fluid or swelling. No visible canal hematoma. Disc levels: Multilevel osteoarthritic changes with moderate posterior facet arthropathy. Upper chest: Negative. Other: None. IMPRESSION: No acute intracranial abnormality. Atrophy, chronic microvascular disease. No evidence of acute traumatic injury to the cervical spine. Multilevel osteoarthritic changes with exaggerated cervical lordosis. Electronically Signed   By: Fidela Salisbury M.D.   On: 08/30/2018 16:59   Ct Cervical Spine Wo Contrast  Result Date: 08/30/2018 CLINICAL DATA:  Status post fall. EXAM: CT HEAD WITHOUT CONTRAST CT CERVICAL SPINE WITHOUT CONTRAST TECHNIQUE: Multidetector CT imaging of the head and cervical spine was performed following the standard protocol without intravenous contrast. Multiplanar CT image reconstructions of the cervical spine were also generated. COMPARISON:  Head CT 09/16/2005 FINDINGS: CT HEAD FINDINGS Brain: No evidence of acute infarction, hemorrhage, hydrocephalus, extra-axial collection or mass lesion/mass effect. Moderate brain parenchymal volume loss and deep white matter microangiopathy. Vascular: Calcific atherosclerotic disease. Skull: Normal. Negative for fracture or focal lesion.  Sinuses/Orbits: No acute finding. Other: None. CT CERVICAL SPINE FINDINGS Alignment: Exaggerated cervical lordosis. Skull base and vertebrae: No acute fracture. No primary bone lesion or focal pathologic process. Soft tissues and spinal canal: No prevertebral fluid or swelling. No visible canal hematoma. Disc levels: Multilevel osteoarthritic changes with moderate posterior facet arthropathy. Upper chest: Negative. Other: None. IMPRESSION: No acute intracranial abnormality. Atrophy, chronic microvascular disease. No evidence of acute traumatic injury to the cervical spine. Multilevel osteoarthritic changes with exaggerated cervical lordosis. Electronically Signed   By: Fidela Salisbury M.D.   On: 08/30/2018 16:59   Ct Knee Right Wo Contrast  Result Date: 08/30/2018 CLINICAL DATA:  Patient suffered a distal left femur fracture in a fall today. Initial encounter. EXAM: CT OF THE RIGHT KNEE WITHOUT CONTRAST TECHNIQUE: Multidetector CT imaging of the right knee was performed according to the standard protocol. Multiplanar CT image reconstructions were also generated. COMPARISON:  Plain films right knee earlier today. FINDINGS: Bones/Joint/Cartilage As seen on the comparison plain films, the patient has a mildly comminuted and impacted fracture through the metaphysis and diaphysis of the right femur. There is impaction of up to approximately 3 cm and posterior displacement of the distal fragment approximately 1 cm. Longitudinal split component of the fracture extends through the central aspect of the femoral trochlea with mild distraction anteriorly of 0.5 cm. Cortical irregularity along the periphery of the medial patellar facet may be due to a mild impaction fracture. No other fracture is identified. Bones are osteopenic. Degenerative change is present about the knee with some joint space narrowing and mild osteophytosis. Ligaments Suboptimally assessed by CT. The cruciate and collateral ligaments appear  intact.  Muscles and Tendons Intact. Soft tissues Hematoma is seen tracking superficial to the gastrocnemius musculature. The hematoma measures approximately 5 cm transverse by 1.5 cm AP by 7 cm craniocaudal. IMPRESSION: Mildly comminuted and impacted fracture through the diaphysis and metaphysis of the distal right femur includes a longitudinal component through the central aspect of femoral trochlea. Findings compatible with a mild impaction fracture through the periphery of the medial patellar facet. The fracture is incomplete. Ligamentous structures appear intact on CT scan. Hematoma in the posterior aspect of the calf. Electronically Signed   By: Inge Rise M.D.   On: 08/30/2018 19:48   Ct Abdomen Pelvis W Contrast  Result Date: 08/30/2018 CLINICAL DATA:  Hypotension today. Altered mental status. Status post fall today. EXAM: CT ABDOMEN AND PELVIS WITH CONTRAST TECHNIQUE: Multidetector CT imaging of the abdomen and pelvis was performed using the standard protocol following bolus administration of intravenous contrast. CONTRAST:  100 mL OMNIPAQUE IOHEXOL 300 MG/ML  SOLN COMPARISON:  CT abdomen and pelvis 12/26/2017. Plain films left hip earlier today. FINDINGS: Lower chest: Mild dependent atelectasis is seen. Punctate calcified granuloma left lower lobe is identified. Hepatobiliary: No focal liver abnormality is seen. Status post cholecystectomy. No biliary dilatation. Pancreas: Unremarkable. No pancreatic ductal dilatation or surrounding inflammatory changes. Spleen: Calcifications in the spleen consistent with old granulomatous disease noted. Spleen is normal in size. Adrenals/Urinary Tract: Single bilateral renal cysts are unchanged. The kidneys otherwise appear normal. No hydronephrosis or solid renal lesion. Urinary bladder is unremarkable. The adrenal glands appear normal. Stomach/Bowel: The patient is status post resection of the sigmoid colon with a left lower quadrant ostomy and associated large  parastomal hernia, unchanged. The ascending colon is under distended but otherwise unremarkable. Scattered diverticulosis without diverticulitis is identified. No evidence of small bowel obstruction. The stomach is unremarkable. Vascular/Lymphatic: Atherosclerotic vascular disease noted. No aneurysm. No lymphadenopathy. Reproductive: Prostate gland is mildly enlarged, unchanged. Other: None. Musculoskeletal: Proximal left femur fracture is identified as seen on prior plain films. There is hematoma about the patient's fracture. No active extravasation of contrast is seen. The hematoma is incompletely visualized but is of approximately IMPRESSION: No acute abnormality abdomen or pelvis. Status post sigmoid colectomy. 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 Proximal left femur fracture with surrounding hematoma which is incompletely visualized. Atherosclerosis. Electronically Signed   By: Inge Rise M.D.   On: 08/30/2018 19:28   Dg Hips Bilat W Or Wo Pelvis 5 Views  Result Date: 08/30/2018 CLINICAL DATA:  Hip pain and right knee pain secondary to a fall today. EXAM: DG HIP (WITH OR WITHOUT PELVIS) 5+V BILAT COMPARISON:  None. FINDINGS: There is an angulated displaced overriding spiral fracture of the proximal left femoral shaft. The fracture extends into the anterior or posterior cortex of the intertrochanteric region without definitive involvement of the greater and lesser trochanters. There is moderate arthritis of the left hip joint with joint space narrowing and a slight protrusio deformity. No discrete pelvic fractures. Diffuse osteopenia. Minimal arthritic changes of the right hip. IMPRESSION: Displaced angulated overriding fracture of the proximal left femoral shaft as described. Electronically Signed   By: Lorriane Shire M.D.   On: 08/30/2018 16:16    Anti-infectives: Anti-infectives (From admission, onward)   Start     Dose/Rate Route Frequency Ordered Stop   08/31/18  1330  ceFAZolin (ANCEF) IVPB 2g/100 mL premix     2 g 200 mL/hr over 30 Minutes Intravenous To [facility] 08/30/18 2308 09/01/18  1330      Assessment/Plan: Fall R distal femur FX - KI, for ORIF today by Dr. Marcelino Scot, metabolic bone work-up L proximal femur FX - for IM nail today by Dr. Marcelino Scot ABL anemia - Hb up to 13, 2u available for OR CV - hypotension related to blood loss has resolved after transfusion AKI - continue fluids, albumin bolus as below, F/U Elevated lactate - albumin bolus now DM - SSI HX colon cancer with colostomy and chronic peristomal hernia FEN - NPO for OR, CMET pending VTE - no Lovenox yet Dispo - ICU, OR  LOS: 1 day    Georganna Skeans, MD, MPH, FACS Trauma: [phone] General Surgery: [phone]  08/31/2018

## 2018-08-31 NOTE — NC FL2 (Signed)
[place] MEDICAID FL2 LEVEL OF CARE SCREENING TOOL     IDENTIFICATION  Patient Name: RAJIV PARLATO Birthdate: 1933-10-11 Sex: male Admission Date (Current Location): 08/30/2018  [facility] and [place] Number:  Herbalist and Address:  [facility] [place], [place] [address] [place], [place] [address]      Provider Number: [id number]  Attending Physician Name and Address:  Md, Trauma, MD  Relative Name and Phone Number:  Kingdavid Leinbach, spouse, [phone]    Current Level of Care: Hospital Recommended Level of Care: [place] Prior Approval Number:    Date Approved/Denied:   PASRR Number: [id number]  Discharge Plan: SNF    Current Diagnoses: Patient Active Problem List   Diagnosis Date Noted  . Femur fracture, left ([place]) 08/30/2018  . Hyperlipidemia associated with type 2 diabetes mellitus ([place]) 12/31/2017  . Back pain 12/30/2017  . Failure to thrive in adult 12/26/2017  . Acute bilateral low back pain without sciatica 12/26/2017  . Hypertension   . Diabetes mellitus without complication ([place])   . Arthritis   . Cancer ([place])     Orientation RESPIRATION BLADDER Height & Weight     Self, Time, Situation, Place  O2(nasal cannula 2L) Continent, Indwelling catheter Weight: 73.5 kg Height:  5\' 7"  (170.2 cm)  BEHAVIORAL SYMPTOMS/MOOD NEUROLOGICAL BOWEL NUTRITION STATUS      Colostomy(uses colostomy chronically at home) Diet(please see DC summary)  AMBULATORY STATUS COMMUNICATION OF NEEDS Skin   Extensive Assist Verbally Normal                       Personal Care Assistance Level of Assistance  Bathing, Feeding, Dressing Bathing Assistance: Limited assistance Feeding assistance: Independent Dressing Assistance: Limited assistance     Functional Limitations Info  Sight, Hearing, Speech Sight Info: Adequate Hearing Info: Adequate Speech Info: Adequate    SPECIAL CARE FACTORS FREQUENCY  PT (By licensed PT), OT (By  licensed OT)     PT Frequency: 5x/week OT Frequency: 5x/week            Contractures Contractures Info: Not present    Additional Factors Info  Code Status, Allergies, Insulin Sliding Scale Code Status Info: Full Allergies Info: Sulfa Antibiotics   Insulin Sliding Scale Info: novolog Q4 hours       Current Medications (08/31/2018):  This is the current hospital active medication list Current Facility-Administered Medications  Medication Dose Route Frequency Provider Last Rate Last Dose  . 0.9 %  sodium chloride infusion (Manually program via Guardrails IV Fluids)   Intravenous Once Georganna Skeans, MD      . acetaminophen (OFIRMEV) IV 1,000 mg  1,000 mg Intravenous Q6H Ainsley Spinner, PA-C 400 mL/hr at 08/31/18 1200    . ceFAZolin (ANCEF) IVPB 2g/100 mL premix  2 g Intravenous To Kurtis Bushman, MD      . chlorhexidine (HIBICLENS) 4 % liquid 4 application  60 mL Topical Once Rolm Bookbinder, MD      . dextrose 5 %-0.9 % sodium chloride infusion   Intravenous Continuous Georganna Skeans, MD 50 mL/hr at 08/31/18 1549    . HYDROmorphone (DILAUDID) injection 1 mg  1 mg Intravenous Q2H PRN Cornett, Marcello Moores, MD   1 mg at 08/31/18 1406  . insulin aspart (novoLOG) injection 0-15 Units  0-15 Units Subcutaneous Q4H Erroll Luna, MD   3 Units at 08/31/18 1157  . ondansetron (ZOFRAN-ODT) disintegrating tablet 4 mg  4 mg Oral Q6H PRN Cornett, Marcello Moores,  MD       Or  . ondansetron (ZOFRAN) injection 4 mg  4 mg Intravenous Q6H PRN Cornett, Thomas, MD      . povidone-iodine 10 % swab 2 application  2 application Topical Once Milly Jakob, MD      . sodium chloride 0.9 % bolus 1,000 mL  1,000 mL Intravenous Once Cornett, Thomas, MD      . tamsulosin (FLOMAX) capsule 0.4 mg  0.4 mg Oral Daily Cornett, Marcello Moores, MD         Discharge Medications: Please see discharge summary for a list of discharge medications.  Relevant Imaging Results:  Relevant Lab Results:   Additional  Information SSN: [id number]  Estanislado Emms, LCSW

## 2018-08-31 NOTE — Care Management Note (Signed)
Case Management Note  Patient Details  Name: Ralph Yang MRN: [id number] Date of Birth: 1933-10-15  Subjective/Objective:   Pt admitted on 08/30/2018 s/p ground level fall with bilateral femur fx.  PTA, pt resided at home with spouse; ambulates with cane or walker.                      Action/Plan: Awaiting ortho surgery.  Will follow for discharge planning as pt progresses.  Expected Discharge Date:                  Expected Discharge Plan:  Skilled Nursing Facility  In-House Referral:  Clinical Social Work  Discharge planning Services  CM Consult  Post Acute Care Choice:    Choice offered to:     DME Arranged:    DME Agency:     HH Arranged:    [place] Agency:     Status of Service:  In process, will continue to follow  If discussed at Long Length of Stay Meetings, dates discussed:    Additional Comments:  Reinaldo Raddle, RN, BSN  Trauma/Neuro ICU Case Manager [phone]

## 2018-08-31 NOTE — Consult Note (Signed)
Orthopaedic Trauma Service (OTS) Consult   Patient ID: BRYDEN DARDEN MRN: [id number] DOB/AGE: 03/22/1934 85 y.o.   Reason for Consult: Ground-level fall with left hip fracture and right distal femur fracture Referring Physician: D. Grandville Silos, MD (Ortho)   HPI: Aydien Majette Sean is an 85 y.o.white male with history notable for rectal cancer status post colostomy, and diabetes and hypertension, chronic back pain on chronic tramadol treatment who presented to [facility] after sustaining a ground-level fall while at home.  Patient was found to have left hip fracture and right distal femur fracture.  Patient became hypotensive and due to the constellation of his injuries was transferred to [facility] hospital for further treatment.  Patient was admitted to the trauma service with orthopedic trauma service consultation.  Patient was transfused with 2 units of packed red blood cells on admission to [facility] hospital yesterday evening he is responded very nicely overnight.  He is having moderate pain but tolerable with pain medicine.  Patient lives at home with his wife and son  He uses a walker at baseline   Patient denies any numbness or tingling in his lower extremities.  Denies any injuries to his upper extremities   Lactic acid on admission was 7.0 this is trended down to 3.6  Past Medical History:  Diagnosis Date  . Arthritis   . Cancer ([place])    rectal  . Diabetes mellitus without complication ([place])   . Hypertension     Past Surgical History:  Procedure Laterality Date  . BACK SURGERY    . CHOLECYSTECTOMY    . COLOSTOMY    . COLOSTOMY      No family history on file.  Social History:  reports that he has never smoked. He has never used smokeless tobacco. He reports that he does not drink alcohol or use drugs.  Allergies:  Allergies  Allergen Reactions  . Sulfa Antibiotics Nausea Only    Medications: I have reviewed the patient's current medications. Current  Meds  Medication Sig  . acetaminophen (TYLENOL) 325 MG tablet Take 650 mg by mouth 2 (two) times daily.  . Cyanocobalamin (VITAMIN B 12 PO) Take 500 mcg by mouth daily. GUMMIES  . lisinopril (PRINIVIL,ZESTRIL) 5 MG tablet Take 5 mg by mouth daily.  . metFORMIN (GLUCOPHAGE) 500 MG tablet Take 500 mg by mouth 2 (two) times daily with a meal.   . tamsulosin (FLOMAX) 0.4 MG CAPS capsule Take 1 capsule (0.4 mg total) by mouth daily. (Patient taking differently: Take 0.8 mg by mouth daily. )  . traMADol (ULTRAM) 50 MG tablet Take 50 mg by mouth every 4 (four) hours as needed for moderate pain.   . Vitamin D, Cholecalciferol, 1000 UNITS TABS Take 1,000 Units by mouth every morning.      Results for orders placed or performed during the hospital encounter of 08/30/18 (from the past 48 hour(s))  Comprehensive metabolic panel     Status: Abnormal   Collection Time: 08/30/18  2:28 PM  Result Value Ref Range   Sodium 137 135 - 145 mmol/L   Potassium 4.4 3.5 - 5.1 mmol/L   Chloride 103 98 - 111 mmol/L   CO2 21 (L) 22 - 32 mmol/L   Glucose, Bld 173 (H) 70 - 99 mg/dL   BUN 14 8 - 23 mg/dL   Creatinine, Ser 0.91 0.61 - 1.24 mg/dL   Calcium 8.8 (L) 8.9 - 10.3 mg/dL   Total Protein 6.1 (L) 6.5 -  8.1 g/dL   Albumin 3.3 (L) 3.5 - 5.0 g/dL   AST 22 15 - 41 U/L   ALT 19 0 - 44 U/L   Alkaline Phosphatase 65 38 - 126 U/L   Total Bilirubin 0.5 0.3 - 1.2 mg/dL   GFR calc non Af Amer >60 >60 mL/min   GFR calc Af Amer >60 >60 mL/min   Anion gap 13 5 - 15    Comment: Performed at [facility], [address] [place], [place] [address]  Troponin I - Once     Status: None   Collection Time: 08/30/18  2:28 PM  Result Value Ref Range   Troponin I <0.03 <0.03 ng/mL    Comment: Performed at [facility], [address] [place], [place] [address]  CBC with Differential     Status: Abnormal   Collection Time: 08/30/18  2:28 PM  Result Value Ref Range   WBC 9.0 4.0 - 10.5 K/uL   RBC 3.80 (L) 4.22 - 5.81 MIL/uL     Hemoglobin 11.3 (L) 13.0 - 17.0 g/dL   HCT 35.2 (L) 39.0 - 52.0 %   MCV 92.6 80.0 - 100.0 fL   MCH 29.7 26.0 - 34.0 pg   MCHC 32.1 30.0 - 36.0 g/dL   RDW 12.7 11.5 - 15.5 %   Platelets 169 150 - 400 K/uL   nRBC 0.0 0.0 - 0.2 %   Neutrophils Relative % 62 %   Neutro Abs 5.6 1.7 - 7.7 K/uL   Lymphocytes Relative 27 %   Lymphs Abs 2.4 0.7 - 4.0 K/uL   Monocytes Relative 6 %   Monocytes Absolute 0.5 0.1 - 1.0 K/uL   Eosinophils Relative 4 %   Eosinophils Absolute 0.3 0.0 - 0.5 K/uL   Basophils Relative 0 %   Basophils Absolute 0.0 0.0 - 0.1 K/uL   Immature Granulocytes 1 %   Abs Immature Granulocytes 0.07 0.00 - 0.07 K/uL    Comment: Performed at [facility], [address] [place], [place] [address]  Urinalysis, Routine w reflex microscopic     Status: Abnormal   Collection Time: 08/30/18  4:38 PM  Result Value Ref Range   Color, Urine YELLOW YELLOW   APPearance HAZY (A) CLEAR   Specific Gravity, Urine 1.015 1.005 - 1.030   pH 5.0 5.0 - 8.0   Glucose, UA NEGATIVE NEGATIVE mg/dL   Hgb urine dipstick SMALL (A) NEGATIVE   Bilirubin Urine NEGATIVE NEGATIVE   Ketones, ur 20 (A) NEGATIVE mg/dL   Protein, ur NEGATIVE NEGATIVE mg/dL   Nitrite NEGATIVE NEGATIVE   Leukocytes, UA SMALL (A) NEGATIVE   RBC / HPF 11-20 0 - 5 RBC/hpf   WBC, UA 21-50 0 - 5 WBC/hpf   Bacteria, UA NONE SEEN NONE SEEN   Squamous Epithelial / LPF 0-5 0 - 5   Mucus PRESENT     Comment: Performed at [facility], [address] [place], [place] [address]  ABO/Rh     Status: None   Collection Time: 08/30/18  5:28 PM  Result Value Ref Range   ABO/RH(D)      A POS Performed at [facility], [address], [place], [place] [address]   CBC     Status: Abnormal   Collection Time: 08/30/18  5:33 PM  Result Value Ref Range   WBC 18.6 (H) 4.0 - 10.5 K/uL   RBC 3.51 (L) 4.22 - 5.81 MIL/uL   Hemoglobin 10.5 (L) 13.0 - 17.0 g/dL   HCT  33.3 (L) 39.0 - 52.0 %   MCV 94.9 80.0 - 100.0 fL   MCH 29.9 26.0 - 34.0 pg    MCHC 31.5 30.0 - 36.0 g/dL   RDW 12.6 11.5 - 15.5 %   Platelets 195 150 - 400 K/uL   nRBC 0.0 0.0 - 0.2 %    Comment: Performed at [facility], [address] [place], [place] [address]  Type and screen [facility]     Status: None (Preliminary result)   Collection Time: 08/30/18  5:33 PM  Result Value Ref Range   ABO/RH(D) A POS    Antibody Screen NEG    Sample Expiration 09/02/2018    Unit Number [id number]    Blood Component Type RED CELLS,LR    Unit division 00    Status of Unit ALLOCATED    Transfusion Status OK TO TRANSFUSE    Crossmatch Result Compatible    Unit Number [id number]    Blood Component Type RED CELLS,LR    Unit division 00    Status of Unit ALLOCATED    Transfusion Status OK TO TRANSFUSE    Crossmatch Result      Compatible Performed at [facility], [address] [place], [place] [address]   Lactic acid, plasma     Status: Abnormal   Collection Time: 08/30/18  5:33 PM  Result Value Ref Range   Lactic Acid, Venous 7.0 (HH) 0.5 - 1.9 mmol/L    Comment: CRITICAL RESULT CALLED TO, READ BACK BY AND VERIFIED WITH: MOORE,M ON 08/30/18 AT 1825 BY LOY,C Performed at [facility], [address], [place], [place] [address]   Troponin I - ONCE - STAT     Status: None   Collection Time: 08/30/18  5:33 PM  Result Value Ref Range   Troponin I <0.03 <0.03 ng/mL    Comment: Performed at [facility], [address], [place], [place] [address]  Prepare RBC     Status: None   Collection Time: 08/30/18  5:33 PM  Result Value Ref Range   Order Confirmation      ORDER PROCESSED BY BLOOD BANK Performed at [facility], [address] [place], [place] [address]   Type and screen [place]     Status: None (Preliminary result)   Collection Time: 08/30/18  9:19 PM  Result Value Ref Range   ABO/RH(D) A POS    Antibody Screen NEG    Sample Expiration      09/02/2018 Performed at [facility], [place] [address] [place], [place]  [address]    Unit Number [id number]    Blood Component Type RED CELLS,LR    Unit division 00    Status of Unit ISSUED    Transfusion Status OK TO TRANSFUSE    Crossmatch Result Compatible    Unit Number [id number]    Blood Component Type RED CELLS,LR    Unit division 00    Status of Unit ISSUED    Transfusion Status OK TO TRANSFUSE    Crossmatch Result Compatible    Unit Number [id number]    Blood Component Type RED CELLS,LR    Unit division 00    Status of Unit ALLOCATED    Transfusion Status OK TO TRANSFUSE    Crossmatch Result Compatible    Unit Number [id number]    Blood Component Type RED CELLS,LR    Unit division 00    Status of Unit ALLOCATED    Transfusion Status OK TO TRANSFUSE  Crossmatch Result Compatible   Prepare RBC     Status: None   Collection Time: 08/30/18  9:19 PM  Result Value Ref Range   Order Confirmation      ORDER PROCESSED BY BLOOD BANK Performed at [facility], [place] [address] [place], [place] [address]   ABO/Rh     Status: None   Collection Time: 08/30/18  9:19 PM  Result Value Ref Range   ABO/RH(D)      A POS Performed at [place] [address], [place], [place] [address]   Prepare RBC     Status: None   Collection Time: 08/30/18 10:01 PM  Result Value Ref Range   Order Confirmation      ORDER PROCESSED BY BLOOD BANK Performed at [facility], [place] [address], [place], [place] [address]   MRSA PCR Screening     Status: None   Collection Time: 08/30/18 11:07 PM  Result Value Ref Range   MRSA by PCR NEGATIVE NEGATIVE    Comment:        The GeneXpert MRSA Assay (FDA approved for NASAL specimens only), is one component of a comprehensive MRSA colonization surveillance program. It is not intended to diagnose MRSA infection nor to guide or monitor treatment for MRSA infections. Performed at [facility], [place] [address] [place], [place] [address]   Glucose, capillary     Status: Abnormal    Collection Time: 08/30/18 11:10 PM  Result Value Ref Range   Glucose-Capillary 250 (H) 70 - 99 mg/dL  APTT     Status: None   Collection Time: 08/30/18 11:16 PM  Result Value Ref Range   aPTT 30 24 - 36 seconds    Comment: Performed at [place] [address], [place], [place] [address]  Protime-INR     Status: Abnormal   Collection Time: 08/30/18 11:16 PM  Result Value Ref Range   Prothrombin Time 15.3 (H) 11.4 - 15.2 seconds   INR 1.23     Comment: Performed at [place] [address], [place], [place] [address]  Glucose, capillary     Status: Abnormal   Collection Time: 08/31/18  3:15 AM  Result Value Ref Range   Glucose-Capillary 238 (H) 70 - 99 mg/dL  CBC     Status: Abnormal   Collection Time: 08/31/18  6:37 AM  Result Value Ref Range   WBC 12.1 (H) 4.0 - 10.5 K/uL   RBC 4.62 4.22 - 5.81 MIL/uL   Hemoglobin 13.4 13.0 - 17.0 g/dL   HCT 40.4 39.0 - 52.0 %   MCV 87.4 80.0 - 100.0 fL   MCH 29.0 26.0 - 34.0 pg   MCHC 33.2 30.0 - 36.0 g/dL   RDW 13.5 11.5 - 15.5 %   Platelets 135 (L) 150 - 400 K/uL   nRBC 0.0 0.0 - 0.2 %    Comment: Performed at [facility], [place]. [address] [place], [place] [address]  Comprehensive metabolic panel     Status: Abnormal   Collection Time: 08/31/18  6:37 AM  Result Value Ref Range   Sodium 140 135 - 145 mmol/L   Potassium 5.0 3.5 - 5.1 mmol/L   Chloride 106 98 - 111 mmol/L   CO2 21 (L) 22 - 32 mmol/L   Glucose, Bld 243 (H) 70 - 99 mg/dL   BUN 17 8 - 23 mg/dL   Creatinine, Ser 1.40 (H) 0.61 -  1.24 mg/dL   Calcium 8.3 (L) 8.9 - 10.3 mg/dL   Total Protein 5.0 (L) 6.5 - 8.1 g/dL   Albumin 2.8 (L) 3.5 - 5.0 g/dL   AST 43 (H) 15 - 41 U/L   ALT 18 0 - 44 U/L   Alkaline Phosphatase 50 38 - 126 U/L   Total Bilirubin 1.9 (H) 0.3 - 1.2 mg/dL   GFR calc non Af Amer 46 (L) >60 mL/min   GFR calc Af Amer 54 (L) >60 mL/min   Anion gap 13 5 - 15    Comment: Performed at [place] [address], [place],  [place] [address]  TSH     Status: None   Collection Time: 08/31/18  6:37 AM  Result Value Ref Range   TSH 0.940 0.350 - 4.500 uIU/mL    Comment: Performed by a 3rd Generation assay with a functional sensitivity of <=0.01 uIU/mL. Performed at [facility], [place] [address], [place], [place] [address]   Prealbumin     Status: Abnormal   Collection Time: 08/31/18  6:37 AM  Result Value Ref Range   Prealbumin 14.7 (L) 18 - 38 mg/dL    Comment: Performed at [place] [address] [place], [place] [address]  Lactic acid, plasma     Status: Abnormal   Collection Time: 08/31/18  6:37 AM  Result Value Ref Range   Lactic Acid, Venous 3.7 (HH) 0.5 - 1.9 mmol/L    Comment: CRITICAL RESULT CALLED TO, READ BACK BY AND VERIFIED WITHCheryl Flash RN [phone] 08/31/2018 BY A BENNETT Performed at [facility], [place] [address], [place], [place] [address]   Magnesium     Status: Abnormal   Collection Time: 08/31/18  6:37 AM  Result Value Ref Range   Magnesium 1.3 (L) 1.7 - 2.4 mg/dL    Comment: Performed at [place] [address], [place], [place] [address]  Phosphorus     Status: None   Collection Time: 08/31/18  6:37 AM  Result Value Ref Range   Phosphorus 3.6 2.5 - 4.6 mg/dL    Comment: Performed at [place] [address], [place], [place] [address]  Hemoglobin A1c     Status: Abnormal   Collection Time: 08/31/18  6:37 AM  Result Value Ref Range   Hgb A1c MFr Bld 6.6 (H) 4.8 - 5.6 %    Comment: (NOTE) Pre diabetes:          5.7%-6.4% Diabetes:              >6.4% Glycemic control for   <7.0% adults with diabetes    Mean Plasma Glucose 142.72 mg/dL    Comment: Performed at [place] [address], [place], [place] [address]  Glucose, capillary     Status: Abnormal   Collection Time: 08/31/18  7:42 AM  Result Value Ref Range   Glucose-Capillary 211 (H) 70 - 99 mg/dL   Comment 1 Notify RN    Comment 2 Document in Chart   Prepare RBC     Status:  None   Collection Time: 08/31/18  7:50 AM  Result Value Ref Range   Order Confirmation      ORDER PROCESSED BY BLOOD BANK Performed at [facility], [place] [address], [place],  [address]     Dg Chest 1 View  Result Date: 08/30/2018 CLINICAL DATA:  Trauma secondary to a fall today.  Acute left femur fracture. EXAM: CHEST  1 VIEW COMPARISON:  Chest x-ray dated 04/05/2018 FINDINGS: The heart size and pulmonary vascularity are normal. Aortic atherosclerosis. The lungs are clear. No effusions. No acute bone abnormalities. IMPRESSION: No acute abnormalities. Aortic Atherosclerosis (ICD10-I70.0). Electronically Signed   By: Lorriane Shire M.D.   On: 08/30/2018 16:23   Dg Lumbar Spine 2-3 Views  Result Date: 08/30/2018 CLINICAL DATA:  Pain secondary to a fall today. Left femur fracture. EXAM: LUMBAR SPINE - 2-3 VIEW COMPARISON:  Radiographs dated 03/22/2017 and CT scan dated 12/26/2017 FINDINGS: There is a chronic lumbar scoliosis with convexity to the left centered at L2-3. There is no appreciable fracture. Multilevel degenerative disc disease. Osteophytes fuse the L1-2 and L2-3 levels. Prior CT scan demonstrates at the L5-S1 level is also fused. Aortic atherosclerosis. Moderately severe arthritis of the left hip joint. IMPRESSION: No acute abnormality of the lumbar spine. Degenerative changes as described. Electronically Signed   By: Lorriane Shire M.D.   On: 08/30/2018 16:26   Dg Knee 1-2 Views Left  Result Date: 08/30/2018 CLINICAL DATA:  Left hip fracture.  Left knee pain EXAM: LEFT KNEE - 1-2 VIEW COMPARISON:  None FINDINGS: Moderate tricompartment degenerative changes with joint space narrowing and spurring. Small joint effusion. No acute bony abnormality. Specifically, no fracture, subluxation, or dislocation. IMPRESSION: Moderate osteoarthritis with small joint effusion. No acute bony abnormality. Electronically Signed   By: Rolm Baptise M.D.   On: 08/30/2018 22:52   Dg Knee 1-2 Views  Right  Result Date: 08/30/2018 CLINICAL DATA:  Right knee pain secondary to a fall today. EXAM: RIGHT KNEE - 1-2 VIEW COMPARISON:  None FINDINGS: There is a comminuted impacted fracture of the distal right femoral shaft. There is a vertical component of the fracture which extends through the intercondylar notch. No dislocation. Diffuse osteopenia. Slight arthritic changes at the knee. IMPRESSION: Comminuted impacted distal right femur fracture extending through the intercondylar notch. Electronically Signed   By: Lorriane Shire M.D.   On: 08/30/2018 16:29   Ct Head Wo Contrast  Result Date: 08/30/2018 CLINICAL DATA:  Status post fall. EXAM: CT HEAD WITHOUT CONTRAST CT CERVICAL SPINE WITHOUT CONTRAST TECHNIQUE: Multidetector CT imaging of the head and cervical spine was performed following the standard protocol without intravenous contrast. Multiplanar CT image reconstructions of the cervical spine were also generated. COMPARISON:  Head CT 09/16/2005 FINDINGS: CT HEAD FINDINGS Brain: No evidence of acute infarction, hemorrhage, hydrocephalus, extra-axial collection or mass lesion/mass effect. Moderate brain parenchymal volume loss and deep white matter microangiopathy. Vascular: Calcific atherosclerotic disease. Skull: Normal. Negative for fracture or focal lesion. Sinuses/Orbits: No acute finding. Other: None. CT CERVICAL SPINE FINDINGS Alignment: Exaggerated cervical lordosis. Skull base and vertebrae: No acute fracture. No primary bone lesion or focal pathologic process. Soft tissues and spinal canal: No prevertebral fluid or swelling. No visible canal hematoma. Disc levels: Multilevel osteoarthritic changes with moderate posterior facet arthropathy. Upper chest: Negative. Other: None. IMPRESSION: No acute intracranial abnormality. Atrophy, chronic microvascular disease. No evidence of acute traumatic injury to the cervical spine. Multilevel osteoarthritic changes with exaggerated cervical lordosis.  Electronically Signed   By: Fidela Salisbury M.D.   On: 08/30/2018 16:59   Ct Cervical Spine Wo Contrast  Result Date: 08/30/2018 CLINICAL DATA:  Status post fall. EXAM: CT HEAD WITHOUT CONTRAST CT CERVICAL SPINE WITHOUT CONTRAST TECHNIQUE: Multidetector CT imaging of the head and cervical spine was performed following the standard protocol without intravenous contrast. Multiplanar CT image reconstructions of the  cervical spine were also generated. COMPARISON:  Head CT 09/16/2005 FINDINGS: CT HEAD FINDINGS Brain: No evidence of acute infarction, hemorrhage, hydrocephalus, extra-axial collection or mass lesion/mass effect. Moderate brain parenchymal volume loss and deep white matter microangiopathy. Vascular: Calcific atherosclerotic disease. Skull: Normal. Negative for fracture or focal lesion. Sinuses/Orbits: No acute finding. Other: None. CT CERVICAL SPINE FINDINGS Alignment: Exaggerated cervical lordosis. Skull base and vertebrae: No acute fracture. No primary bone lesion or focal pathologic process. Soft tissues and spinal canal: No prevertebral fluid or swelling. No visible canal hematoma. Disc levels: Multilevel osteoarthritic changes with moderate posterior facet arthropathy. Upper chest: Negative. Other: None. IMPRESSION: No acute intracranial abnormality. Atrophy, chronic microvascular disease. No evidence of acute traumatic injury to the cervical spine. Multilevel osteoarthritic changes with exaggerated cervical lordosis. Electronically Signed   By: Fidela Salisbury M.D.   On: 08/30/2018 16:59   Ct Knee Right Wo Contrast  Result Date: 08/30/2018 CLINICAL DATA:  Patient suffered a distal left femur fracture in a fall today. Initial encounter. EXAM: CT OF THE RIGHT KNEE WITHOUT CONTRAST TECHNIQUE: Multidetector CT imaging of the right knee was performed according to the standard protocol. Multiplanar CT image reconstructions were also generated. COMPARISON:  Plain films right knee earlier  today. FINDINGS: Bones/Joint/Cartilage As seen on the comparison plain films, the patient has a mildly comminuted and impacted fracture through the metaphysis and diaphysis of the right femur. There is impaction of up to approximately 3 cm and posterior displacement of the distal fragment approximately 1 cm. Longitudinal split component of the fracture extends through the central aspect of the femoral trochlea with mild distraction anteriorly of 0.5 cm. Cortical irregularity along the periphery of the medial patellar facet may be due to a mild impaction fracture. No other fracture is identified. Bones are osteopenic. Degenerative change is present about the knee with some joint space narrowing and mild osteophytosis. Ligaments Suboptimally assessed by CT. The cruciate and collateral ligaments appear intact. Muscles and Tendons Intact. Soft tissues Hematoma is seen tracking superficial to the gastrocnemius musculature. The hematoma measures approximately 5 cm transverse by 1.5 cm AP by 7 cm craniocaudal. IMPRESSION: Mildly comminuted and impacted fracture through the diaphysis and metaphysis of the distal right femur includes a longitudinal component through the central aspect of femoral trochlea. Findings compatible with a mild impaction fracture through the periphery of the medial patellar facet. The fracture is incomplete. Ligamentous structures appear intact on CT scan. Hematoma in the posterior aspect of the calf. Electronically Signed   By: Inge Rise M.D.   On: 08/30/2018 19:48   Ct Abdomen Pelvis W Contrast  Result Date: 08/30/2018 CLINICAL DATA:  Hypotension today. Altered mental status. Status post fall today. EXAM: CT ABDOMEN AND PELVIS WITH CONTRAST TECHNIQUE: Multidetector CT imaging of the abdomen and pelvis was performed using the standard protocol following bolus administration of intravenous contrast. CONTRAST:  100 mL OMNIPAQUE IOHEXOL 300 MG/ML  SOLN COMPARISON:  CT abdomen and pelvis  12/26/2017. Plain films left hip earlier today. FINDINGS: Lower chest: Mild dependent atelectasis is seen. Punctate calcified granuloma left lower lobe is identified. Hepatobiliary: No focal liver abnormality is seen. Status post cholecystectomy. No biliary dilatation. Pancreas: Unremarkable. No pancreatic ductal dilatation or surrounding inflammatory changes. Spleen: Calcifications in the spleen consistent with old granulomatous disease noted. Spleen is normal in size. Adrenals/Urinary Tract: Single bilateral renal cysts are unchanged. The kidneys otherwise appear normal. No hydronephrosis or solid renal lesion. Urinary bladder is unremarkable. The adrenal glands appear normal. Stomach/Bowel: The  patient is status post resection of the sigmoid colon with a left lower quadrant ostomy and associated large parastomal hernia, unchanged. The ascending colon is under distended but otherwise unremarkable. Scattered diverticulosis without diverticulitis is identified. No evidence of small bowel obstruction. The stomach is unremarkable. Vascular/Lymphatic: Atherosclerotic vascular disease noted. No aneurysm. No lymphadenopathy. Reproductive: Prostate gland is mildly enlarged, unchanged. Other: None. Musculoskeletal: Proximal left femur fracture is identified as seen on prior plain films. There is hematoma about the patient's fracture. No active extravasation of contrast is seen. The hematoma is incompletely visualized but is of approximately IMPRESSION: No acute abnormality abdomen or pelvis. Status post sigmoid colectomy. 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 Proximal left femur fracture with surrounding hematoma which is incompletely visualized. Atherosclerosis. Electronically Signed   By: Inge Rise M.D.   On: 08/30/2018 19:28   Dg Hips Bilat W Or Wo Pelvis 5 Views  Result Date: 08/30/2018 CLINICAL DATA:  Hip pain and right knee pain secondary to a fall today. EXAM: DG HIP (WITH OR  WITHOUT PELVIS) 5+V BILAT COMPARISON:  None. FINDINGS: There is an angulated displaced overriding spiral fracture of the proximal left femoral shaft. The fracture extends into the anterior or posterior cortex of the intertrochanteric region without definitive involvement of the greater and lesser trochanters. There is moderate arthritis of the left hip joint with joint space narrowing and a slight protrusio deformity. No discrete pelvic fractures. Diffuse osteopenia. Minimal arthritic changes of the right hip. IMPRESSION: Displaced angulated overriding fracture of the proximal left femoral shaft as described. Electronically Signed   By: Lorriane Shire M.D.   On: 08/30/2018 16:16    Review of Systems  Respiratory: Negative for shortness of breath.   Cardiovascular: Negative for chest pain and palpitations.  Gastrointestinal: Negative for abdominal pain and vomiting.  Neurological: Negative for tingling and sensory change.   Blood pressure (!) 118/49, pulse 86, temperature (!) 100.5 F (38.1 C), temperature source Oral, resp. rate 11, height 5\' 7"  (1.702 m), weight 73.5 kg, SpO2 100 %. Physical Exam Vitals signs and nursing note reviewed.  Constitutional:      General: He is not in acute distress.    Comments: Appropriately appearing 85 year old white male, very pleasant  Cardiovascular:     Rate and Rhythm: Normal rate and regular rhythm.     Heart sounds: S1 normal and S2 normal.  Pulmonary:     Effort: Pulmonary effort is normal.     Comments: Clear anterior fields Abdominal:     Palpations: Abdomen is soft.     Comments: Colostomy left lower quadrant  Musculoskeletal:     Comments: Pelvis--no traumatic wounds or rash, no ecchymosis, stable to manual stress, nontender  Left lower extremity Inspection: Left leg is shortened and externally rotated No open wounds or lesions appreciated No significant swelling Patient is in 10 pounds of Buck's traction however it is ill fitting  Bony  eval: Tender to palpation left hip Thigh, knee, lower leg and foot are nontender Mild tenderness with palpation of his medial malleolus  Soft tissue: No open wounds or lesions to the left hip.  Mild swelling to left hip No knee effusion No ankle swelling or ankle effusion Dystrophic toenails Dystrophic skin changes noted bilaterally as well  Sensation: DPN, SPN, TN sensory functions are grossly intact Motor: EHL, FHL, anterior tibialis, posterior tibialis, peroneals and gastrocsoleus complex motor functions are intact  Vascular: + DP pulse Extremity is warm Thigh is soft Compartments are  soft No pain with passive stretching  Right lower extremity Inspection: Knee immobilizer is in place and is fitting well No open wounds or lesions noted No significant swelling to the right leg  Bony eval: Hip is nontender Distal femur is tender Mild tenderness palpation of his knee and patella Ankle is nontender, lower leg nontender Foot is nontender  Soft tissue: No significant swelling noted about the right hip Again no open wounds or lesions no traumatic wounds Moderate to large knee effusion No significant swelling distal leg No ankle swelling or ankle effusion Dystrophic skin changes and nail changes noted  Sensation: DPN, SPN, TN sensory functions are grossly intact Motor: EHL, FHL, anterior tibialis, posterior tibialis, peroneals and gastrocsoleus complex motor functions are intact  Vascular: + DP pulse Extremity is warm Thigh is soft Compartments are soft No pain with passive stretching  Bilateral upper extremities UEx shoulder, elbow, wrist, digits-no traumatic wounds wounds, nontender, no instability, no blocks to motion             No crepitus with manipulation of his upper extremities  Sens  Ax/R/M/U intact  Mot   Ax/ R/ PIN/ M/ AIN/ U intact  Rad 2+   Skin:    General: Skin is warm.  Neurological:     Mental Status: He is alert and oriented to person,  place, and time.  Psychiatric:        Attention and Perception: Attention normal.        Mood and Affect: Mood normal.        Behavior: Behavior is cooperative.            Gait:  Not assessed Coordination and balance:  Not assessed   Assessment/Plan:  85 year old male with history of rectal cancer status post colostomy, diabetes s/p ground-level fall with highly comminuted impacted right intra-articular distal femur fracture and left peritrochanteric hip fracture  -Multiple orthopedic injuries from ground-level fall  Right intra-articular distal femur fracture  Left peritrochanteric hip fracture    Patient will require surgical intervention to address his injuries as they are both unstable injuries.  Would strongly advocate surgery in an effort to give the patient the best opportunity to return to baseline function.   Patient is a walker dependent at baseline and unfortunately may become wheelchair dependent because of these injuries.   We are hopeful to allow him to mobilize somewhat after surgical fixation of his injuries however his bone quality is so poor that we will be unable to accelerate his weightbearing too soon otherwise there may be a catastrophic failure.  Will try her best to balance immobilization with weightbearing restrictions in an effort to minimize his potential for deconditioning.    Patient is at increased risk for perioperative complications due to his medical comorbidities as well as bilateral nature of his injuries.    We are hopeful to proceed with surgery later today however may be delayed until Wednesday depending on OR availability.    Patient will not have any range of motion restrictions postoperatively.    I did remove his Buck's traction today as it was ill fitting and felt that was compromising his skin more and not providing any relief as well.    Continue to roll as needed to maintain decubitus precautions  - Pain management:  Titrate  accordingly minimize narcotics as able although pain management may be difficult as he is on chronic tramadol therapy and has been on for several years  - ABL anemia/Hemodynamics  Has  responded very nicely to 2 units of packed red cells as well as albumin.  Continue to monitor his CBC.  2 units have been typed and crossed in preparation for the OR  - Medical issues   Per trauma service  - DVT/PE prophylaxis:  SCDs  Lovenox postop when able - ID:   Perioperative antibiotics  - Metabolic Bone Disease:  + Osteoporosis given to low energy femur fractures  Will need DEXA scan as an outpatient to facilitate treatment options  Increase vitamin D and add vitamin C  Metabolic bone labs are pending   Patient may be a candidate for Forteo or even entity.  Would advocate anabolic agent at this point as it is bone quality looks very poor as evidenced by his 2 low energy femur fractures.  After completion of anabolic therapy would then transition to Prolia  - Activity:  Bedrest for now  Will determine weightbearing status postoperatively  - FEN/GI prophylaxis/Foley/Lines:  NPO  - Impediments to fracture healing:  Poor bone density/quality  Diabetes   - Dispo:  Possible for OR later this afternoon for IM nailing of left hip and ORIF right distal femur.  This may be delayed until Wednesday depending on or availability today    Jari Pigg, PA-C [phone] (C) 08/31/2018, 9:17 AM  [facility] [place] [address] [phone] (F)

## 2018-08-31 NOTE — Progress Notes (Signed)
CRITICAL VALUE ALERT  Critical Value:  Lactic Acid 3.6  Date & Time Notied:  08/31/2018 10;46  Provider Notified: Thompson  Orders Received/Actions taken: None at this time

## 2018-08-31 NOTE — Progress Notes (Signed)
Patient on OR schedule for tomorrow am for Dr. Doreatha Martin to address both femur fractures.   Please keep NPO after MN.  Micheline Rough, MD Ortho Surgery

## 2018-08-31 NOTE — Progress Notes (Signed)
Orthopedic Tech Progress Note Patient Details:  Ralph Yang 1933-09-07 [id number]  Ortho Devices Type of Ortho Device: Knee Immobilizer Ortho Device/Splint Location: Bucks traction Ortho Device/Splint Interventions: Ordered, Application, Adjustment   Post Interventions Patient Tolerated: Well Instructions Provided: Adjustment of device, Care of device   Ralph Yang J Aydin Cavalieri 08/31/2018, 12:01 AM

## 2018-09-01 ENCOUNTER — Inpatient Hospital Stay (HOSPITAL_COMMUNITY): Payer: Medicare HMO | Admitting: Certified Registered"

## 2018-09-01 ENCOUNTER — Encounter (HOSPITAL_COMMUNITY): Payer: Self-pay | Admitting: *Deleted

## 2018-09-01 ENCOUNTER — Encounter (HOSPITAL_COMMUNITY)
Admission: EM | Disposition: A | Discharge status: Skilled Nursing Facility | Payer: Self-pay | Source: Other Acute Inpatient Hospital

## 2018-09-01 ENCOUNTER — Inpatient Hospital Stay (HOSPITAL_COMMUNITY): Payer: Medicare HMO

## 2018-09-01 HISTORY — PX: ORIF FEMUR FRACTURE: SHX2119

## 2018-09-01 HISTORY — PX: INTRAMEDULLARY (IM) NAIL INTERTROCHANTERIC: SHX5875

## 2018-09-01 LAB — PTH, INTACT AND CALCIUM
Calcium, Total (PTH): 8.1 mg/dL — ABNORMAL LOW (ref 8.6–10.2)
PTH: 11 pg/mL — ABNORMAL LOW (ref 15–65)

## 2018-09-01 LAB — BASIC METABOLIC PANEL
Anion gap: 10 (ref 5–15)
BUN: 14 mg/dL (ref 8–23)
CO2: 23 mmol/L (ref 22–32)
Calcium: 8 mg/dL — ABNORMAL LOW (ref 8.9–10.3)
Chloride: 106 mmol/L (ref 98–111)
Creatinine, Ser: 0.85 mg/dL (ref 0.61–1.24)
GFR calc Af Amer: >60 mL/min (ref >60)
GLUCOSE: 145 mg/dL — AB (ref 70–99)
Potassium: 4.2 mmol/L (ref 3.5–5.1)
Sodium: 139 mmol/L (ref 135–145)

## 2018-09-01 LAB — GLUCOSE, CAPILLARY
Glucose-Capillary: 147 mg/dL — ABNORMAL HIGH (ref 70–99)
Glucose-Capillary: 107 mg/dL — ABNORMAL HIGH (ref 70–99)
Glucose-Capillary: 155 mg/dL — ABNORMAL HIGH (ref 70–99)
Glucose-Capillary: 182 mg/dL — ABNORMAL HIGH (ref 70–99)
Glucose-Capillary: 272 mg/dL — ABNORMAL HIGH (ref 70–99)

## 2018-09-01 LAB — VITAMIN D 25 HYDROXY (VIT D DEFICIENCY, FRACTURES): VIT D 25 HYDROXY: 38.5 ng/mL (ref 30.0–100.0)

## 2018-09-01 LAB — CBC
HEMATOCRIT: 33.3 % — AB (ref 39.0–52.0)
Hemoglobin: 10.9 g/dL — ABNORMAL LOW (ref 13.0–17.0)
MCH: 28.6 pg (ref 26.0–34.0)
MCHC: 32.7 g/dL (ref 30.0–36.0)
MCV: 87.4 fL (ref 80.0–100.0)
PLATELETS: 114 10*3/uL — AB (ref 150–400)
RBC: 3.81 MIL/uL — ABNORMAL LOW (ref 4.22–5.81)
RDW: 14.1 % (ref 11.5–15.5)
WBC: 10.5 10*3/uL (ref 4.0–10.5)
nRBC: 0 % (ref 0.0–0.2)

## 2018-09-01 LAB — LACTIC ACID, PLASMA: Lactic Acid, Venous: 1.7 mmol/L (ref 0.5–1.9)

## 2018-09-01 LAB — CALCIUM, IONIZED: Calcium, Ionized, Serum: 4.5 mg/dL (ref 4.5–5.6)

## 2018-09-01 LAB — CALCITRIOL (1,25 DI-OH VIT D): Vit D, 1,25-Dihydroxy: 36.9 pg/mL (ref 19.9–79.3)

## 2018-09-01 SURGERY — FIXATION, FRACTURE, INTERTROCHANTERIC, WITH INTRAMEDULLARY ROD
Anesthesia: General | Laterality: Right

## 2018-09-01 MED ORDER — OXYCODONE HCL 5 MG PO TABS
5.0000 mg | ORAL_TABLET | ORAL | Status: DC | PRN
  Administered 2018-09-01: 5 mg via ORAL
  Filled 2018-09-01: qty 1

## 2018-09-01 MED ORDER — ALBUMIN HUMAN 5 % IV SOLN
INTRAVENOUS | Status: DC | PRN
  Administered 2018-09-01: 12:00:00 via INTRAVENOUS

## 2018-09-01 MED ORDER — LIDOCAINE 2% (20 MG/ML) 5 ML SYRINGE
INTRAMUSCULAR | Status: DC | PRN
  Administered 2018-09-01: 100 mg via INTRAVENOUS

## 2018-09-01 MED ORDER — DEXAMETHASONE SODIUM PHOSPHATE 10 MG/ML IJ SOLN
INTRAMUSCULAR | Status: AC
  Filled 2018-09-01: qty 1

## 2018-09-01 MED ORDER — ADULT MULTIVITAMIN W/MINERALS CH
1.0000 | ORAL_TABLET | Freq: Every day | ORAL | Status: DC
  Administered 2018-09-01 – 2018-09-05 (×5): 1 via ORAL
  Filled 2018-09-01 (×5): qty 1

## 2018-09-01 MED ORDER — LIDOCAINE 2% (20 MG/ML) 5 ML SYRINGE
INTRAMUSCULAR | Status: AC
  Filled 2018-09-01: qty 5

## 2018-09-01 MED ORDER — ROCURONIUM BROMIDE 50 MG/5ML IV SOSY
PREFILLED_SYRINGE | INTRAVENOUS | Status: AC
  Filled 2018-09-01: qty 5

## 2018-09-01 MED ORDER — OXYCODONE HCL 5 MG/5ML PO SOLN: 5.0000 mg | Freq: Once | ORAL | Status: DC | PRN

## 2018-09-01 MED ORDER — INSULIN ASPART 100 UNIT/ML ~~LOC~~ SOLN
0.0000 [IU] | Freq: Three times a day (TID) | SUBCUTANEOUS | Status: DC
  Administered 2018-09-01: 8 [IU] via SUBCUTANEOUS
  Administered 2018-09-02 (×2): 3 [IU] via SUBCUTANEOUS
  Administered 2018-09-02: 5 [IU] via SUBCUTANEOUS
  Administered 2018-09-03 (×2): 3 [IU] via SUBCUTANEOUS
  Administered 2018-09-03: 5 [IU] via SUBCUTANEOUS
  Administered 2018-09-03 – 2018-09-04 (×3): 3 [IU] via SUBCUTANEOUS
  Administered 2018-09-04: 5 [IU] via SUBCUTANEOUS
  Administered 2018-09-05: 2 [IU] via SUBCUTANEOUS
  Administered 2018-09-05 (×2): 3 [IU] via SUBCUTANEOUS
  Administered 2018-09-06: 5 [IU] via SUBCUTANEOUS
  Administered 2018-09-06: 3 [IU] via SUBCUTANEOUS
  Administered 2018-09-06: 2 [IU] via SUBCUTANEOUS

## 2018-09-01 MED ORDER — SUCCINYLCHOLINE CHLORIDE 20 MG/ML IJ SOLN
INTRAMUSCULAR | Status: DC | PRN
  Administered 2018-09-01: 120 mg via INTRAVENOUS

## 2018-09-01 MED ORDER — PROPOFOL 10 MG/ML IV BOLUS
INTRAVENOUS | Status: AC
  Filled 2018-09-01: qty 20

## 2018-09-01 MED ORDER — SODIUM CHLORIDE 0.9 % IV SOLN
INTRAVENOUS | Status: DC | PRN
  Administered 2018-09-01: 30 ug/min via INTRAVENOUS

## 2018-09-01 MED ORDER — SUCCINYLCHOLINE CHLORIDE 200 MG/10ML IV SOSY
PREFILLED_SYRINGE | INTRAVENOUS | Status: AC
  Filled 2018-09-01: qty 10

## 2018-09-01 MED ORDER — VANCOMYCIN HCL 500 MG IV SOLR
INTRAVENOUS | Status: DC | PRN
  Administered 2018-09-01: 500 mg

## 2018-09-01 MED ORDER — KETOROLAC TROMETHAMINE 30 MG/ML IJ SOLN
INTRAMUSCULAR | Status: DC | PRN
  Administered 2018-09-01: 30 mg via INTRAVENOUS

## 2018-09-01 MED ORDER — HYDROMORPHONE HCL 1 MG/ML IJ SOLN
0.5000 mg | INTRAMUSCULAR | Status: DC | PRN
  Administered 2018-09-01: 1 mg via INTRAVENOUS
  Filled 2018-09-01: qty 1

## 2018-09-01 MED ORDER — SUGAMMADEX SODIUM 200 MG/2ML IV SOLN
INTRAVENOUS | Status: DC | PRN
  Administered 2018-09-01: 50 mg via INTRAVENOUS
  Administered 2018-09-01: 100 mg via INTRAVENOUS

## 2018-09-01 MED ORDER — PHENYLEPHRINE 40 MCG/ML (10ML) SYRINGE FOR IV PUSH (FOR BLOOD PRESSURE SUPPORT)
PREFILLED_SYRINGE | INTRAVENOUS | Status: DC | PRN
  Administered 2018-09-01: 40 ug via INTRAVENOUS
  Administered 2018-09-01 (×2): 120 ug via INTRAVENOUS
  Administered 2018-09-01: 40 ug via INTRAVENOUS
  Administered 2018-09-01: 160 ug via INTRAVENOUS
  Administered 2018-09-01: 40 ug via INTRAVENOUS
  Administered 2018-09-01: 80 ug via INTRAVENOUS

## 2018-09-01 MED ORDER — FENTANYL CITRATE (PF) 100 MCG/2ML IJ SOLN
INTRAMUSCULAR | Status: DC | PRN
  Administered 2018-09-01: 100 ug via INTRAVENOUS
  Administered 2018-09-01 (×2): 25 ug via INTRAVENOUS
  Administered 2018-09-01: 50 ug via INTRAVENOUS

## 2018-09-01 MED ORDER — LACTATED RINGERS IV SOLN
INTRAVENOUS | Status: DC
  Administered 2018-09-01 (×2): via INTRAVENOUS

## 2018-09-01 MED ORDER — ONDANSETRON HCL 4 MG/2ML IJ SOLN
INTRAMUSCULAR | Status: AC
  Filled 2018-09-01: qty 2

## 2018-09-01 MED ORDER — VANCOMYCIN HCL 1000 MG IV SOLR
INTRAVENOUS | Status: AC
  Filled 2018-09-01: qty 1000

## 2018-09-01 MED ORDER — PHENYLEPHRINE 40 MCG/ML (10ML) SYRINGE FOR IV PUSH (FOR BLOOD PRESSURE SUPPORT)
PREFILLED_SYRINGE | INTRAVENOUS | Status: AC
  Filled 2018-09-01: qty 10

## 2018-09-01 MED ORDER — VANCOMYCIN HCL 1000 MG IV SOLR
INTRAVENOUS | Status: DC | PRN
  Administered 2018-09-01: 1000 mg

## 2018-09-01 MED ORDER — 0.9 % SODIUM CHLORIDE (POUR BTL) OPTIME
TOPICAL | Status: DC | PRN
  Administered 2018-09-01: 1000 mL

## 2018-09-01 MED ORDER — ENOXAPARIN SODIUM 40 MG/0.4ML ~~LOC~~ SOLN: 40.0000 mg | SUBCUTANEOUS | Status: DC

## 2018-09-01 MED ORDER — HYDROMORPHONE HCL 1 MG/ML IJ SOLN
INTRAMUSCULAR | Status: AC
  Filled 2018-09-01: qty 1

## 2018-09-01 MED ORDER — CEFAZOLIN SODIUM-DEXTROSE 2-4 GM/100ML-% IV SOLN
2.0000 g | Freq: Three times a day (TID) | INTRAVENOUS | Status: AC
  Administered 2018-09-01 – 2018-09-02 (×3): 2 g via INTRAVENOUS
  Filled 2018-09-01 (×4): qty 100

## 2018-09-01 MED ORDER — FENTANYL CITRATE (PF) 250 MCG/5ML IJ SOLN
INTRAMUSCULAR | Status: AC
  Filled 2018-09-01: qty 5

## 2018-09-01 MED ORDER — OXYCODONE HCL 5 MG PO TABS: 5.0000 mg | ORAL_TABLET | Freq: Once | ORAL | Status: DC | PRN

## 2018-09-01 MED ORDER — ONDANSETRON HCL 4 MG/2ML IJ SOLN
INTRAMUSCULAR | Status: DC | PRN
  Administered 2018-09-01: 4 mg via INTRAVENOUS

## 2018-09-01 MED ORDER — ROCURONIUM BROMIDE 50 MG/5ML IV SOSY
PREFILLED_SYRINGE | INTRAVENOUS | Status: DC | PRN
  Administered 2018-09-01: 50 mg via INTRAVENOUS

## 2018-09-01 MED ORDER — PROPOFOL 10 MG/ML IV BOLUS
INTRAVENOUS | Status: DC | PRN
  Administered 2018-09-01: 100 mg via INTRAVENOUS

## 2018-09-01 MED ORDER — PROMETHAZINE HCL 25 MG/ML IJ SOLN: 6.2500 mg | INTRAMUSCULAR | Status: DC | PRN

## 2018-09-01 MED ORDER — EPHEDRINE SULFATE-NACL 50-0.9 MG/10ML-% IV SOSY
PREFILLED_SYRINGE | INTRAVENOUS | Status: DC | PRN
  Administered 2018-09-01 (×2): 5 mg via INTRAVENOUS

## 2018-09-01 MED ORDER — HYDROMORPHONE HCL 1 MG/ML IJ SOLN
0.2500 mg | INTRAMUSCULAR | Status: DC | PRN
  Administered 2018-09-01 (×2): 0.25 mg via INTRAVENOUS

## 2018-09-01 MED ORDER — VANCOMYCIN HCL 500 MG IV SOLR
INTRAVENOUS | Status: AC
  Filled 2018-09-01: qty 500

## 2018-09-01 MED ORDER — DEXAMETHASONE SODIUM PHOSPHATE 10 MG/ML IJ SOLN
INTRAMUSCULAR | Status: DC | PRN
  Administered 2018-09-01: 5 mg via INTRAVENOUS

## 2018-09-01 MED ORDER — ACETAMINOPHEN 325 MG PO TABS
650.0000 mg | ORAL_TABLET | Freq: Four times a day (QID) | ORAL | Status: DC
  Administered 2018-09-01 – 2018-09-02 (×6): 650 mg via ORAL
  Filled 2018-09-01 (×6): qty 2

## 2018-09-01 SURGICAL SUPPLY — 100 items
BANDAGE ACE 4X5 VEL STRL LF (GAUZE/BANDAGES/DRESSINGS) ×3 IMPLANT
BANDAGE ACE 6X5 VEL STRL LF (GAUZE/BANDAGES/DRESSINGS) ×3 IMPLANT
BIT DRILL 4.3 (BIT) ×3
BIT DRILL 4.3MMS DISTAL GRDTED (BIT) IMPLANT
BIT DRILL 4.3X300MM (BIT) IMPLANT
BIT DRILL LONG 3.3 (BIT) ×1 IMPLANT
BIT DRILL QC 3.3X195 (BIT) ×1 IMPLANT
BLADE CLIPPER SURG (BLADE) IMPLANT
BLADE SURG 10 STRL SS (BLADE) ×1 IMPLANT
BLADE SURG 15 STRL LF DISP TIS (BLADE) IMPLANT
BLADE SURG 15 STRL SS (BLADE) ×1
BNDG COHESIVE 6X5 TAN STRL LF (GAUZE/BANDAGES/DRESSINGS) IMPLANT
BNDG ELASTIC 6X15 VLCR STRL LF (GAUZE/BANDAGES/DRESSINGS) ×1 IMPLANT
BNDG GAUZE ELAST 4 BULKY (GAUZE/BANDAGES/DRESSINGS) ×3 IMPLANT
BRUSH SCRUB SURG 4.25 DISP (MISCELLANEOUS) ×6 IMPLANT
CANISTER SUCT 3000ML PPV (MISCELLANEOUS) ×3 IMPLANT
CAP LOCK NCB (Cap) ×7 IMPLANT
COVER PERINEAL POST (MISCELLANEOUS) ×3 IMPLANT
COVER SURGICAL LIGHT HANDLE (MISCELLANEOUS) ×6 IMPLANT
COVER WAND RF STERILE (DRAPES) ×3 IMPLANT
DRAPE C-ARM 42X72 X-RAY (DRAPES) ×3 IMPLANT
DRAPE C-ARMOR (DRAPES) ×3 IMPLANT
DRAPE HALF SHEET 40X57 (DRAPES) IMPLANT
DRAPE IMP U-DRAPE 54X76 (DRAPES) ×3 IMPLANT
DRAPE ORTHO SPLIT 77X108 STRL (DRAPES) ×3
DRAPE STERI IOBAN 125X83 (DRAPES) ×4 IMPLANT
DRAPE SURG ORHT 6 SPLT 77X108 (DRAPES) ×6 IMPLANT
DRAPE U-SHAPE 47X51 STRL (DRAPES) ×3 IMPLANT
DRILL 4.3MMS DISTAL GRADUATED (BIT) ×6
DRSG ADAPTIC 3X8 NADH LF (GAUZE/BANDAGES/DRESSINGS) ×3 IMPLANT
DRSG EMULSION OIL 3X3 NADH (GAUZE/BANDAGES/DRESSINGS) ×3 IMPLANT
DRSG MEPILEX BORDER 4X4 (GAUZE/BANDAGES/DRESSINGS) ×5 IMPLANT
DRSG MEPILEX BORDER 4X8 (GAUZE/BANDAGES/DRESSINGS) ×4 IMPLANT
DRSG PAD ABDOMINAL 8X10 ST (GAUZE/BANDAGES/DRESSINGS) ×12 IMPLANT
ELECT REM PT RETURN 9FT ADLT (ELECTROSURGICAL) ×3
ELECTRODE REM PT RTRN 9FT ADLT (ELECTROSURGICAL) ×2 IMPLANT
EVACUATOR 1/8 PVC DRAIN (DRAIN) IMPLANT
EVACUATOR 3/16  PVC DRAIN (DRAIN)
EVACUATOR 3/16 PVC DRAIN (DRAIN) IMPLANT
GAUZE SPONGE 4X4 12PLY STRL (GAUZE/BANDAGES/DRESSINGS) ×3 IMPLANT
GLOVE BIO SURGEON STRL SZ7.5 (GLOVE) ×3 IMPLANT
GLOVE BIO SURGEON STRL SZ8 (GLOVE) ×8 IMPLANT
GLOVE BIOGEL PI IND STRL 7.5 (GLOVE) ×2 IMPLANT
GLOVE BIOGEL PI IND STRL 8 (GLOVE) ×2 IMPLANT
GLOVE BIOGEL PI INDICATOR 7.5 (GLOVE) ×1
GLOVE BIOGEL PI INDICATOR 8 (GLOVE) ×1
GOWN STRL REUS W/ TWL LRG LVL3 (GOWN DISPOSABLE) ×4 IMPLANT
GOWN STRL REUS W/ TWL XL LVL3 (GOWN DISPOSABLE) ×2 IMPLANT
GOWN STRL REUS W/TWL LRG LVL3 (GOWN DISPOSABLE) ×4
GOWN STRL REUS W/TWL XL LVL3 (GOWN DISPOSABLE) ×2
GUIDEPIN 3.2X17.5 THRD DISP (PIN) ×1 IMPLANT
GUIDEWIRE BALL NOSE 100CM (WIRE) ×1 IMPLANT
HFN LH 130 DEG 9MM X 420MM (Nail) ×1 IMPLANT
HIP FRAC NAIL LAG SCR 10.5X100 (Orthopedic Implant) ×1 IMPLANT
K-WIRE 2.0 (WIRE) ×1
K-WIRE FXSTD 280X2XNS SS (WIRE) ×2
KIT BASIN OR (CUSTOM PROCEDURE TRAY) ×3 IMPLANT
KIT TURNOVER KIT B (KITS) ×3 IMPLANT
KWIRE FXSTD 280X2XNS SS (WIRE) IMPLANT
LINER BOOT UNIVERSAL DISP (MISCELLANEOUS) ×3 IMPLANT
MANIFOLD NEPTUNE II (INSTRUMENTS) ×3 IMPLANT
NEEDLE 22X1 1/2 (OR ONLY) (NEEDLE) IMPLANT
NS IRRIG 1000ML POUR BTL (IV SOLUTION) ×3 IMPLANT
PACK GENERAL/GYN (CUSTOM PROCEDURE TRAY) ×3 IMPLANT
PACK TOTAL JOINT (CUSTOM PROCEDURE TRAY) ×3 IMPLANT
PACK UNIVERSAL I (CUSTOM PROCEDURE TRAY) ×3 IMPLANT
PAD ARMBOARD 7.5X6 YLW CONV (MISCELLANEOUS) ×6 IMPLANT
PAD CAST 4YDX4 CTTN HI CHSV (CAST SUPPLIES) ×2 IMPLANT
PADDING CAST COTTON 4X4 STRL (CAST SUPPLIES) ×1
PADDING CAST COTTON 6X4 STRL (CAST SUPPLIES) ×3 IMPLANT
PLATE FEM DIST NCB PP 278MM (Plate) ×1 IMPLANT
SCREW BONE CORTICAL 5.0X40 (Screw) ×1 IMPLANT
SCREW BONE CORTICAL 5.0X44 (Screw) ×1 IMPLANT
SCREW CANN THRD AFF 10.5X100 (Orthopedic Implant) IMPLANT
SCREW CORTICAL NCB 5.0X40 (Screw) ×1 IMPLANT
SCREW CORTICAL NCB 5.0X90MM (Screw) ×1 IMPLANT
SCREW NCB 3.5X75X5X6.2XST (Screw) IMPLANT
SCREW NCB 4.0MX38M (Screw) ×2 IMPLANT
SCREW NCB 5.0X75MM (Screw) ×1 IMPLANT
SCREW NCB 5.0X85MM (Screw) ×3 IMPLANT
SPONGE LAP 18X18 RF (DISPOSABLE) ×3 IMPLANT
SPONGE LAP 18X18 X RAY DECT (DISPOSABLE) ×3 IMPLANT
STAPLER VISISTAT 35W (STAPLE) ×3 IMPLANT
STOCKINETTE IMPERVIOUS LG (DRAPES) IMPLANT
SUCTION FRAZIER HANDLE 10FR (MISCELLANEOUS) ×1
SUCTION TUBE FRAZIER 10FR DISP (MISCELLANEOUS) ×2 IMPLANT
SUT ETHILON 3 0 FSL (SUTURE) ×1 IMPLANT
SUT ETHILON 3 0 PS 1 (SUTURE) ×3 IMPLANT
SUT PROLENE 0 CT 2 (SUTURE) IMPLANT
SUT VIC AB 0 CT1 27 (SUTURE) ×2
SUT VIC AB 0 CT1 27XBRD ANBCTR (SUTURE) ×4 IMPLANT
SUT VIC AB 1 CT1 27 (SUTURE) ×2
SUT VIC AB 1 CT1 27XBRD ANBCTR (SUTURE) ×4 IMPLANT
SUT VIC AB 2-0 CT1 27 (SUTURE) ×2
SUT VIC AB 2-0 CT1 TAPERPNT 27 (SUTURE) ×4 IMPLANT
SYR 20ML ECCENTRIC (SYRINGE) IMPLANT
TOWEL OR 17X24 6PK STRL BLUE (TOWEL DISPOSABLE) ×3 IMPLANT
TOWEL OR 17X26 10 PK STRL BLUE (TOWEL DISPOSABLE) ×6 IMPLANT
TRAY FOLEY MTR SLVR 16FR STAT (SET/KITS/TRAYS/PACK) IMPLANT
WATER STERILE IRR 1000ML POUR (IV SOLUTION) ×6 IMPLANT

## 2018-09-01 NOTE — Progress Notes (Signed)
Day of Surgery   Subjective/Chief Complaint: LEG PAIN   Patient complains of bilateral leg pain.  He is scheduled for fixation of both femur fractures later on today.  He has no other complaints.  Objective: Vital signs in last 24 hours: Temp:  [98.1 F (36.7 C)-99.6 F (37.6 C)] 98.1 F (36.7 C) (02/12 0400) Pulse Rate:  [67-99] 70 (02/12 0800) Resp:  [9-26] 9 (02/12 0800) BP: (96-160)/(49-73) 116/62 (02/12 0800) SpO2:  [96 %-100 %] 100 % (02/12 0800) Last BM Date: 09/01/18  Intake/Output from previous day: 02/11 0701 - 02/12 0700 In: 2392.1 [I.V.:1465.3; IV Piggyback:926.8] Out: 1005 [Urine:1005] Intake/Output this shift: Total I/O In: 50 [I.V.:50] Out: -   General appearance: alert and cooperative Resp: clear to auscultation bilaterally Cardio: regular rate and rhythm, S1, S2 normal, no murmur, click, rub or gallop GI: Soft nontender.  Left lower quadrant colostomy functioning.  Parastomal hernia reducible Extremities: Warm well perfused.  Bilateral splinting noted/traction  Lab Results:  Recent Labs    08/31/18 2018 09/01/18 0451  WBC 11.9* 10.5  HGB 11.3* 10.9*  HCT 33.8* 33.3*  PLT 116* 114*   BMET Recent Labs    08/31/18 0637 09/01/18 0451  NA 140 139  K 5.0 4.2  CL 106 106  CO2 21* 23  GLUCOSE 243* 145*  BUN 17 14  CREATININE 1.40* 0.85  CALCIUM 8.3*  8.1* 8.0*   PT/INR Recent Labs    08/30/18 2316  LABPROT 15.3*  INR 1.23   ABG No results for input(s): PHART, HCO3 in the last 72 hours.  Invalid input(s): PCO2, PO2  Studies/Results: Dg Chest 1 View  Result Date: 08/30/2018 CLINICAL DATA:  Trauma secondary to Ralph Yang fall today. Acute left femur fracture. EXAM: CHEST  1 VIEW COMPARISON:  Chest x-ray dated 04/05/2018 FINDINGS: The heart size and pulmonary vascularity are normal. Aortic atherosclerosis. The lungs are clear. No effusions. No acute bone abnormalities. IMPRESSION: No acute abnormalities. Aortic Atherosclerosis (ICD10-I70.0).  Electronically Signed   By: Lorriane Shire M.D.   On: 08/30/2018 16:23   Dg Lumbar Spine 2-3 Views  Result Date: 08/30/2018 CLINICAL DATA:  Pain secondary to Ralph Yang fall today. Left femur fracture. EXAM: LUMBAR SPINE - 2-3 VIEW COMPARISON:  Radiographs dated 03/22/2017 and CT scan dated 12/26/2017 FINDINGS: There is Ralph Yang chronic lumbar scoliosis with convexity to the left centered at L2-3. There is no appreciable fracture. Multilevel degenerative disc disease. Osteophytes fuse the L1-2 and L2-3 levels. Prior CT scan demonstrates at the L5-S1 level is also fused. Aortic atherosclerosis. Moderately severe arthritis of the left hip joint. IMPRESSION: No acute abnormality of the lumbar spine. Degenerative changes as described. Electronically Signed   By: Lorriane Shire M.D.   On: 08/30/2018 16:26   Dg Knee 1-2 Views Left  Result Date: 08/30/2018 CLINICAL DATA:  Left hip fracture.  Left knee pain EXAM: LEFT KNEE - 1-2 VIEW COMPARISON:  None FINDINGS: Moderate tricompartment degenerative changes with joint space narrowing and spurring. Small joint effusion. No acute bony abnormality. Specifically, no fracture, subluxation, or dislocation. IMPRESSION: Moderate osteoarthritis with small joint effusion. No acute bony abnormality. Electronically Signed   By: Rolm Baptise M.D.   On: 08/30/2018 22:52   Dg Knee 1-2 Views Right  Result Date: 08/30/2018 CLINICAL DATA:  Right knee pain secondary to Ralph Yang fall today. EXAM: RIGHT KNEE - 1-2 VIEW COMPARISON:  None FINDINGS: There is Ralph Yang comminuted impacted fracture of the distal right femoral shaft. There is Ralph Yang vertical component of the fracture  which extends through the intercondylar notch. No dislocation. Diffuse osteopenia. Slight arthritic changes at the knee. IMPRESSION: Comminuted impacted distal right femur fracture extending through the intercondylar notch. Electronically Signed   By: Lorriane Shire M.D.   On: 08/30/2018 16:29   Ct Head Wo Contrast  Result Date:  08/30/2018 CLINICAL DATA:  Status post fall. EXAM: CT HEAD WITHOUT CONTRAST CT CERVICAL SPINE WITHOUT CONTRAST TECHNIQUE: Multidetector CT imaging of the head and cervical spine was performed following the standard protocol without intravenous contrast. Multiplanar CT image reconstructions of the cervical spine were also generated. COMPARISON:  Head CT 09/16/2005 FINDINGS: CT HEAD FINDINGS Brain: No evidence of acute infarction, hemorrhage, hydrocephalus, extra-axial collection or mass lesion/mass effect. Moderate brain parenchymal volume loss and deep white matter microangiopathy. Vascular: Calcific atherosclerotic disease. Skull: Normal. Negative for fracture or focal lesion. Sinuses/Orbits: No acute finding. Other: None. CT CERVICAL SPINE FINDINGS Alignment: Exaggerated cervical lordosis. Skull base and vertebrae: No acute fracture. No primary bone lesion or focal pathologic process. Soft tissues and spinal canal: No prevertebral fluid or swelling. No visible canal hematoma. Disc levels: Multilevel osteoarthritic changes with moderate posterior facet arthropathy. Upper chest: Negative. Other: None. IMPRESSION: No acute intracranial abnormality. Atrophy, chronic microvascular disease. No evidence of acute traumatic injury to the cervical spine. Multilevel osteoarthritic changes with exaggerated cervical lordosis. Electronically Signed   By: Fidela Salisbury M.D.   On: 08/30/2018 16:59   Ct Cervical Spine Wo Contrast  Result Date: 08/30/2018 CLINICAL DATA:  Status post fall. EXAM: CT HEAD WITHOUT CONTRAST CT CERVICAL SPINE WITHOUT CONTRAST TECHNIQUE: Multidetector CT imaging of the head and cervical spine was performed following the standard protocol without intravenous contrast. Multiplanar CT image reconstructions of the cervical spine were also generated. COMPARISON:  Head CT 09/16/2005 FINDINGS: CT HEAD FINDINGS Brain: No evidence of acute infarction, hemorrhage, hydrocephalus, extra-axial collection or  mass lesion/mass effect. Moderate brain parenchymal volume loss and deep white matter microangiopathy. Vascular: Calcific atherosclerotic disease. Skull: Normal. Negative for fracture or focal lesion. Sinuses/Orbits: No acute finding. Other: None. CT CERVICAL SPINE FINDINGS Alignment: Exaggerated cervical lordosis. Skull base and vertebrae: No acute fracture. No primary bone lesion or focal pathologic process. Soft tissues and spinal canal: No prevertebral fluid or swelling. No visible canal hematoma. Disc levels: Multilevel osteoarthritic changes with moderate posterior facet arthropathy. Upper chest: Negative. Other: None. IMPRESSION: No acute intracranial abnormality. Atrophy, chronic microvascular disease. No evidence of acute traumatic injury to the cervical spine. Multilevel osteoarthritic changes with exaggerated cervical lordosis. Electronically Signed   By: Fidela Salisbury M.D.   On: 08/30/2018 16:59   Ct Knee Right Wo Contrast  Result Date: 08/30/2018 CLINICAL DATA:  Patient suffered Ralph Yang distal left femur fracture in Ralph Yang fall today. Initial encounter. EXAM: CT OF THE RIGHT KNEE WITHOUT CONTRAST TECHNIQUE: Multidetector CT imaging of the right knee was performed according to the standard protocol. Multiplanar CT image reconstructions were also generated. COMPARISON:  Plain films right knee earlier today. FINDINGS: Bones/Joint/Cartilage As seen on the comparison plain films, the patient has Ralph Yang mildly comminuted and impacted fracture through the metaphysis and diaphysis of the right femur. There is impaction of up to approximately 3 cm and posterior displacement of the distal fragment approximately 1 cm. Longitudinal split component of the fracture extends through the central aspect of the femoral trochlea with mild distraction anteriorly of 0.5 cm. Cortical irregularity along the periphery of the medial patellar facet may be due to Ralph Yang mild impaction fracture. No other fracture is identified. Bones  are  osteopenic. Degenerative change is present about the knee with some joint space narrowing and mild osteophytosis. Ligaments Suboptimally assessed by CT. The cruciate and collateral ligaments appear intact. Muscles and Tendons Intact. Soft tissues Hematoma is seen tracking superficial to the gastrocnemius musculature. The hematoma measures approximately 5 cm transverse by 1.5 cm AP by 7 cm craniocaudal. IMPRESSION: Mildly comminuted and impacted fracture through the diaphysis and metaphysis of the distal right femur includes Ralph Yang longitudinal component through the central aspect of femoral trochlea. Findings compatible with Ralph Yang mild impaction fracture through the periphery of the medial patellar facet. The fracture is incomplete. Ligamentous structures appear intact on CT scan. Hematoma in the posterior aspect of the calf. Electronically Signed   By: Inge Rise M.D.   On: 08/30/2018 19:48   Ct Abdomen Pelvis W Contrast  Result Date: 08/30/2018 CLINICAL DATA:  Hypotension today. Altered mental status. Status post fall today. EXAM: CT ABDOMEN AND PELVIS WITH CONTRAST TECHNIQUE: Multidetector CT imaging of the abdomen and pelvis was performed using the standard protocol following bolus administration of intravenous contrast. CONTRAST:  100 mL OMNIPAQUE IOHEXOL 300 MG/ML  SOLN COMPARISON:  CT abdomen and pelvis 12/26/2017. Plain films left hip earlier today. FINDINGS: Lower chest: Mild dependent atelectasis is seen. Punctate calcified granuloma left lower lobe is identified. Hepatobiliary: No focal liver abnormality is seen. Status post cholecystectomy. No biliary dilatation. Pancreas: Unremarkable. No pancreatic ductal dilatation or surrounding inflammatory changes. Spleen: Calcifications in the spleen consistent with old granulomatous disease noted. Spleen is normal in size. Adrenals/Urinary Tract: Single bilateral renal cysts are unchanged. The kidneys otherwise appear normal. No hydronephrosis or solid renal  lesion. Urinary bladder is unremarkable. The adrenal glands appear normal. Stomach/Bowel: The patient is status post resection of the sigmoid colon with Ralph Yang left lower quadrant ostomy and associated large parastomal hernia, unchanged. The ascending colon is under distended but otherwise unremarkable. Scattered diverticulosis without diverticulitis is identified. No evidence of small bowel obstruction. The stomach is unremarkable. Vascular/Lymphatic: Atherosclerotic vascular disease noted. No aneurysm. No lymphadenopathy. Reproductive: Prostate gland is mildly enlarged, unchanged. Other: None. Musculoskeletal: Proximal left femur fracture is identified as seen on prior plain films. There is hematoma about the patient's fracture. No active extravasation of contrast is seen. The hematoma is incompletely visualized but is of approximately IMPRESSION: No acute abnormality abdomen or pelvis. Status post sigmoid colectomy. Left lower quadrant ostomy with Ralph Yang large parastomal hernia noted. Proximal left femur fracture with surrounding hematoma which is incompletely visualized. Atherosclerosis. Electronically Signed   By: Inge Rise M.D.   On: 08/30/2018 19:28   Dg Ankle Left Port  Result Date: 08/31/2018 CLINICAL DATA:  Left ankle pain. EXAM: PORTABLE LEFT ANKLE - 2 VIEW COMPARISON:  None. FINDINGS: There is no evidence of fracture, dislocation, or joint effusion. There is no evidence of arthropathy or other focal bone abnormality. Soft tissues are unremarkable. IMPRESSION: Negative. Electronically Signed   By: Marijo Conception, M.D.   On: 08/31/2018 09:52   Dg Hips Bilat W Or Wo Pelvis 5 Views  Result Date: 08/30/2018 CLINICAL DATA:  Hip pain and right knee pain secondary to Ralph Yang fall today. EXAM: DG HIP (WITH OR WITHOUT PELVIS) 5+V BILAT COMPARISON:  None. FINDINGS: There is an angulated displaced overriding spiral fracture of the proximal left femoral shaft. The fracture extends into the anterior or posterior  cortex of the intertrochanteric region without definitive involvement of the greater and lesser trochanters. There is moderate arthritis of the left hip joint with  joint space narrowing and Ralph Yang slight protrusio deformity. No discrete pelvic fractures. Diffuse osteopenia. Minimal arthritic changes of the right hip. IMPRESSION: Displaced angulated overriding fracture of the proximal left femoral shaft as described. Electronically Signed   By: Lorriane Shire M.D.   On: 08/30/2018 16:16    Anti-infectives: Anti-infectives (From admission, onward)   Start     Dose/Rate Route Frequency Ordered Stop   08/31/18 1330  ceFAZolin (ANCEF) IVPB 2g/100 mL premix     2 g 200 mL/hr over 30 Minutes Intravenous To [facility] 08/30/18 2308 09/01/18 1330      Assessment/Plan: Fall R distal femur FX - KI, for ORIF today by Dr. Marcelino Scot, metabolic bone work-up L proximal femur FX - for IM nail today by Dr. Marcelino Scot ABL anemia - Hb up to 13, 2u available for OR CV - hypotension related to blood loss has resolved after transfusion AKI - continue fluids, albumin bolus as below, F/U Elevated lactate - albumin bolus now DM - SSI HX colon cancer with colostomy and chronic peristomal hernia FEN - NPO for OR, CMET pending VTE - no Lovenox yet Dispo - ICU, OR   LOS: 2 days    Ralph Yang Yang Ralph Yang Yang 09/01/2018

## 2018-09-01 NOTE — Anesthesia Preprocedure Evaluation (Signed)
Anesthesia Evaluation  Patient identified by MRN, date of birth, ID band Patient awake    Reviewed: Allergy & Precautions, NPO status , Patient's Chart, lab work & pertinent test results  Airway Mallampati: II  TM Distance: >3 FB Neck ROM: Full    Dental no notable dental hx.    Pulmonary neg pulmonary ROS,    Pulmonary exam normal breath sounds clear to auscultation       Cardiovascular hypertension, Pt. on medications negative cardio ROS Normal cardiovascular exam Rhythm:Regular Rate:Normal     Neuro/Psych negative neurological ROS  negative psych ROS   GI/Hepatic negative GI ROS, Neg liver ROS,   Endo/Other  negative endocrine ROSdiabetes, Type 2, Oral Hypoglycemic Agents  Renal/GU negative Renal ROS  negative genitourinary   Musculoskeletal  (+) Arthritis , Osteoarthritis,    Abdominal   Peds negative pediatric ROS (+)  Hematology negative hematology ROS (+)   Anesthesia Other Findings   Reproductive/Obstetrics negative OB ROS                             Anesthesia Physical Anesthesia Plan  ASA: III  Anesthesia Plan: General   Post-op Pain Management:    Induction: Intravenous  PONV Risk Score and Plan: 2 and Ondansetron, Midazolam and Treatment may vary due to age or medical condition  Airway Management Planned: Oral ETT  Additional Equipment:   Intra-op Plan:   Post-operative Plan: Extubation in OR  Informed Consent: I have reviewed the patients History and Physical, chart, labs and discussed the procedure including the risks, benefits and alternatives for the proposed anesthesia with the patient or authorized representative who has indicated his/her understanding and acceptance.     Dental advisory given  Plan Discussed with: CRNA  Anesthesia Plan Comments:         Anesthesia Quick Evaluation

## 2018-09-01 NOTE — Transfer of Care (Signed)
Immediate Anesthesia Transfer of Care Note  Patient: Ralph Yang  Procedure(s) Performed: INTRAMEDULLARY (IM) NAIL INTERTROCHANTRIC (Left ) OPEN REDUCTION INTERNAL FIXATION (ORIF) DISTAL FEMUR FRACTURE (Right )  Patient Location: PACU  Anesthesia Type:General  Level of Consciousness: awake, alert  and oriented  Airway & Oxygen Therapy: Patient Spontanous Breathing and Patient connected to face mask oxygen  Post-op Assessment: Report given to RN and Post -op Vital signs reviewed and stable  Post vital signs: Reviewed and stable  Last Vitals:  Vitals Value Taken Time  BP 121/82 09/01/2018  1:33 PM  Temp    Pulse 94 09/01/2018  1:37 PM  Resp 19 09/01/2018  1:37 PM  SpO2 100 % 09/01/2018  1:37 PM  Vitals shown include unvalidated device data.  Last Pain:  Vitals:   09/01/18 0800  TempSrc: Oral  PainSc:          Complications: No apparent anesthesia complications

## 2018-09-01 NOTE — Anesthesia Procedure Notes (Signed)
Procedure Name: Intubation Date/Time: 09/01/2018 10:23 AM Performed by: Trinna Post., CRNA Pre-anesthesia Checklist: Patient identified, Emergency Drugs available, Suction available, Patient being monitored and Timeout performed Patient Re-evaluated:Patient Re-evaluated prior to induction Oxygen Delivery Method: Circle system utilized Preoxygenation: Pre-oxygenation with 100% oxygen Induction Type: IV induction Ventilation: Mask ventilation without difficulty and Oral airway inserted - appropriate to patient size Laryngoscope Size: Mac and 4 Grade View: Grade I Tube type: Oral Tube size: 7.5 mm Number of attempts: 1 Airway Equipment and Method: Stylet Placement Confirmation: ETT inserted through vocal cords under direct vision,  positive ETCO2 and breath sounds checked- equal and bilateral Secured at: 22 cm Tube secured with: Tape Dental Injury: Teeth and Oropharynx as per pre-operative assessment

## 2018-09-01 NOTE — Interval H&P Note (Signed)
History and Physical Interval Note:  09/01/2018 8:41 AM  Ralph Yang  has presented today for surgery, with the diagnosis of Left Hip fracture, Left distal femur fracture  The various methods of treatment have been discussed with the patient and family. After consideration of risks, benefits and other options for treatment, the patient has consented to  Procedure(s): INTRAMEDULLARY (IM) NAIL INTERTROCHANTRIC (Left) OPEN REDUCTION INTERNAL FIXATION (ORIF) DISTAL FEMUR FRACTURE (Right) as a surgical intervention .  The patient's history has been reviewed, patient examined, no change in status, stable for surgery.  I have reviewed the patient's chart and labs.  Questions were answered to the patient's satisfaction.     Lennette Bihari P Hoyte Ziebell

## 2018-09-01 NOTE — Progress Notes (Signed)
Initial Nutrition Assessment  DOCUMENTATION CODES:   Not applicable  INTERVENTION:    Magic cup TID with meals, each supplement provides 290 kcal and 9 grams of protein  MVI daily  NUTRITION DIAGNOSIS:   Increased nutrient needs related to post-op healing as evidenced by estimated needs.  GOAL:   Patient will meet greater than or equal to 90% of their needs  MONITOR:   PO intake, Supplement acceptance, Weight trends, Labs  REASON FOR ASSESSMENT:   Rounds    ASSESSMENT:   Patient with PMH significant for rectal cancer s/p colostomy, DM, and HTN. Presents this admission with bilateral femur fractures after a mechanical fall.    2/12- fixation bilateral femurs  Pt alert and oriented s/p surgery. Denies any loss in appetite PTA. States he typically eats three meals daily that consist of: B- 2 oatmeal cream pies and hot chocolate; L- deli meat sandwich; D- meat, vegetable, and grain; snack- chips. Discussed the importance of protein intake to aid in post-op healing. Pt does not wish to have Ensure but is willing to try magic cup. He is eager to eat.   UBW reported as 168 lb with no observed unintentional wt loss. Records indicate pt weighed 170 lb on 07/30/17 and 160 lb this admission (insignficant loss for time frame). Nutrition-Focused physical exam completed. Bilateral legs wrapped in ace bandages, unable to assess.   Medications reviewed and include: SS novolog, D5 in NS @ 50 ml/hr, LR @ 10 ml/hr Labs reviewed.   NUTRITION - FOCUSED PHYSICAL EXAM:    Most Recent Value  Orbital Region  No depletion  Upper Arm Region  Mild depletion  Thoracic and Lumbar Region  Unable to assess  Buccal Region  No depletion  Temple Region  Moderate depletion  Clavicle Bone Region  No depletion  Clavicle and Acromion Bone Region  No depletion  Scapular Bone Region  Unable to assess  Dorsal Hand  No depletion  Patellar Region  Unable to assess  Anterior Thigh Region  Unable to assess   Posterior Calf Region  Unable to assess  Edema (RD Assessment)  Unable to assess  Hair  Reviewed  Eyes  Reviewed  Mouth  Reviewed  Skin  Reviewed  Nails  Reviewed     Diet Order:   Diet Order            Diet Heart Room service appropriate? Yes; Fluid consistency: Thin  Diet effective now              EDUCATION NEEDS:   Education needs have been addressed  Skin:  Skin Assessment: Skin Integrity Issues: Skin Integrity Issues:: Incisions Incisions: bilateral legs   Last BM:  2/12  Height:   Ht Readings from Last 1 Encounters:  09/01/18 5\' 7"  (1.702 m)    Weight:   Wt Readings from Last 1 Encounters:  09/01/18 73 kg    Ideal Body Weight:  67.3 kg  BMI:  Body mass index is 25.21 kg/m.  Estimated Nutritional Needs:   Kcal:  1900-2100 kcal  Protein:  95-110 grams  Fluid:  >/= 1.9 L/day   Mariana Single RD, LDN Clinical Nutrition Pager # - [phone]

## 2018-09-01 NOTE — Op Note (Signed)
Orthopaedic Surgery Operative Note (CSN: [id number] ) Date of Surgery: 09/01/2018  Admit Date: 08/30/2018   Diagnoses: Pre-Op Diagnoses: Left subtrochanteric femur fracture Right supracondylar distal femur fracture   Post-Op Diagnosis: Same  Procedures: 1. CPT 27506-Cephalomedullary nailing of left femur fracture 2. CPT 52841-LKGM reduction internal fixation of right supracondylar distal femur fracture  Surgeons : Primary: Juanita Streight, Thomasene Lot, MD  Assistant: Patrecia Pace, PA-C  Location:[facility] 2   Anesthesia:General   Antibiotics: Ancef 2g preop   Tourniquet time: None  Estimated Blood WNUU:725 mL  Complications:None  Specimens:None   Implants: Implant Name Type Inv. Item Serial No. Manufacturer Lot No. LRB No. Used Action  HFN LH 130 DEG 9MM X 420MM - [id number] Nail HFN LH 130 DEG 9MM X 420MM  [employer](ORTH,TRAU,BIO,SG) [id number] Left 1 Implanted  HIP FRAC NAIL LAG SCR 10.5X100 - [id number] Orthopedic Implant HIP FRAC NAIL LAG SCR 10.5X100  [employer](ORTH,TRAU,BIO,SG) [id number] Left 1 Implanted  SCREW BONE CORTICAL 5.0X40 - [id number] Screw SCREW BONE CORTICAL 5.0X40  [employer](ORTH,TRAU,BIO,SG) [id number] Left 1 Implanted  SCREW BONE CORTICAL 5.0X44 - [id number] Screw SCREW BONE CORTICAL 5.0X44  [employer](ORTH,TRAU,BIO,SG) [id number] Left 1 Implanted  PLATE FEM DIST NCB PP 278MM - [id number] Plate PLATE FEM DIST NCB PP 278MM  [employer](ORTH,TRAU,BIO,SG)  Right 1 Implanted  SCREW NCB 5.0X75MM - [id number] Screw SCREW NCB 5.0X75MM  [employer](ORTH,TRAU,BIO,SG)  Right 1 Implanted  SCREW CORTICAL NCB 5.0X40 - [id number] Screw SCREW CORTICAL NCB 5.0X40  [employer](ORTH,TRAU,BIO,SG)  Right 1 Implanted  SCREW NCB 5.0X85MM - [id number] Screw SCREW NCB 5.0X85MM  [employer](ORTH,TRAU,BIO,SG)  Right 3 Implanted  SCREW CORTICAL NCB 5.0X90MM - [id number] Screw SCREW CORTICAL NCB 5.0X90MM  [employer](ORTH,TRAU,BIO,SG) [id number] Right 1 Implanted  SCREW NCB 4.0MX38M - [id number] Screw  SCREW NCB 4.0MX38M  [employer](ORTH,TRAU,BIO,SG)  Right 1 Implanted  SCREW NCB 4.0MX38M - [id number] Screw SCREW NCB 4.0MX38M  [employer](ORTH,TRAU,BIO,SG)  Right 1 Implanted  CAP LOCK NCB - [id number] Cap CAP LOCK NCB  [employer](ORTH,TRAU,BIO,SG)  Right 7 Implanted    Indications for Surgery: 85 year old male with a history of rectal cancer status post colostomy and diabetes with hypertension and chronic back pain who had a ground-level fall.  He had a left proximal femur fracture along with a right distal femur fracture.  Due to his previous ambulatory capabilities and the displacement of the fractures I feel that surgical fixation is warranted.  Risks and benefits were discussed with the patient and his family.  They agreed to proceed with surgery and consent was obtained.  Operative Findings: 1.  Cephalo-medullary nailing of left subtrochanteric femur fracture using [employer] Aff[employer]is nail 9 x 420 mm with 2 distal interlock screws 2.  Open reduction internal fixation of right supracondylar intercondylar distal femur fracture using [employer] NCB 12-hole distal femoral locking plate  Procedure: The patient was identified in the preoperative holding area. Consent was confirmed with the patient and their family and all questions were answered. The operative extremity was marked after confirmation with the patient. he was then brought back to the operating room by our anesthesia colleagues.  He was placed under general anesthetic and carefully transferred over to a radiolucent flat top table a bump was placed under his operative hip.  Bilateral lower extremities were then prepped and draped in usual sterile fashion.  Preoperative antibiotics were dosed.  A timeout was performed to verify the patient and the the extremities and the surgery.  The hip and knee were flexed over a triangle. AP  and lateral view were obtained to identify a correct incision and the skin and subcutaneous fat were  incised above the greater trochanter. A curved mayo scissors was used to spread inline with the hip abductors to the greater trochanter. A threaded guidepin was used to identify the correct starting point. AP and lateral views were used to advance the pin to just below the lesser trochanter. A entry reamer was used to enter the canal and the guidepin and reamer were removed. A finger was used to pass the ball-tipped guidewire down the canal of the femur.   AP and lateral fluoro was used to make sure the path of the guidewire was center-center in the distal part of the femur. This was seated down into the physeal scar. The length of the nail was measured and we chose a 436mm nail. The fracture was held reduced and I sequentially reamed from a 8.17mm to 10.1mm with adequate chatter at the end.  Percutaneous incision was made and a reduction clamp was used to reduce the fracture while I performed reaming.  I passed the nail down the center of the canal.  I lined up the proximal segment and I used the jig to place a threaded guidepin into the head neck segment.  I made sure that it was center center and then measured this and drilled and placed a 100 mm lag screw.  Excellent purchase was obtained.  I statically locked it through the nail.  Using perfect circle technique, two distal interlocks were placed in the nail.  The insertion handle was removed an final x-rays were obtained. The incisions were then irrigated and closed with 2-0 vicryl and 3-0 nylon.  I then turned my attention to the right femur.  Fluoroscopy was used to identify the fracture.  Due to the patient's age and medical comorbidities and a nondisplaced intra-articular extension I felt that I can treat this as an extra-articular fracture.  A lateral approach was made to the distal femur. It was taken down through the skin and the IT band was incised in line with the incision. The distal portion of the vastus lateralis was reflected off the IT  band and the distal articular block of the lateral femoral condyle was exposed.  An attempt was made to keep the fracture from being stripped or devitalized. I used fluoroscopy to identify the correct length plate to bridge the whole femoral shaft and proximal to the tip of the hip prothesis. I chose a 12-hole plate. The aiming arm was attached to the plate and slide submuscularly under the vastus to the proximal femur. The distal portion of the plate was placed flush against the lateral cortex of the distal articular block. A 3.3mm drill bit was used to position the proximal portion of the plate. A perfect lateral was obtained to show appropriate positioning of the plate.  The distal segment was drilled and 5.74mm cortical screws were placed in the distal fracture segment. A total of 3 screws were placed initially. A 5.0 mm screw was placed percutaneously in the shaft to reduce the coronal alignment. A total of three shaft screws were placed to allow for a significant working length and micromotion of the fracture. Locking caps were placed in the shaft screws except for the most proximal screw to prevent a stress riser. The targeting guide was removed. Two more screws were placed in the distal segment. Fluoroscopy was used to confirm adequate reduction of the fracture and appropriate position of the plate and  screws.  Locking caps were placed on the distal screws.  The incisions were irrigated with normal saline. A gram of vancomycin powder was placed in the incision around the plate. The IT band was closed with 0-vicryl. The skin was closed with 2-vicryl, 3-0 nylon. The incisions were dressed with a Mepilex dressing and sterile cast padding and the leg was wrapped with a ACE wrap. The patient was then transferred to the regular floor bed and taken to the PACU in stable condition.  Post Op Plan/Instructions: The patient will be weightbearing as tolerated bilateral lower extremities.  He will receive  postoperative Ancef.  I will recommend Lovenox for DVT prophylaxis.  He will mobilize with physical and Occupational Therapy.  I was present and performed the entire surgery.  Patrecia Pace, PA-C did assist me throughout the case. An assistant was necessary given the difficulty in approach, maintenance of reduction and ability to instrument the fracture.   Katha Hamming, MD [facility]

## 2018-09-02 ENCOUNTER — Encounter (HOSPITAL_COMMUNITY): Payer: Self-pay | Admitting: Student

## 2018-09-02 DIAGNOSIS — S72401A Unspecified fracture of lower end of right femur, initial encounter for closed fracture: Secondary | ICD-10-CM

## 2018-09-02 LAB — BASIC METABOLIC PANEL
Anion gap: 10 (ref 5–15)
BUN: 17 mg/dL (ref 8–23)
CO2: 20 mmol/L — ABNORMAL LOW (ref 22–32)
Calcium: 7.5 mg/dL — ABNORMAL LOW (ref 8.9–10.3)
Chloride: 109 mmol/L (ref 98–111)
Creatinine, Ser: 0.98 mg/dL (ref 0.61–1.24)
GFR calc non Af Amer: >60 mL/min (ref >60)
Glucose, Bld: 227 mg/dL — ABNORMAL HIGH (ref 70–99)
Potassium: 4.1 mmol/L (ref 3.5–5.1)
SODIUM: 139 mmol/L (ref 135–145)

## 2018-09-02 LAB — CBC
HCT: 21.1 % — ABNORMAL LOW (ref 39.0–52.0)
Hemoglobin: 7.1 g/dL — ABNORMAL LOW (ref 13.0–17.0)
MCH: 30.1 pg (ref 26.0–34.0)
MCHC: 33.6 g/dL (ref 30.0–36.0)
MCV: 89.4 fL (ref 80.0–100.0)
PLATELETS: 105 10*3/uL — AB (ref 150–400)
RBC: 2.36 MIL/uL — ABNORMAL LOW (ref 4.22–5.81)
RDW: 14 % (ref 11.5–15.5)
WBC: 8.2 10*3/uL (ref 4.0–10.5)
nRBC: 0 % (ref 0.0–0.2)

## 2018-09-02 LAB — URINE CULTURE

## 2018-09-02 LAB — GLUCOSE, CAPILLARY
GLUCOSE-CAPILLARY: 195 mg/dL — AB (ref 70–99)
Glucose-Capillary: 198 mg/dL — ABNORMAL HIGH (ref 70–99)
Glucose-Capillary: 212 mg/dL — ABNORMAL HIGH (ref 70–99)

## 2018-09-02 LAB — PREPARE RBC (CROSSMATCH)

## 2018-09-02 MED ORDER — SODIUM CHLORIDE 0.9% IV SOLUTION
Freq: Once | INTRAVENOUS | Status: AC
  Administered 2018-09-02: 09:00:00 via INTRAVENOUS

## 2018-09-02 MED ORDER — FUROSEMIDE 10 MG/ML IJ SOLN
20.0000 mg | Freq: Once | INTRAMUSCULAR | Status: AC
  Administered 2018-09-02: 20 mg via INTRAVENOUS
  Filled 2018-09-02: qty 2

## 2018-09-02 MED ORDER — HYDROMORPHONE HCL 1 MG/ML IJ SOLN: 0.5000 mg | INTRAMUSCULAR | Status: DC | PRN

## 2018-09-02 NOTE — Progress Notes (Signed)
Orthopaedic Trauma Progress Note  S: Patient doing well, pain has been pretty well controlled. Does have any complainst this morning. Patient will receive 1 unit of PRBCs this morning then will likely be transferred to progressive care unit. Plan to work with therapies today  O:  Vitals:   09/02/18 0600 09/02/18 0700  BP: (!) 123/52 (!) 124/57  Pulse: 91 88  Resp: 17 14  Temp:    SpO2: 100% 100%   General - Sitting up in bed eating breakfast, no acute distress.   Cardiac - Heart regular rate and rhythm  Lungs - clear to auscultation anterior lung fields bilaterally  LLE - Dressing in place, clean, dry, intact. Tenderness to palpation of hip and lateral thigh. Able to get about 20 degrees of knee flexion without significant discomfort. Good ankle ROM. Sensation and motor function intact. Neurovascularly intact  RLE - Dressing in place, clean, dry, intact. Tenderness to palpation of the knee. Able to get about 20 degrees of knee flexion without significant discomfort. Good ankle ROM. Sensation and motor function intact. Neurovascularly intact   Imaging: Stable post op imaging  Labs:  Results for orders placed or performed during the hospital encounter of 08/30/18 (from the past 24 hour(s))  Glucose, capillary     Status: Abnormal   Collection Time: 09/01/18  1:35 PM  Result Value Ref Range   Glucose-Capillary 155 (H) 70 - 99 mg/dL   Comment 1 Notify RN    Comment 2 Document in Chart   Glucose, capillary     Status: Abnormal   Collection Time: 09/01/18  3:52 PM  Result Value Ref Range   Glucose-Capillary 182 (H) 70 - 99 mg/dL   Comment 1 Notify RN    Comment 2 Document in Chart   Glucose, capillary     Status: Abnormal   Collection Time: 09/01/18 10:16 PM  Result Value Ref Range   Glucose-Capillary 272 (H) 70 - 99 mg/dL  CBC     Status: Abnormal   Collection Time: 09/02/18  3:55 AM  Result Value Ref Range   WBC 8.2 4.0 - 10.5 K/uL   RBC 2.36 (L) 4.22 - 5.81 MIL/uL   Hemoglobin 7.1 (L) 13.0 - 17.0 g/dL   HCT 21.1 (L) 39.0 - 52.0 %   MCV 89.4 80.0 - 100.0 fL   MCH 30.1 26.0 - 34.0 pg   MCHC 33.6 30.0 - 36.0 g/dL   RDW 14.0 11.5 - 15.5 %   Platelets 105 (L) 150 - 400 K/uL   nRBC 0.0 0.0 - 0.2 %  Basic metabolic panel     Status: Abnormal   Collection Time: 09/02/18  3:55 AM  Result Value Ref Range   Sodium 139 135 - 145 mmol/L   Potassium 4.1 3.5 - 5.1 mmol/L   Chloride 109 98 - 111 mmol/L   CO2 20 (L) 22 - 32 mmol/L   Glucose, Bld 227 (H) 70 - 99 mg/dL   BUN 17 8 - 23 mg/dL   Creatinine, Ser 0.98 0.61 - 1.24 mg/dL   Calcium 7.5 (L) 8.9 - 10.3 mg/dL   GFR calc non Af Amer >60 >60 mL/min   GFR calc Af Amer >60 >60 mL/min   Anion gap 10 5 - 15  Glucose, capillary     Status: Abnormal   Collection Time: 09/02/18  7:23 AM  Result Value Ref Range   Glucose-Capillary 195 (H) 70 - 99 mg/dL   Comment 1 Notify RN    Comment 2  Document in Chart     Assessment: 85 year old s/p mechanical fall  Injuries: 1. Left subtrochanteric femur fracture s/p cepholmeduallry nail on 09/01/18 2. Right supracondylar distal femur fracture s/p ORIF on 09/01/18  Weightbearing: WBAT BLE  Insicional and dressing care: Dressings are clean, dry, intact. Will plan to change tomorrow  Orthopedic device(s): None right now  CV/Blood loss: acute blood loss anemia, Hgb 7.1 this AM. Hemodynamically stable. Will receive 1 unit PRBCs  Pain management: 1. Tylenol 650 mg q 6 hours scheduled 2. Dilaudid 0.5-1 mg q 3 hours PRN 3. Oxycodone 5-10 mg q 4 hours PRN  VTE prophylaxis: okay to start Lovenox 40 mg today from orthopedic perspective, will leave this up to trauma team  ID: Ancef 2 g postoperatively   Foley/Lines: Foley catheter in place, receiving IVFs  Medical co-morbidities: HTN, DM, HLD, hx of rectal CA, chronic back pain  Impediments to Fracture Healing: Diabetes  Dispo: PT/OT eval, dispo pending  Follow - up plan: 2 weeks   Shona Needles, MD [facility] [phone] (phone)

## 2018-09-02 NOTE — Progress Notes (Signed)
Attempted twice to call the patient's spouse to notify of transfer, no answer and not voicemail set up.

## 2018-09-02 NOTE — Evaluation (Signed)
Occupational Therapy Evaluation Patient Details Name: Ralph Yang MRN: [id number] DOB: [birth year] Today's Date: 09/02/2018    History of Present Illness Pt is a 85 y.o.white male with history notable for rectal cancer status post colostomy, and diabetes and hypertension, chronic back pain on chronic tramadol treatment who presented to [facility] after sustaining a ground-level fall while at home.  Patient was found to have left hip fracture and right distal femur fracture, hypotensive. S/p ORIF R distal femur 2/12, IM nail L proximal femur 2/12. WBAT B LEs.    Clinical Impression   Patient supine in bed and agreeable to PT/OT session. PTA patinet reports independent with mobility using cane, independent ADLs (sponge bathing) and has 24/7 support from family.  Patient currently admitted for above and limited by problem list below, including pain, generalized weakness, decreased activity tolerance and impaired balance. Further assessment of cognition recommended. Patient currently requires min to setup assist for UB ADLs and grooming, total assist for LB ADls, mod to max assist for bed mobility and unable to complete transfers today.  Believe patient will best benefit from intensive CIR level rehab in order to optimize independence and return to PLOF.  Will follow.   BP: supine 135/62, EOB 139/71, Supine after activity 148/75    Follow Up Recommendations  CIR;Supervision/Assistance - 24 hour    Equipment Recommendations  3 in 1 bedside commode(drop arm)    Recommendations for Other Services Rehab consult     Precautions / Restrictions Precautions Precautions: Fall Restrictions Weight Bearing Restrictions: Yes RLE Weight Bearing: Weight bearing as tolerated LLE Weight Bearing: Weight bearing as tolerated      Mobility Bed Mobility Overal bed mobility: Needs Assistance Bed Mobility: Supine to Sit;Sit to Supine     Supine to sit: Mod assist;+2 for safety/equipment;HOB  elevated Sit to supine: Max assist;+2 for safety/equipment;+2 for physical assistance;HOB elevated   General bed mobility comments: transitions to EOB with assist for BLEs and min assist for trunk; transitions to supine with max assist for LEs and trunk support   Transfers Overall transfer level: Needs assistance Equipment used: Rolling walker (2 wheeled) Transfers: Sit to/from Stand Sit to Stand: (unable- attempted )         General transfer comment: attempted sit to stand at EOB, cueing for safety and techniques given max assist +2 able to ascend hips minimally from elevated EOB     Balance Overall balance assessment: Needs assistance Sitting-balance support: No upper extremity supported;Feet supported Sitting balance-Leahy Scale: Fair         Standing balance comment: unable                           ADL either performed or assessed with clinical judgement   ADL Overall ADL's : Needs assistance/impaired Eating/Feeding: Set up;Sitting   Grooming: Minimal assistance;Sitting   Upper Body Bathing: Minimal assistance;Sitting   Lower Body Bathing: Maximal assistance;+2 for physical assistance;+2 for safety/equipment;Sitting/lateral leans   Upper Body Dressing : Set up;Sitting   Lower Body Dressing: Total assistance;Sitting/lateral leans;Bed level;+2 for physical assistance;+2 for safety/equipment     Toilet Transfer Details (indicate cue type and reason): deferred           General ADL Comments: patinet limited by pain, activity tolerance, weakness      Vision Baseline Vision/History: Wears glasses Wears Glasses: At all times Patient Visual Report: No change from baseline Vision Assessment?: No apparent visual deficits  Perception     Praxis      Pertinent Vitals/Pain Pain Assessment: Faces Faces Pain Scale: Hurts whole lot Pain Location: B hips with mobility Pain Descriptors / Indicators: Discomfort;Grimacing;Operative site guarding Pain  Intervention(s): Limited activity within patient's tolerance;Repositioned;Monitored during session     Hand Dominance Left   Extremity/Trunk Assessment Upper Extremity Assessment Upper Extremity Assessment: Generalized weakness(edema with noted baseline B contracture index fingers MCP )   Lower Extremity Assessment Lower Extremity Assessment: Defer to PT evaluation       Communication Communication Communication: No difficulties   Cognition Arousal/Alertness: Awake/alert(initally lethargic but improves ) Behavior During Therapy: Flat affect Overall Cognitive Status: Impaired/Different from baseline Area of Impairment: Following commands;Memory;Problem solving                     Memory: Decreased recall of precautions;Decreased short-term memory Following Commands: Follows one step commands consistently;Follows one step commands with increased time     Problem Solving: Slow processing;Decreased initiation;Difficulty sequencing;Requires verbal cues;Requires tactile cues General Comments: further cognitive assessment recommended, patient reports of PLOF varies from chart review- requires increased time to follow commands and initate tasks    General Comments  VSS    Exercises     Shoulder Instructions      Home Living Family/patient expects to be discharged to:: Private residence Living Arrangements: Spouse/significant other;Children(son) Available Help at Discharge: Family;Available 24 hours/day Type of Home: House Home Access: Stairs to enter [employer] of Steps: 2 Entrance Stairs-Rails: Right;Left;Can reach both Home Layout: One level     Bathroom Shower/Tub: (sponge bathes )   Bathroom Toilet: Standard     Home Equipment: Walker - 4 wheels;Wheelchair - power;Shower seat;Cane - single point          Prior Functioning/Environment Level of Independence: Needs assistance  Gait / Transfers Assistance Needed: per chart using RW, patient  reports using cane for mobility  ADL's / Homemaking Assistance Needed: reports independent with ADLs    Comments: reports sponge bathing         OT Problem List: Decreased strength;Decreased activity tolerance;Impaired balance (sitting and/or standing);Decreased range of motion;Decreased safety awareness;Decreased knowledge of use of DME or AE;Pain;Increased edema;Decreased knowledge of precautions;Decreased coordination      OT Treatment/Interventions: Self-care/ADL training;Therapeutic exercise;DME and/or AE instruction;Therapeutic activities;Patient/family education;Balance training    OT Goals(Current goals can be found in the care plan section) Acute Rehab OT Goals Patient Stated Goal: to get better OT Goal Formulation: With patient Time For Goal Achievement: 09/16/18 Potential to Achieve Goals: Good  OT Frequency: Min 2X/week   Barriers to D/C:            Co-evaluation              AM-PAC OT "6 Clicks" Daily Activity     Outcome Measure Help from another person eating meals?: None Help from another person taking care of personal grooming?: A Little Help from another person toileting, which includes using toliet, bedpan, or urinal?: Total Help from another person bathing (including washing, rinsing, drying)?: A Lot Help from another person to put on and taking off regular upper body clothing?: A Little Help from another person to put on and taking off regular lower body clothing?: Total 6 Click Score: 14   End of Session Equipment Utilized During Treatment: Gait belt Nurse Communication: Mobility status  Activity Tolerance: Patient tolerated treatment well Patient left: in bed;with call bell/phone within reach;with bed alarm set;with nursing/sitter in room  OT Visit Diagnosis:  Other abnormalities of gait and mobility (R26.89);Pain;Muscle weakness (generalized) (M62.81) Pain - Right/Left: (bil) Pain - part of body: Leg;Hip                Time: 1131-1203 OT  Time Calculation (min): 32 min Charges:  OT General Charges $OT Visit: 1 Visit OT Evaluation $OT Eval Moderate Complexity: [place], OT Acute Rehabilitation Services Pager [phone] Office [phone]   Delight Stare 09/02/2018, 12:44 PM

## 2018-09-02 NOTE — Progress Notes (Signed)
1048 attempted to call report. Nurse in progression.  1137 attempted to call report. Nurse discharging a patient.

## 2018-09-02 NOTE — Progress Notes (Signed)
Rehab Admissions Coordinator Note:  Patient was screened by Retta Diones for appropriateness for an Inpatient Acute Rehab Consult.  At this time, we are recommending Inpatient Rehab consult.  Jodell Cipro M 09/02/2018, 1:20 PM  I can be reached at [phone].

## 2018-09-02 NOTE — Anesthesia Postprocedure Evaluation (Signed)
Anesthesia Post Note  Patient: Ralph Yang  Procedure(s) Performed: INTRAMEDULLARY (IM) NAIL INTERTROCHANTRIC (Left ) OPEN REDUCTION INTERNAL FIXATION (ORIF) DISTAL FEMUR FRACTURE (Right )     Patient location during evaluation: PACU Anesthesia Type: General Level of consciousness: awake and alert Pain management: pain level controlled Vital Signs Assessment: post-procedure vital signs reviewed and stable Respiratory status: spontaneous breathing, nonlabored ventilation and respiratory function stable Cardiovascular status: blood pressure returned to baseline and stable Postop Assessment: no apparent nausea or vomiting Anesthetic complications: no    Last Vitals:  Vitals:   09/02/18 0500 09/02/18 0600  BP: (!) 102/57 (!) 123/52  Pulse: 88 91  Resp: 18 17  Temp:    SpO2: 97% 100%    Last Pain:  Vitals:   09/02/18 0400  TempSrc: Oral  PainSc:                  Lynda Rainwater

## 2018-09-02 NOTE — Progress Notes (Signed)
Patient ID: Ralph Yang, male   DOB: 1934-05-02, 85 y.o.   MRN: [id number] 1 Day Post-Op  Subjective: Denies significant pain Per RN BP was soft after he received dilaudid  Objective: Vital signs in last 24 hours: Temp:  [97.5 F (36.4 C)-99.6 F (37.6 C)] 99.3 F (37.4 C) (02/13 0400) Pulse Rate:  [81-111] 93 (02/13 0800) Resp:  [8-20] 19 (02/13 0800) BP: (91-147)/(44-92) 113/58 (02/13 0800) SpO2:  [97 %-100 %] 99 % (02/13 0800) Weight:  [73 kg] 73 kg (02/12 0927) Last BM Date: 09/01/18  Intake/Output from previous day: 02/12 0701 - 02/13 0700 In: 3210.2 [P.O.:100; I.V.:2535.2; IV Piggyback:450.1] Out: 1231 [Urine:631; Blood:600] Intake/Output this shift: Total I/O In: 48.2 [I.V.:48.2] Out: -   General appearance: alert and cooperative Resp: clear to auscultation bilaterally Cardio: regular rate and rhythm GI: soft, NT Extremities: BLE ace, feet warm Neuro: alert and F/C  Lab Results: CBC  Recent Labs    09/01/18 0451 09/02/18 0355  WBC 10.5 8.2  HGB 10.9* 7.1*  HCT 33.3* 21.1*  PLT 114* 105*   BMET Recent Labs    09/01/18 0451 09/02/18 0355  NA 139 139  K 4.2 4.1  CL 106 109  CO2 23 20*  GLUCOSE 145* 227*  BUN 14 17  CREATININE 0.85 0.98  CALCIUM 8.0* 7.5*   PT/INR Recent Labs    08/30/18 2316  LABPROT 15.3*  INR 1.23   Anti-infectives: Anti-infectives (From admission, onward)   Start     Dose/Rate Route Frequency Ordered Stop   09/01/18 1800  ceFAZolin (ANCEF) IVPB 2g/100 mL premix     2 g 200 mL/hr over 30 Minutes Intravenous Every 8 hours 09/01/18 1505 09/02/18 2159   09/01/18 1118  vancomycin (VANCOCIN) powder  Status:  Discontinued       As needed 09/01/18 1118 09/01/18 1328   09/01/18 1116  vancomycin (VANCOCIN) powder  Status:  Discontinued       As needed 09/01/18 1117 09/01/18 1328   08/31/18 1330  ceFAZolin (ANCEF) IVPB 2g/100 mL premix     2 g 200 mL/hr over 30 Minutes Intravenous To [facility] 08/30/18 2308  09/01/18 1055      Assessment/Plan: Fall R distal femur FX - S/P ORIF 2/12 by Dr. Doreatha Martin, WBAT L proximal femur FX - S?P IM nail 2/12 by Dr. Doreatha Martin, WBAT ABL anemia - TF 1u PRBC  CV - BP soft after dialudid, TF as below AKI - resolved DM - SSI HX colon cancer with colostomy and chronic peristomal hernia FEN - diet, KVO IVF, lasix after TF VTE - no Lovenox until Hb stable Dispo - to 4NP, PT/OT  LOS: 3 days    Georganna Skeans, MD, MPH, FACS Trauma: [phone] General Surgery: [phone]  09/02/2018

## 2018-09-02 NOTE — Evaluation (Signed)
Physical Therapy Evaluation Patient Details Name: Ralph Yang MRN: [id number] DOB: 01-Aug-1933 Today's Date: 09/02/2018   History of Present Illness  Pt is a 85 y.o.[place] Hoeger male with history notable for rectal cancer status post colostomy, and diabetes and hypertension, chronic back pain on chronic tramadol treatment who presented to [facility] after sustaining a ground-level fall while at home.  Patient was found to have left hip fracture and right distal femur fracture, hypotensive. S/p ORIF R distal femur 2/12, IM nail L proximal femur 2/12. WBAT B LEs.   Clinical Impression  Pt admitted with above diagnosis. Pt currently with functional limitations due to the deficits listed below (see PT Problem List). Pt was able to sit EOB for 10 minutes.  Could not stand with +2 assist but very motivated and tried hard. Will progress pt as able.  Continue PT and pt is a good rehab candidate.   Pt will benefit from skilled PT to increase their independence and safety with mobility to allow discharge to the venue listed below.      Follow Up Recommendations CIR;Supervision/Assistance - 24 hour    Equipment Recommendations  Other (comment)(TBA)    Recommendations for Other Services Rehab consult     Precautions / Restrictions Precautions Precautions: Fall Restrictions Weight Bearing Restrictions: Yes RLE Weight Bearing: Weight bearing as tolerated LLE Weight Bearing: Weight bearing as tolerated      Mobility  Bed Mobility Overal bed mobility: Needs Assistance Bed Mobility: Supine to Sit;Sit to Supine     Supine to sit: Mod assist;+2 for safety/equipment;HOB elevated Sit to supine: Max assist;+2 for safety/equipment;+2 for physical assistance;HOB elevated   General bed mobility comments: transitions to EOB with mod assist for BLEs and min assist for trunk; transitions to supine with max assist for LEs and trunk support   Transfers Overall transfer level: Needs  assistance Equipment used: Rolling walker (2 wheeled) Transfers: Sit to/from Stand Sit to Stand: (unable- attempted )         General transfer comment: attempted sit to stand at EOB, cueing for safety and techniques given max assist +2 able to ascend hips minimally from elevated EOB   Ambulation/Gait                Stairs            Wheelchair Mobility    Modified Rankin (Stroke Patients Only)       Balance Overall balance assessment: Needs assistance Sitting-balance support: No upper extremity supported;Feet supported Sitting balance-Leahy Scale: Fair Sitting balance - Comments: Sat EOB x 10 minutes with supervision       Standing balance comment: unable                             Pertinent Vitals/Pain Pain Assessment: Faces Faces Pain Scale: Hurts whole lot Pain Location: B hips with mobility Pain Descriptors / Indicators: Discomfort;Grimacing;Operative site guarding Pain Intervention(s): Limited activity within patient's tolerance;Monitored during session;Repositioned    Home Living Family/patient expects to be discharged to:: Private residence Living Arrangements: Spouse/significant other;Children(son) Available Help at Discharge: Family;Available 24 hours/day Type of Home: House Home Access: Stairs to enter Entrance Stairs-Rails: Right;Left;Can reach both Entrance Stairs-Number of Steps: 2 Home Layout: One level Home Equipment: Walker - 4 wheels;Wheelchair - power;Shower seat;Cane - single point      Prior Function Level of Independence: Needs assistance   Gait / Transfers Assistance Needed: per chart using RW, patient reports using  cane for mobility   ADL's / Homemaking Assistance Needed: reports independent with ADLs   Comments: reports sponge bathing      Hand Dominance   Dominant Hand: Left    Extremity/Trunk Assessment   Upper Extremity Assessment Upper Extremity Assessment: Defer to OT evaluation    Lower  Extremity Assessment Lower Extremity Assessment: RLE deficits/detail;LLE deficits/detail RLE Deficits / Details: grossly 2-/5, ROM knee to 75 degrees flexion LLE Deficits / Details: grossly 2-/5, ROM to 70 degrees flexion    Cervical / Trunk Assessment Cervical / Trunk Assessment: Kyphotic  Communication   Communication: No difficulties  Cognition Arousal/Alertness: Awake/alert(initally lethargic but improves ) Behavior During Therapy: Flat affect Overall Cognitive Status: Impaired/Different from baseline Area of Impairment: Following commands;Memory;Problem solving                     Memory: Decreased recall of precautions;Decreased short-term memory Following Commands: Follows one step commands consistently;Follows one step commands with increased time     Problem Solving: Slow processing;Decreased initiation;Difficulty sequencing;Requires verbal cues;Requires tactile cues General Comments: further cognitive assessment recommended, patient reports of PLOF varies from chart review- requires increased time to follow commands and initate tasks       General Comments General comments (skin integrity, edema, etc.): HR up to 138 bpm wtih activity    Exercises General Exercises - Lower Extremity Long Arc Quad: AROM;Both;5 reps;Seated   Assessment/Plan    PT Assessment Patient needs continued PT services  PT Problem List Decreased activity tolerance;Decreased range of motion;Decreased strength;Decreased balance;Decreased mobility;Decreased knowledge of use of DME;Decreased safety awareness;Decreased knowledge of precautions;Pain       PT Treatment Interventions DME instruction;Gait training;Functional mobility training;Therapeutic activities;Therapeutic exercise;Balance training;Patient/family education    PT Goals (Current goals can be found in the Care Plan section)  Acute Rehab PT Goals Patient Stated Goal: to get better PT Goal Formulation: With patient Time For  Goal Achievement: 09/16/18 Potential to Achieve Goals: Good    Frequency Min 5X/week   Barriers to discharge Decreased caregiver support(wife can't lift, son with back problems per pt)      Co-evaluation PT/OT/SLP Co-Evaluation/Treatment: Yes Reason for Co-Treatment: Complexity of the patient's impairments (multi-system involvement) PT goals addressed during session: Mobility/safety with mobility OT goals addressed during session: ADL's and self-care;Other (comment)(mobility)       AM-PAC PT "6 Clicks" Mobility  Outcome Measure Help needed turning from your back to your side while in a flat bed without using bedrails?: A Lot Help needed moving from lying on your back to sitting on the side of a flat bed without using bedrails?: A Lot Help needed moving to and from a bed to a chair (including a wheelchair)?: Total Help needed standing up from a chair using your arms (e.g., wheelchair or bedside chair)?: Total Help needed to walk in hospital room?: Total Help needed climbing 3-5 steps with a railing? : Total 6 Click Score: 8    End of Session Equipment Utilized During Treatment: Gait belt Activity Tolerance: Patient limited by fatigue;Patient limited by pain Patient left: in bed;with call bell/phone within reach;with bed alarm set Nurse Communication: Mobility status PT Visit Diagnosis: Unsteadiness on feet (R26.81);Muscle weakness (generalized) (M62.81);Pain Pain - Right/Left: (bil) Pain - part of body: Knee;Hip;Leg;Ankle and joints of foot    Time: 1129-1202 PT Time Calculation (min) (ACUTE ONLY): 33 min   Charges:   PT Evaluation $PT Eval Moderate Complexity: 1 Mod          Emberlin Verner,PT  Acute Rehabilitation Services Pager:  [phone]  Office:  [place] 09/02/2018, 2:13 PM

## 2018-09-02 NOTE — Consult Note (Addendum)
Physical Medicine and Rehabilitation Consult Reason for Consult: Decreased functional mobility Referring Physician: Trauma services   HPI: Ralph Yang is a 85 y.o.right handed male with history of diabetes mellitus, hypertension,chronic back pain, rectal cancer status post colostomy. Patient lives with spouse and son. Ambulates with a rolling walker/Cane. One level home with 2 steps to entry. Son can assist but does have back problems. Presented  08/30/2018 to [facility] hospital after a fall in the bedroom. Denied loss of consciousness. X-rays and imaging revealed left subtrochanteric femur fracture as well as right supracondylar distal femur fracture. Underwent nailing of left femur fracture and ORIF of right supracondylar femur fracture to 09/01/2018 per Dr. Doreatha Martin.Weightbearing as tolerated bilateral lower extremities. Hospital course pain management. Acute blood loss anemia 7.1 planning transfusion. Therapy evaluations completed with recommendations of physical medicine rehabilitation consult.   Review of Systems  Constitutional: Negative for chills and fever.  HENT: Negative for hearing loss.   Eyes: Negative for blurred vision and double vision.  Respiratory: Negative for cough.   Cardiovascular: Positive for leg swelling. Negative for chest pain and palpitations.  Gastrointestinal: Positive for constipation. Negative for nausea.  Genitourinary: Positive for urgency. Negative for dysuria, flank pain and hematuria.  Musculoskeletal: Positive for back pain, falls and myalgias.  Skin: Negative for rash.  Neurological: Negative for seizures.  All other systems reviewed and are negative.  Past Medical History:  Diagnosis Date  . Arthritis   . Cancer ([place])    rectal  . Diabetes mellitus without complication ([place])   . Hypertension    Past Surgical History:  Procedure Laterality Date  . BACK SURGERY    . CHOLECYSTECTOMY    . COLOSTOMY    . COLOSTOMY    .  INTRAMEDULLARY (IM) NAIL INTERTROCHANTERIC Left 09/01/2018   Procedure: INTRAMEDULLARY (IM) NAIL INTERTROCHANTRIC;  Surgeon: Shona Needles, MD;  Location: [place];  Service: Orthopedics;  Laterality: Left;  . ORIF FEMUR FRACTURE Right 09/01/2018   Procedure: OPEN REDUCTION INTERNAL FIXATION (ORIF) DISTAL FEMUR FRACTURE;  Surgeon: Shona Needles, MD;  Location: [place];  Service: Orthopedics;  Laterality: Right;   History reviewed. No pertinent family history. Social History:  reports that he has never smoked. He has never used smokeless tobacco. He reports that he does not drink alcohol or use drugs. Allergies:  Allergies  Allergen Reactions  . Sulfa Antibiotics Nausea Only   Medications Prior to Admission  Medication Sig Dispense Refill  . acetaminophen (TYLENOL) 325 MG tablet Take 650 mg by mouth 2 (two) times daily.    . Cyanocobalamin (VITAMIN B 12 PO) Take 500 mcg by mouth daily. GUMMIES    . lisinopril (PRINIVIL,ZESTRIL) 5 MG tablet Take 5 mg by mouth daily.  3  . metFORMIN (GLUCOPHAGE) 500 MG tablet Take 500 mg by mouth 2 (two) times daily with a meal.     . tamsulosin (FLOMAX) 0.4 MG CAPS capsule Take 1 capsule (0.4 mg total) by mouth daily. (Patient taking differently: Take 0.8 mg by mouth daily. ) 10 capsule 0  . traMADol (ULTRAM) 50 MG tablet Take 50 mg by mouth every 4 (four) hours as needed for moderate pain.     . Vitamin D, Cholecalciferol, 1000 UNITS TABS Take 1,000 Units by mouth every morning.     Roseanne Kaufman Peru-Castor Oil (VENELEX) OINT Apply to sacrum and bilateral buttocks every shift      Home: Home Living Family/patient expects to be discharged to:: Private residence Living  Arrangements: Spouse/significant other, Children(son) Available Help at Discharge: Family, Available 24 hours/day Type of Home: House Home Access: Stairs to enter [employer] of Steps: 2 Entrance Stairs-Rails: Right, Left, Can reach both Home Layout: One level Bathroom Shower/Tub:  (sponge bathes ) Bathroom Toilet: Standard Home Equipment: Environmental consultant - 4 wheels, Wheelchair - power, Civil engineer, contracting, Radio producer - single point  Functional History: Prior Function Level of Independence: Needs assistance Gait / Transfers Assistance Needed: per chart using RW, patient reports using cane for mobility  ADL's / Homemaking Assistance Needed: reports independent with ADLs  Comments: reports sponge bathing  Functional Status:  Mobility: Bed Mobility Overal bed mobility: Needs Assistance Bed Mobility: Supine to Sit, Sit to Supine Supine to sit: Mod assist, +2 for safety/equipment, HOB elevated Sit to supine: Max assist, +2 for safety/equipment, +2 for physical assistance, HOB elevated General bed mobility comments: transitions to EOB with assist for BLEs and min assist for trunk; transitions to supine with max assist for LEs and trunk support  Transfers Overall transfer level: Needs assistance Equipment used: Rolling walker (2 wheeled) Transfers: Sit to/from Stand Sit to Stand: (unable- attempted ) General transfer comment: attempted sit to stand at EOB, cueing for safety and techniques given max assist +2 able to ascend hips minimally from elevated EOB       ADL: ADL Overall ADL's : Needs assistance/impaired Eating/Feeding: Set up, Sitting Grooming: Minimal assistance, Sitting Upper Body Bathing: Minimal assistance, Sitting Lower Body Bathing: Maximal assistance, +2 for physical assistance, +2 for safety/equipment, Sitting/lateral leans Upper Body Dressing : Set up, Sitting Lower Body Dressing: Total assistance, Sitting/lateral leans, Bed level, +2 for physical assistance, +2 for safety/equipment Toilet Transfer Details (indicate cue type and reason): deferred General ADL Comments: patinet limited by pain, activity tolerance, weakness   Cognition: Cognition Overall Cognitive Status: Impaired/Different from baseline Orientation Level: Oriented X4 Cognition Arousal/Alertness:  Awake/alert(initally lethargic but improves ) Behavior During Therapy: Flat affect Overall Cognitive Status: Impaired/Different from baseline Area of Impairment: Following commands, Memory, Problem solving Memory: Decreased recall of precautions, Decreased short-term memory Following Commands: Follows one step commands consistently, Follows one step commands with increased time Problem Solving: Slow processing, Decreased initiation, Difficulty sequencing, Requires verbal cues, Requires tactile cues General Comments: further cognitive assessment recommended, patient reports of PLOF varies from chart review- requires increased time to follow commands and initate tasks   Blood pressure 113/85, pulse 95, temperature 98.1 F (36.7 C), temperature source Oral, resp. rate 11, height 5\' 7"  (1.702 m), weight 73 kg, SpO2 100 %. Physical Exam  HENT:  Head: Normocephalic.  Eyes: EOM are normal.  Neck: Normal range of motion. Neck supple. No thyromegaly present.  Cardiovascular: Normal rate and regular rhythm.  Respiratory: Effort normal and breath sounds normal. No respiratory distress.  GI: Soft. Bowel sounds are normal. He exhibits no distension.  Neurological:  Lethargic but arousable. He provides his name, age and date of birth. Follows commands  Motor strength is 4/5 bilateral deltoid bicep tricep grip Patient has bilateral severe Dupuytren's contractures of the second digits Lower extremity strength pain inhibits movement less than antigravity. States that he has sensation bilateral toes  Results for orders placed or performed during the hospital encounter of 08/30/18 (from the past 24 hour(s))  Glucose, capillary     Status: Abnormal   Collection Time: 09/01/18  3:52 PM  Result Value Ref Range   Glucose-Capillary 182 (H) 70 - 99 mg/dL   Comment 1 Notify RN    Comment 2 Document in  Chart   Glucose, capillary     Status: Abnormal   Collection Time: 09/01/18 10:16 PM  Result Value Ref  Range   Glucose-Capillary 272 (H) 70 - 99 mg/dL  CBC     Status: Abnormal   Collection Time: 09/02/18  3:55 AM  Result Value Ref Range   WBC 8.2 4.0 - 10.5 K/uL   RBC 2.36 (L) 4.22 - 5.81 MIL/uL   Hemoglobin 7.1 (L) 13.0 - 17.0 g/dL   HCT 21.1 (L) 39.0 - 52.0 %   MCV 89.4 80.0 - 100.0 fL   MCH 30.1 26.0 - 34.0 pg   MCHC 33.6 30.0 - 36.0 g/dL   RDW 14.0 11.5 - 15.5 %   Platelets 105 (L) 150 - 400 K/uL   nRBC 0.0 0.0 - 0.2 %  Basic metabolic panel     Status: Abnormal   Collection Time: 09/02/18  3:55 AM  Result Value Ref Range   Sodium 139 135 - 145 mmol/L   Potassium 4.1 3.5 - 5.1 mmol/L   Chloride 109 98 - 111 mmol/L   CO2 20 (L) 22 - 32 mmol/L   Glucose, Bld 227 (H) 70 - 99 mg/dL   BUN 17 8 - 23 mg/dL   Creatinine, Ser 0.98 0.61 - 1.24 mg/dL   Calcium 7.5 (L) 8.9 - 10.3 mg/dL   GFR calc non Af Amer >60 >60 mL/min   GFR calc Af Amer >60 >60 mL/min   Anion gap 10 5 - 15  Glucose, capillary     Status: Abnormal   Collection Time: 09/02/18  7:23 AM  Result Value Ref Range   Glucose-Capillary 195 (H) 70 - 99 mg/dL   Comment 1 Notify RN    Comment 2 Document in Chart   Prepare RBC     Status: None   Collection Time: 09/02/18  8:15 AM  Result Value Ref Range   Order Confirmation      ORDER PROCESSED BY BLOOD BANK BLOOD ALREADY AVAILABLE Performed at [facility], [address], [place], [place] [address]    Dg C-arm 1-60 Min  Result Date: 09/01/2018 CLINICAL DATA:  85 year old male status post fall undergoing right femur ORIF. EXAM: DG C-ARM 61-120 MIN; RIGHT FEMUR 2 VIEWS COMPARISON:  Right knee series 08/30/2018. FINDINGS: Six intraoperative fluoroscopic spot views of the mid and distal right femur. Comminuted and impacted distal right femur metadiaphysis fracture redemonstrated. These images demonstrate placement of lateral plate and multiple proximal and distal cortical screws traversing the fracture near anatomic alignment on the final images. FLUOROSCOPY TIME:  5  minutes 6 seconds IMPRESSION: ORIF distal right femur with no adverse features. Electronically Signed   By: Genevie Ann M.D.   On: 09/01/2018 14:42   Dg Femur Min 2 Views Left  Result Date: 09/01/2018 CLINICAL DATA:  Intraoperative imaging for fixation of a left femur fracture. EXAM: LEFT FEMUR 2 VIEWS COMPARISON:  None. FINDINGS: Six fluoroscopic intraoperative spot views of the left femur are provided. Images demonstrate placement of an intramedullary nail with a dynamic right hip screw and 2 distal screws for fixation of a subtrochanteric fracture. Position and alignment are markedly improved. No new abnormality. IMPRESSION: Intraoperative imaging for fixation of a left femur fracture Electronically Signed   By: Inge Rise M.D.   On: 09/01/2018 14:37   Dg Femur, Min 2 Views Right  Result Date: 09/01/2018 CLINICAL DATA:  85 year old male status post fall undergoing right femur ORIF. EXAM: DG C-ARM 61-120 MIN; RIGHT  FEMUR 2 VIEWS COMPARISON:  Right knee series 08/30/2018. FINDINGS: Six intraoperative fluoroscopic spot views of the mid and distal right femur. Comminuted and impacted distal right femur metadiaphysis fracture redemonstrated. These images demonstrate placement of lateral plate and multiple proximal and distal cortical screws traversing the fracture near anatomic alignment on the final images. FLUOROSCOPY TIME:  5 minutes 6 seconds IMPRESSION: ORIF distal right femur with no adverse features. Electronically Signed   By: Genevie Ann M.D.   On: 09/01/2018 14:42   Dg Femur Port Min 2 Views Left  Result Date: 09/01/2018 CLINICAL DATA:  85 year old male status post fall, status post left proximal femur ORIF. EXAM: LEFT FEMUR PORTABLE 2 VIEWS COMPARISON:  Intraoperative images 10 57 hours today. FINDINGS: Portable AP and cross-table lateral views. Left femur intramedullary rod with proximal interlocking dynamic hip screw and distal interlocking cortical screws. These traverse the oblique proximal  left femur fracture which is just distal to the lesser trochanter. Improved fracture alignment, near anatomic on the cross-table lateral view. Postoperative changes to the surrounding soft tissues with soft tissue gas. Stable visible pelvis. IMPRESSION: Left femur ORIF with no adverse features. Electronically Signed   By: Genevie Ann M.D.   On: 09/01/2018 14:44   Dg Femur Port, Min 2 Views Right  Result Date: 09/01/2018 CLINICAL DATA:  85 year old male status post fall, status post distal right femur ORIF. EXAM: RIGHT FEMUR PORTABLE 2 VIEW COMPARISON:  Intraoperative images 12 44 hours today. FINDINGS: Portable AP and cross-table lateral views. Lateral plate with interlocking screws extends from the mid right femoral shaft to the distal femur and traverses the comminuted distal metadiaphysis fracture. Improved fracture alignment. No adverse hardware features. Stable visible pelvis and proximal right femur. Calcified right femoral artery atherosclerosis. IMPRESSION: ORIF right femur with no adverse features. Electronically Signed   By: Genevie Ann M.D.   On: 09/01/2018 14:46     Assessment/Plan: 1. Diagnosis: Left subtrochanteric femur fracture and right supracondylar femur fracture does the need for close, 24 hr/day medical supervision in concert with the patient's rehab needs make it unreasonable for this patient to be served in a less intensive setting? Yes 2. Co-Morbidities requiring supervision/potential complications: Acute blood loss anemia, diabetes, history of rectal carcinoma with colostomy, acute urinary retention with Foley 3. Due to bladder management, bowel management, safety, skin/wound care, disease management, medication administration, pain management and patient education, does the patient require 24 hr/day rehab nursing? Yes 4. Does the patient require coordinated care of a physician, rehab nurse, PT (1-2 hrs/day, 5 days/week) and OT (1-2 hrs/day, 5 days/week) to address physical and  functional deficits in the context of the above medical diagnosis(es)? Yes Addressing deficits in the following areas: balance, endurance, locomotion, strength, transferring, bowel/bladder control, bathing, dressing, feeding, grooming, toileting, cognition and psychosocial support 5. Can the patient actively participate in an intensive therapy program of at least 3 hrs of therapy per day at least 5 days per week? Yes 6. The potential for patient to make measurable gains while on inpatient rehab is good 7. Anticipated functional outcomes upon discharge from inpatient rehab are min assist  with PT, min assist with OT, n/a with SLP. 8. Estimated rehab length of stay to reach the above functional goals is: 20-23d 9. Anticipated D/C setting: If patient has min assist then he will be going home otherwise may need SNF 10. Anticipated post D/C treatments: [place] therapy 11. Overall Rehab/Functional Prognosis: good  RECOMMENDATIONS: This patient's condition is appropriate for continued rehabilitative care  in the following setting: CIR Patient has agreed to participate in recommended program. Yes Note that insurance prior authorization may be required for reimbursement for recommended care.  Comment: Patient states that son is in the hospital with pneumonia "I have personally performed a face to face diagnostic evaluation of this patient.  Additionally, I have reviewed and concur with the physician assistant's documentation above."  Charlett Blake M.D. [facility] FAAPM&R (Sports Med, Neuromuscular Med) Diplomate Am Board of Electrodiagnostic Med  Elizabeth Sauer 09/02/2018

## 2018-09-03 LAB — TYPE AND SCREEN
ABO/RH(D): A POS
Antibody Screen: NEGATIVE
UNIT DIVISION: 0
Unit division: 0
Unit division: 0
Unit division: 0

## 2018-09-03 LAB — CBC
HCT: 23.2 % — ABNORMAL LOW (ref 39.0–52.0)
Hemoglobin: 7.7 g/dL — ABNORMAL LOW (ref 13.0–17.0)
MCH: 29.1 pg (ref 26.0–34.0)
MCHC: 33.2 g/dL (ref 30.0–36.0)
MCV: 87.5 fL (ref 80.0–100.0)
NRBC: 0 % (ref 0.0–0.2)
Platelets: 117 10*3/uL — ABNORMAL LOW (ref 150–400)
RBC: 2.65 MIL/uL — ABNORMAL LOW (ref 4.22–5.81)
RDW: 14.6 % (ref 11.5–15.5)
WBC: 7.9 10*3/uL (ref 4.0–10.5)

## 2018-09-03 LAB — BPAM RBC
Blood Product Expiration Date: 202002152359
Blood Product Expiration Date: 202002292359
Blood Product Expiration Date: 202003062359
Blood Product Expiration Date: 202003062359
ISSUE DATE / TIME: 202002110014
ISSUE DATE / TIME: 202002110214
ISSUE DATE / TIME: 202002130844
ISSUE DATE / TIME: 202002140302
Unit Type and Rh: 6200
Unit Type and Rh: 6200
Unit Type and Rh: 6200
Unit Type and Rh: 6200

## 2018-09-03 LAB — BASIC METABOLIC PANEL
ANION GAP: 7 (ref 5–15)
BUN: 18 mg/dL (ref 8–23)
CALCIUM: 7.6 mg/dL — AB (ref 8.9–10.3)
CO2: 22 mmol/L (ref 22–32)
Chloride: 109 mmol/L (ref 98–111)
Creatinine, Ser: 0.82 mg/dL (ref 0.61–1.24)
GFR calc Af Amer: >60 mL/min (ref >60)
GFR calc non Af Amer: >60 mL/min (ref >60)
Glucose, Bld: 141 mg/dL — ABNORMAL HIGH (ref 70–99)
Potassium: 4 mmol/L (ref 3.5–5.1)
Sodium: 138 mmol/L (ref 135–145)

## 2018-09-03 LAB — GLUCOSE, CAPILLARY
GLUCOSE-CAPILLARY: 165 mg/dL — AB (ref 70–99)
Glucose-Capillary: 230 mg/dL — ABNORMAL HIGH (ref 70–99)
Glucose-Capillary: 180 mg/dL — ABNORMAL HIGH (ref 70–99)
Glucose-Capillary: 158 mg/dL — ABNORMAL HIGH (ref 70–99)

## 2018-09-03 MED ORDER — ACETAMINOPHEN 500 MG PO TABS
500.0000 mg | ORAL_TABLET | Freq: Three times a day (TID) | ORAL | Status: DC
  Administered 2018-09-03 – 2018-09-06 (×10): 500 mg via ORAL
  Filled 2018-09-03 (×10): qty 1

## 2018-09-03 MED ORDER — DEXTROSE-NACL 5-0.9 % IV SOLN
INTRAVENOUS | Status: DC
  Administered 2018-09-03: 23:00:00 via INTRAVENOUS

## 2018-09-03 MED ORDER — HYDROCODONE-ACETAMINOPHEN 5-325 MG PO TABS
1.0000 | ORAL_TABLET | ORAL | Status: DC | PRN
  Administered 2018-09-03 – 2018-09-06 (×10): 1 via ORAL
  Filled 2018-09-03 (×10): qty 1

## 2018-09-03 NOTE — Clinical Social Work Placement (Signed)
   CLINICAL SOCIAL WORK PLACEMENT  NOTE  Date:  09/03/2018  Patient Details  Name: Ralph Yang MRN: [id number] Date of Birth: [birth year]  Clinical Social Work is seeking post-discharge placement for this patient at the [place] level of care (*CSW will initial, date and re-position this form in  chart as items are completed):      Patient/family provided with [facility]'s list of facilities offering this level of care within the geographic area requested by the patient (or if unable, by the patient's family).      Patient/family informed of their freedom to choose among providers that offer the needed level of care, that participate in Medicare, Medicaid or managed care program needed by the patient, have an available bed and are willing to accept the patient.      Patient/family informed of [facility]'s ownership interest in [facility] and [facility], as well as of the fact that they are under no obligation to receive care at these facilities.  PASRR submitted to EDS on 08/31/18     PASRR number received on       Existing PASRR number confirmed on 08/31/18     FL2 transmitted to all facilities in geographic area requested by pt/family on 09/03/18     FL2 transmitted to all facilities within larger geographic area on       Patient informed that his/her managed care company has contracts with or will negotiate with certain facilities, including the following:        Yes   Patient/family informed of bed offers received.  Patient chooses bed at [facility]     Physician recommends and patient chooses bed at      Patient to be transferred to   on  .  Patient to be transferred to facility by       Patient family notified on   of transfer.  Name of family member notified:        PHYSICIAN       Additional Comment:    _______________________________________________ Candie Chroman, LCSW 09/03/2018,  2:35 PM

## 2018-09-03 NOTE — Progress Notes (Signed)
Noted on telemetry periods of apnea. Assessment of patient notes respirations 16 to 18 per minute. Noted desats at times but rebounds quickly.  Denver Faster, BSN

## 2018-09-03 NOTE — Progress Notes (Signed)
Foley catheter removed today at 0600. Pt has not voided, bladder scanner reveals 121ml. PA text paged.

## 2018-09-03 NOTE — Progress Notes (Signed)
Orthopaedic Trauma Progress Note  S: Patient doing well this morning, pain has been well controlled. Does not have any complaints. Continues to work with therapies, they are currently recommending CIR. Consult has been completed and patient may be admitted to inpatient rehab today.  O:  Vitals:   09/03/18 0000 09/03/18 0332  BP: (!) 121/59 140/72  Pulse:  87  Resp:  14  Temp: 98.5 F (36.9 C) 98.3 F (36.8 C)  SpO2: 98% 98%   General - Resting comfortably in bed, no acute distress.   LLE - Dressings removed. Incisions are clean and intact with minimal drainage from the left lateral hip incison. Tenderness to palpation of hip and lateral thigh. Able to get about 20 degrees of knee flexion without significant discomfort. Good ankle ROM. Sensation and motor function intact. Neurovascularly intact  RLE - Dressings removed. Incisions are clean, dry, intact. Tenderness to palpation of the knee. Able to get about 20 degrees of knee flexion without significant discomfort. Good ankle ROM. Sensation and motor function intact. +DP pulse.   Imaging: Stable post op imaging  Labs:  Results for orders placed or performed during the hospital encounter of 08/30/18 (from the past 24 hour(s))  Prepare RBC     Status: None   Collection Time: 09/02/18  8:15 AM  Result Value Ref Range   Order Confirmation      ORDER PROCESSED BY BLOOD BANK BLOOD ALREADY AVAILABLE Performed at [facility] [address] [place], [place] [address]   Glucose, capillary     Status: Abnormal   Collection Time: 09/02/18  4:09 PM  Result Value Ref Range   Glucose-Capillary 198 (H) 70 - 99 mg/dL  Glucose, capillary     Status: Abnormal   Collection Time: 09/02/18 10:03 PM  Result Value Ref Range   Glucose-Capillary 212 (H) 70 - 99 mg/dL  CBC     Status: Abnormal   Collection Time: 09/03/18  3:50 AM  Result Value Ref Range   WBC 7.9 4.0 - 10.5 K/uL   RBC 2.65 (L) 4.22 - 5.81 MIL/uL   Hemoglobin 7.7 (L) 13.0 -  17.0 g/dL   HCT 23.2 (L) 39.0 - 52.0 %   MCV 87.5 80.0 - 100.0 fL   MCH 29.1 26.0 - 34.0 pg   MCHC 33.2 30.0 - 36.0 g/dL   RDW 14.6 11.5 - 15.5 %   Platelets 117 (L) 150 - 400 K/uL   nRBC 0.0 0.0 - 0.2 %  Basic metabolic panel     Status: Abnormal   Collection Time: 09/03/18  3:50 AM  Result Value Ref Range   Sodium 138 135 - 145 mmol/L   Potassium 4.0 3.5 - 5.1 mmol/L   Chloride 109 98 - 111 mmol/L   CO2 22 22 - 32 mmol/L   Glucose, Bld 141 (H) 70 - 99 mg/dL   BUN 18 8 - 23 mg/dL   Creatinine, Ser 0.82 0.61 - 1.24 mg/dL   Calcium 7.6 (L) 8.9 - 10.3 mg/dL   GFR calc non Af Amer >60 >60 mL/min   GFR calc Af Amer >60 >60 mL/min   Anion gap 7 5 - 15    Assessment: 85 year old s/p mechanical fall  Injuries: 1. Left subtrochanteric femur fracture s/p cepholmeduallry nail on 09/01/18 2. Right supracondylar distal femur fracture s/p ORIF on 09/01/18  Weightbearing: WBAT BLE  Insicional and dressing care: Change dressings as needed  Orthopedic device(s): None right now  CV/Blood loss: acute  blood loss anemia, Hgb 7.7 this AM. Up from 7.1 yesterday. Hemodynamically stable. Received 1 unit PRBCs on the unit yesterday.  Pain management: 1. Tylenol 500 mg q 8 hours scheduled 2. Dilaudid 0.5 mg q 3 hours PRN 3. Norco 5/325 q 4 hours PRN  VTE prophylaxis: okay to start Lovenox 40 mg from orthopedic perspective, will leave this up to trauma team  ID: Ancef 2 g postoperatively   Foley/Lines: Foley catheter in place, KVO IVFs  Medical co-morbidities: HTN, DM, HLD, hx of rectal CA, chronic back pain  Impediments to Fracture Healing: Diabetes  Dispo: PT/OT eval, will likely go to CIR today  Follow - up plan: Will continue to follow while in hospital, will plan to see patient as outpatient 2 weeks after discharge    Clorine Swing A. [facility] ?[phone]? (phone)

## 2018-09-03 NOTE — Progress Notes (Signed)
Inpatient Rehabilitation Admissions Coordinator  I met with pt at bedside and then contacted his niece, Seabron Spates, per his request to discuss preferences for rehab venue. Patient is from [place] and prefers rehab at [facility] for it is 2 blocks from his home and he has been there previously. This is preferred venue also per his wife due to driving issues. I have updated SW, Michiel Cowboy and will therefore sign off at this time.  Danne Baxter, RN, MSN Rehab Admissions Coordinator [phone] 09/03/2018 11:34 AM
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
## 2018-09-03 NOTE — Care Management Important Message (Signed)
Important Message  Patient Details  Name: Ralph Yang MRN: [id number] Date of Birth: 1933/12/18   Medicare Important Message Given:  Yes    Lance Galas [address] 09/03/2018, 4:09 PM

## 2018-09-03 NOTE — Progress Notes (Signed)
Patient's urinary catheter removed at 0600 today. Order for I & O cath received as patient had not voided for 8 hours. Pt was finally able to void 450 ml at 1600. Post void bladder scan revealed 0 ml of urine present.

## 2018-09-03 NOTE — Clinical Social Work Note (Signed)
Clinical Social Work Assessment  Patient Details  Name: Ralph Yang MRN: [id number] Date of Birth: Jan 11, 1934  Date of referral:  09/03/18               Reason for consult:  Facility Placement, Discharge Planning                Permission sought to share information with:  Chartered certified accountant granted to share information::  Yes, Verbal Permission Granted  Name::        Agency::  [facility]  Relationship::     Contact Information:     Housing/Transportation Living arrangements for the past 2 months:  [place] of Information:  Patient, Medical Team Patient Interpreter Needed:  None Criminal Activity/Legal Involvement Pertinent to Current Situation/Hospitalization:  No - Comment as needed Significant Relationships:  Adult Children, Spouse, Other Family Members(Niece) Lives with:  Adult Children, Spouse Do you feel safe going back to the place where you live?  Yes Need for family participation in patient care:  Yes (Comment)  Care giving concerns:  Patient and family prefer SNF to CIR once medically stable.   Social Worker assessment / plan:  CSW met with patient. No supports at bedside. CSW introduced role. Patient confirmed preference for [facility] SNF for rehab when stable for discharge. Patient stated "I don't think I'll be able to go home." Notified patient that they have a bed and are starting insurance authorization. Likely will not have authorization until at least Monday. Provided patient with CMS Medicare scores for [facility]. No further concerns. CSW encouraged patient to contact CSW as needed. CSW will continue to follow patient for support and facilitate discharge to [facility] SNF once medically stable and authorization obtained.  Employment status:  Retired Nurse, adult PT Recommendations:  Inpatient [place] / Referral to community resources:   [place]  Patient/Family's Response to care:  Patient agreeable to SNF placement. Patient's family supportive and involved in patient's care. Patient appreciated social work intervention.  Patient/Family's Understanding of and Emotional Response to Diagnosis, Current Treatment, and Prognosis:  Patient has a good understanding of the reason for admission and his need for rehab prior to returning home. Patient appears pleased with hospital care.  Emotional Assessment Appearance:  Appears stated age Attitude/Demeanor/Rapport:  Engaged, Gracious Affect (typically observed):  Accepting, Calm Orientation:  Oriented to Self, Oriented to Place, Oriented to  Time, Oriented to Situation Alcohol / Substance use:  Never Used Psych involvement (Current and /or in the community):  No (Comment)  Discharge Needs  Concerns to be addressed:  Care Coordination Readmission within the last 30 days:  No Current discharge risk:  Dependent with Mobility Barriers to Discharge:  [employer] broker, Continued Medical Work up   Candie Chroman, LCSW 09/03/2018, 2:31 PM
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
## 2018-09-03 NOTE — Social Work (Addendum)
11:51am- Kerri with [facility] admissions returned call, offered bed to pt. She will initiate insurance auth with [employer].  11:35am- Spoke with Danne Baxter with CIR, pt and family interested in SNF placement at [facility]. Left voicemail with referral on admissions voicemail. Await return call.  Westley Hummer, MSW, [place] Work [phone]

## 2018-09-04 LAB — GLUCOSE, CAPILLARY
GLUCOSE-CAPILLARY: 226 mg/dL — AB (ref 70–99)
GLUCOSE-CAPILLARY: 160 mg/dL — AB (ref 70–99)
Glucose-Capillary: 149 mg/dL — ABNORMAL HIGH (ref 70–99)

## 2018-09-04 LAB — CBC
HCT: 22.3 % — ABNORMAL LOW (ref 39.0–52.0)
Hemoglobin: 7.3 g/dL — ABNORMAL LOW (ref 13.0–17.0)
MCH: 29 pg (ref 26.0–34.0)
MCHC: 32.7 g/dL (ref 30.0–36.0)
MCV: 88.5 fL (ref 80.0–100.0)
Platelets: 142 10*3/uL — ABNORMAL LOW (ref 150–400)
RBC: 2.52 MIL/uL — ABNORMAL LOW (ref 4.22–5.81)
RDW: 14.3 % (ref 11.5–15.5)
WBC: 6.9 10*3/uL (ref 4.0–10.5)
nRBC: 0 % (ref 0.0–0.2)
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
## 2018-09-04 NOTE — Progress Notes (Signed)
Physical Therapy Treatment Patient Details Name: Ralph Yang MRN: [id number] DOB: 03-27-1934 Today's Date: 09/04/2018    History of Present Illness Pt is a 85 y.o.white male with history notable for rectal cancer status post colostomy, and diabetes and hypertension, chronic back pain on chronic tramadol treatment who presented to [facility] after sustaining a ground-level fall while at home.  Patient was found to have left hip fracture and right distal femur fracture, hypotensive. S/p ORIF R distal femur 2/12, IM nail L proximal femur 2/12. WBAT B LEs.     PT Comments    Pt making slow progress with functional mobility and continues to require two person physical assistance for mobility with use of the STEDY for transfers. He remains limited secondary to pain and fatigue. Pt would continue to benefit from skilled physical therapy services at this time while admitted and after d/c to address the below listed limitations in order to improve overall safety and independence with functional mobility.   Follow Up Recommendations  SNF;Other (comment)(pt requesting SNF vs CIR)     Equipment Recommendations  None recommended by PT    Recommendations for Other Services       Precautions / Restrictions Precautions Precautions: Fall Restrictions Weight Bearing Restrictions: Yes RLE Weight Bearing: Weight bearing as tolerated LLE Weight Bearing: Weight bearing as tolerated    Mobility  Bed Mobility Overal bed mobility: Needs Assistance Bed Mobility: Supine to Sit     Supine to sit: Mod assist;+2 for physical assistance;HOB elevated     General bed mobility comments: increased time and effort, cueing for sequencing, use of bed rails, use of bed pads to position pt's hips at EOB, assistance needed for bilateral LE movement off of bed and for trunk elevation  Transfers Overall transfer level: Needs assistance Equipment used: (STEDY) Transfers: Sit to/from Stand Sit to  Stand: From elevated surface;Mod assist;+2 physical assistance         General transfer comment: increased time, cueing for safe hand placement, cueing for upright trunk and hip extension  Ambulation/Gait                 Stairs             Wheelchair Mobility    Modified Rankin (Stroke Patients Only)       Balance Overall balance assessment: Needs assistance Sitting-balance support: Feet supported Sitting balance-Leahy Scale: Fair     Standing balance support: Bilateral upper extremity supported;During functional activity Standing balance-Leahy Scale: Poor Standing balance comment: reliant on external assistance                            Cognition Arousal/Alertness: Awake/alert Behavior During Therapy: Flat affect Overall Cognitive Status: Impaired/Different from baseline Area of Impairment: Memory;Following commands;Safety/judgement;Problem solving                     Memory: Decreased short-term memory;Decreased recall of precautions Following Commands: Follows one step commands with increased time Safety/Judgement: Decreased awareness of deficits;Decreased awareness of safety   Problem Solving: Slow processing;Decreased initiation;Difficulty sequencing;Requires verbal cues;Requires tactile cues        Exercises General Exercises - Lower Extremity Ankle Circles/Pumps: AROM;Both;20 reps;Supine Long Arc Quad: AROM;Strengthening;Both;10 reps;Seated Hip ABduction/ADduction: AAROM;Both;10 reps;Supine    General Comments        Pertinent Vitals/Pain Pain Assessment: Faces Faces Pain Scale: Hurts whole lot Pain Location: L LE with mobility Pain Descriptors / Indicators: Discomfort;Grimacing;Operative site  guarding Pain Intervention(s): Monitored during session;Repositioned    Home Living                      Prior Function            PT Goals (current goals can now be found in the care plan section) Acute Rehab  PT Goals PT Goal Formulation: With patient Time For Goal Achievement: 09/16/18 Potential to Achieve Goals: Fair Progress towards PT goals: Progressing toward goals    Frequency    Min 2X/week      PT Plan Discharge plan needs to be updated    Co-evaluation              AM-PAC PT "6 Clicks" Mobility   Outcome Measure  Help needed turning from your back to your side while in a flat bed without using bedrails?: A Lot Help needed moving from lying on your back to sitting on the side of a flat bed without using bedrails?: A Lot Help needed moving to and from a bed to a chair (including a wheelchair)?: A Lot Help needed standing up from a chair using your arms (e.g., wheelchair or bedside chair)?: A Lot Help needed to walk in hospital room?: Total Help needed climbing 3-5 steps with a railing? : Total 6 Click Score: 10    End of Session Equipment Utilized During Treatment: Gait belt Activity Tolerance: Patient limited by pain;Patient limited by fatigue Patient left: in chair;with call bell/phone within reach;with chair alarm set Nurse Communication: Mobility status;Need for lift equipment PT Visit Diagnosis: Other abnormalities of gait and mobility (R26.89);Pain Pain - Right/Left: (bilateral) Pain - part of body: Leg     Time: 1035-1059 PT Time Calculation (min) (ACUTE ONLY): 24 min  Charges:  $Therapeutic Activity: 23-37 mins                     Sherie Don, PT, DPT  Acute Rehabilitation Services Pager [phone] Office [place] 09/04/2018, 3:52 PM

## 2018-09-05 LAB — GLUCOSE, CAPILLARY
GLUCOSE-CAPILLARY: 145 mg/dL — AB (ref 70–99)
Glucose-Capillary: 130 mg/dL — ABNORMAL HIGH (ref 70–99)
Glucose-Capillary: 181 mg/dL — ABNORMAL HIGH (ref 70–99)
Glucose-Capillary: 153 mg/dL — ABNORMAL HIGH (ref 70–99)
Glucose-Capillary: 142 mg/dL — ABNORMAL HIGH (ref 70–99)

## 2018-09-05 MED ORDER — POLYETHYLENE GLYCOL 3350 17 G PO PACK
17.0000 g | PACK | Freq: Every day | ORAL | Status: DC
  Administered 2018-09-05 – 2018-09-06 (×2): 17 g via ORAL
  Filled 2018-09-05 (×2): qty 1

## 2018-09-05 MED ORDER — DOCUSATE SODIUM 100 MG PO CAPS
100.0000 mg | ORAL_CAPSULE | Freq: Two times a day (BID) | ORAL | Status: DC
  Administered 2018-09-05 – 2018-09-06 (×3): 100 mg via ORAL
  Filled 2018-09-05 (×3): qty 1

## 2018-09-05 NOTE — Progress Notes (Signed)
Patient ID: Ralph Yang, male   DOB: Mar 07, 1934, 85 y.o.   MRN: [id number]    4 Days Post-Op  Subjective: No new complaints.  Working with therapies.  Eating well.  Just flatus in ostomy.  No real feculent output since admission.  Objective: Vital signs in last 24 hours: Temp:  [98.1 F (36.7 C)-98.9 F (37.2 C)] 98.1 F (36.7 C) (02/16 0839) Pulse Rate:  [83-102] 87 (02/16 0839) Resp:  [13-21] 18 (02/16 0839) BP: (106-134)/(44-74) 134/57 (02/16 0839) SpO2:  [97 %-100 %] 98 % (02/16 0839) Last BM Date: 09/05/18  Intake/Output from previous day: 02/15 0701 - 02/16 0700 In: 196.3 [P.O.:118; I.V.:78.3] Out: 1050 [Urine:1050] Intake/Output this shift: Total I/O In: 360 [P.O.:360] Out: -   PE: Gen: NAD Heart: regular Lungs: CTAB Abd: soft, NT, ND, ostomy with flatus, but no stool Ext: both lower extremities with edema.  Incisions are c/d/i on right thigh.  +2 pedal pulses in both feet  Lab Results:  Recent Labs    09/03/18 0350 09/04/18 0639  WBC 7.9 6.9  HGB 7.7* 7.3*  HCT 23.2* 22.3*  PLT 117* 142*   BMET Recent Labs    09/03/18 0350  NA 138  K 4.0  CL 109  CO2 22  GLUCOSE 141*  BUN 18  CREATININE 0.82  CALCIUM 7.6*   PT/INR No results for input(s): LABPROT, INR in the last 72 hours. CMP     Component Value Date/Time   NA 138 09/03/2018 0350   K 4.0 09/03/2018 0350   CL 109 09/03/2018 0350   CO2 22 09/03/2018 0350   GLUCOSE 141 (H) 09/03/2018 0350   BUN 18 09/03/2018 0350   CREATININE 0.82 09/03/2018 0350   CALCIUM 7.6 (L) 09/03/2018 0350   CALCIUM 8.1 (L) 08/31/2018 0637   PROT 5.0 (L) 08/31/2018 0637   ALBUMIN 2.8 (L) 08/31/2018 0637   AST 43 (H) 08/31/2018 0637   ALT 18 08/31/2018 0637   ALKPHOS 50 08/31/2018 0637   BILITOT 1.9 (H) 08/31/2018 0637   GFRNONAA >60 09/03/2018 0350   GFRAA >60 09/03/2018 0350   Lipase  No results found for: LIPASE     Studies/Results: No results found.  Anti-infectives: Anti-infectives (From  admission, onward)   Start     Dose/Rate Route Frequency Ordered Stop   09/01/18 1800  ceFAZolin (ANCEF) IVPB 2g/100 mL premix     2 g 200 mL/hr over 30 Minutes Intravenous Every 8 hours 09/01/18 1505 09/02/18 1714   09/01/18 1118  vancomycin (VANCOCIN) powder  Status:  Discontinued       As needed 09/01/18 1118 09/01/18 1328   09/01/18 1116  vancomycin (VANCOCIN) powder  Status:  Discontinued       As needed 09/01/18 1117 09/01/18 1328   08/31/18 1330  ceFAZolin (ANCEF) IVPB 2g/100 mL premix     2 g 200 mL/hr over 30 Minutes Intravenous To [facility] 08/30/18 2308 09/01/18 1055       Assessment/Plan Fall R distal femur FX -S/P ORIF 2/12 by Dr. Doreatha Martin, WBAT L proximal femur FX-S/P IM nail 2/12 by Dr. Doreatha Martin, WBAT ABL anemia-TF 1u PRBCon 2/13.  Will continue to monitor.  Stable this AM.recheck in am AKI-resolved DM- SSI HX colon cancer with colostomy and chronic peristomal hernia FEN-diet, KVO IVF, lasix after TF, start colace and miralax VTE- no Lovenoxuntil Hb stable Dispo-  Continue to hold Lovenox for now d/t anemia, repeat cbc today to determine stability in starting chemical prophylaxis. CIR following.  LOS: 6 days    Henreitta Cea , [facility] 09/05/2018, 11:09 AM Pager: [phone]

## 2018-09-05 NOTE — Progress Notes (Addendum)
Occupational Therapy Treatment Patient Details Name: Ralph Yang MRN: [id number] DOB: 06/28/1934 Today's Date: 09/05/2018    History of present illness Pt is a 85 y.o.white male with history notable for rectal cancer status post colostomy, and diabetes and hypertension, chronic back pain on chronic tramadol treatment who presented to [facility] after sustaining a ground-level fall while at home.  Patient was found to have left hip fracture and right distal femur fracture, hypotensive. S/p ORIF R distal femur 2/12, IM nail L proximal femur 2/12. WBAT B LEs.    OT comments  Pt. Seen for introduction of B UE HEP program.  Pt. Able to return demo bed level of B UE exercises and also demonstrated some he had been completing on his own.  Did note grimacing and c/o pain in L shoulder during exercises. Instructed to maintain in a level that was not painful.     Follow Up Recommendations  CIR;Supervision/Assistance - 24 hour    Equipment Recommendations  3 in 1 bedside commode    Recommendations for Other Services      Precautions / Restrictions Precautions Precautions: Fall Restrictions Weight Bearing Restrictions: Yes RLE Weight Bearing: Weight bearing as tolerated LLE Weight Bearing: Weight bearing as tolerated       Mobility Bed Mobility                  Transfers                      Balance                                           ADL either performed or assessed with clinical judgement   ADL                                               Vision       Perception     Praxis      Cognition Arousal/Alertness: Awake/alert Behavior During Therapy: WFL for tasks assessed/performed Overall Cognitive Status: Within Functional Limits for tasks assessed                                          Exercises General Exercises - Upper Extremity Shoulder Flexion: AROM;Both;10 reps;Supine(  seated position in bed) Shoulder Extension: AROM;Both;10 reps;Supine;Other (comment)(seated position in bed) Elbow Flexion: AROM;Both;10 reps;Supine(in seated position in bed) Elbow Extension: AROM;Both;10 reps;Supine;Other (comment)(in seated position in bed) Other Exercises Other Exercises: pt. demonstrated several BUE exercises he has been completing on his own.    Shoulder Instructions       General Comments      Pertinent Vitals/ Pain       Pain Assessment: Faces Faces Pain Scale: Hurts little more Pain Location: LUE during UE exercises Pain Descriptors / Indicators: Discomfort  Home Living                                          Prior Functioning/Environment  Frequency  Min 2X/week        Progress Toward Goals  OT Goals(current goals can now be found in the care plan section)  Progress towards OT goals: Progressing toward goals     Plan Discharge plan remains appropriate    Co-evaluation                 AM-PAC OT "6 Clicks" Daily Activity     Outcome Measure   Help from another person eating meals?: None Help from another person taking care of personal grooming?: A Little Help from another person toileting, which includes using toliet, bedpan, or urinal?: Total Help from another person bathing (including washing, rinsing, drying)?: A Lot Help from another person to put on and taking off regular upper body clothing?: A Little Help from another person to put on and taking off regular lower body clothing?: Total 6 Click Score: 14    End of Session    OT Visit Diagnosis: Other abnormalities of gait and mobility (R26.89);Pain;Muscle weakness (generalized) (M62.81) Pain - part of body: Leg;Hip   Activity Tolerance Patient tolerated treatment well   Patient Left in bed;with call bell/phone within reach   Nurse Communication Other (comment)(verified that bed alarm had been reset)        Time: 1610-9604 OT  Time Calculation (min): 16 min  Charges: OT General Charges $OT Visit: 1 Visit OT Treatments $Therapeutic Exercise: 8-22 mins  Janice Coffin, COTA/L 09/05/2018, 10:54 AM

## 2018-09-06 ENCOUNTER — Inpatient Hospital Stay
Admission: RE | Admit: 2018-09-06 | Discharge: 2018-10-08 | Disposition: A | Discharge status: Home / Self Care | Payer: Medicare HMO | Source: Ambulatory Visit | Attending: Internal Medicine | Admitting: Internal Medicine

## 2018-09-06 DIAGNOSIS — S72142S Displaced intertrochanteric fracture of left femur, sequela: Secondary | ICD-10-CM | POA: Diagnosis not present

## 2018-09-06 DIAGNOSIS — R69 Illness, unspecified: Secondary | ICD-10-CM | POA: Diagnosis not present

## 2018-09-06 DIAGNOSIS — K5909 Other constipation: Secondary | ICD-10-CM | POA: Diagnosis not present

## 2018-09-06 DIAGNOSIS — Z4789 Encounter for other orthopedic aftercare: Secondary | ICD-10-CM | POA: Diagnosis not present

## 2018-09-06 DIAGNOSIS — E1159 Type 2 diabetes mellitus with other circulatory complications: Secondary | ICD-10-CM | POA: Diagnosis not present

## 2018-09-06 DIAGNOSIS — S728X1A Other fracture of right femur, initial encounter for closed fracture: Secondary | ICD-10-CM | POA: Diagnosis not present

## 2018-09-06 DIAGNOSIS — E43 Unspecified severe protein-calorie malnutrition: Secondary | ICD-10-CM | POA: Diagnosis not present

## 2018-09-06 DIAGNOSIS — S72401D Unspecified fracture of lower end of right femur, subsequent encounter for closed fracture with routine healing: Secondary | ICD-10-CM | POA: Diagnosis not present

## 2018-09-06 DIAGNOSIS — M6281 Muscle weakness (generalized): Secondary | ICD-10-CM | POA: Diagnosis not present

## 2018-09-06 DIAGNOSIS — N400 Enlarged prostate without lower urinary tract symptoms: Secondary | ICD-10-CM | POA: Diagnosis not present

## 2018-09-06 DIAGNOSIS — D62 Acute posthemorrhagic anemia: Secondary | ICD-10-CM | POA: Diagnosis not present

## 2018-09-06 DIAGNOSIS — S72142D Displaced intertrochanteric fracture of left femur, subsequent encounter for closed fracture with routine healing: Secondary | ICD-10-CM | POA: Diagnosis not present

## 2018-09-06 DIAGNOSIS — E785 Hyperlipidemia, unspecified: Secondary | ICD-10-CM | POA: Diagnosis not present

## 2018-09-06 DIAGNOSIS — I10 Essential (primary) hypertension: Secondary | ICD-10-CM | POA: Diagnosis not present

## 2018-09-06 DIAGNOSIS — R627 Adult failure to thrive: Secondary | ICD-10-CM | POA: Diagnosis not present

## 2018-09-06 DIAGNOSIS — S728X2A Other fracture of left femur, initial encounter for closed fracture: Secondary | ICD-10-CM | POA: Diagnosis not present

## 2018-09-06 DIAGNOSIS — E119 Type 2 diabetes mellitus without complications: Secondary | ICD-10-CM | POA: Diagnosis not present

## 2018-09-06 DIAGNOSIS — S72401S Unspecified fracture of lower end of right femur, sequela: Secondary | ICD-10-CM | POA: Diagnosis not present

## 2018-09-06 DIAGNOSIS — Z85048 Personal history of other malignant neoplasm of rectum, rectosigmoid junction, and anus: Secondary | ICD-10-CM | POA: Diagnosis not present

## 2018-09-06 DIAGNOSIS — M1990 Unspecified osteoarthritis, unspecified site: Secondary | ICD-10-CM | POA: Diagnosis not present

## 2018-09-06 LAB — CBC
HCT: 22.6 % — ABNORMAL LOW (ref 39.0–52.0)
HEMOGLOBIN: 7.5 g/dL — AB (ref 13.0–17.0)
MCH: 30.2 pg (ref 26.0–34.0)
MCHC: 33.2 g/dL (ref 30.0–36.0)
MCV: 91.1 fL (ref 80.0–100.0)
Platelets: 218 10*3/uL (ref 150–400)
RBC: 2.48 MIL/uL — ABNORMAL LOW (ref 4.22–5.81)
RDW: 14.9 % (ref 11.5–15.5)
WBC: 7.8 10*3/uL (ref 4.0–10.5)
nRBC: 0 % (ref 0.0–0.2)

## 2018-09-06 LAB — GLUCOSE, CAPILLARY
Glucose-Capillary: 147 mg/dL — ABNORMAL HIGH (ref 70–99)
Glucose-Capillary: 181 mg/dL — ABNORMAL HIGH (ref 70–99)
Glucose-Capillary: 208 mg/dL — ABNORMAL HIGH (ref 70–99)

## 2018-09-06 MED ORDER — DOCUSATE SODIUM 100 MG PO CAPS: 100.0000 mg | ORAL_CAPSULE | Freq: Two times a day (BID) | ORAL | 0 refills | Status: AC

## 2018-09-06 MED ORDER — ENOXAPARIN SODIUM 40 MG/0.4ML ~~LOC~~ SOLN
40.0000 mg | SUBCUTANEOUS | Status: DC
  Administered 2018-09-06: 40 mg via SUBCUTANEOUS
  Filled 2018-09-06: qty 0.4

## 2018-09-06 MED ORDER — POLYETHYLENE GLYCOL 3350 17 G PO PACK: 17.0000 g | PACK | Freq: Every day | ORAL | 0 refills | Status: AC

## 2018-09-06 MED ORDER — HYDROCODONE-ACETAMINOPHEN 5-325 MG PO TABS: 1.0000 | ORAL_TABLET | Freq: Four times a day (QID) | ORAL | 0 refills | Status: DC | PRN

## 2018-09-06 MED ORDER — ENOXAPARIN SODIUM 40 MG/0.4ML ~~LOC~~ SOLN: 40.0000 mg | SUBCUTANEOUS | Status: DC

## 2018-09-06 MED ORDER — ACETAMINOPHEN 500 MG PO TABS: 500.0000 mg | ORAL_TABLET | Freq: Three times a day (TID) | ORAL | 0 refills | Status: DC

## 2018-09-06 MED ORDER — TAB-A-VITE/IRON PO TABS: 1.0000 | ORAL_TABLET | Freq: Every day | ORAL | 0 refills | Status: DC

## 2018-09-06 MED ORDER — TAB-A-VITE/IRON PO TABS
1.0000 | ORAL_TABLET | Freq: Every day | ORAL | Status: DC
  Administered 2018-09-06: 1 via ORAL
  Filled 2018-09-06: qty 1

## 2018-09-06 NOTE — Progress Notes (Signed)
Orthopaedic Trauma Progress Note  S: Patient doing well this morning, pain has been well controlled. Does not have any complaints. Continues to work with therapies. Will be discharged to [facility] once medically stable.   O:  Vitals:   09/05/18 2000 09/06/18 0000  BP: (!) 113/53   Pulse: 98 86  Resp:    Temp: 98.4 F (36.9 C) 98 F (36.7 C)  SpO2: 100%    General - Laying comfortably in bed, no acute distress.   LLE - Incisions healing well. Tenderness to palpation of hip and lateral thigh. Improving knee ROM. Good ankle ROM. Sensation and motor function intact. Neurovascularly intact  RLE -  Incisions are clean, dry, intact. Minimal tenderness to palpation of the knee. Improving knee ROM. Good ankle ROM. Sensation and motor function intact. +DP pulse.   Imaging: Stable post op imaging  Labs:  Results for orders placed or performed during the hospital encounter of 08/30/18 (from the past 24 hour(s))  Glucose, capillary     Status: Abnormal   Collection Time: 09/05/18  8:13 AM  Result Value Ref Range   Glucose-Capillary 130 (H) 70 - 99 mg/dL  Glucose, capillary     Status: Abnormal   Collection Time: 09/05/18 11:28 AM  Result Value Ref Range   Glucose-Capillary 181 (H) 70 - 99 mg/dL  Glucose, capillary     Status: Abnormal   Collection Time: 09/05/18  4:27 PM  Result Value Ref Range   Glucose-Capillary 145 (H) 70 - 99 mg/dL  Glucose, capillary     Status: Abnormal   Collection Time: 09/05/18  4:41 PM  Result Value Ref Range   Glucose-Capillary 153 (H) 70 - 99 mg/dL  Glucose, capillary     Status: Abnormal   Collection Time: 09/05/18 10:13 PM  Result Value Ref Range   Glucose-Capillary 142 (H) 70 - 99 mg/dL    Assessment: 85 year old s/p mechanical fall  Injuries: 1. Left subtrochanteric femur fracture s/p cepholmeduallry nail on 09/01/18 2. Right supracondylar distal femur fracture s/p ORIF on 09/01/18  Weightbearing: WBAT BLE  Insicional and dressing  care: Change dressings as needed  Orthopedic device(s): None right now  CV/Blood loss: Hgb remains low but patient is hemodynamically stable.    VTE prophylaxis: okay to start Lovenox 40 mg from orthopedic perspective, will leave this up to trauma team   Foley/Lines: Foley catheter in place, KVO IVFs  Medical co-morbidities: HTN, DM, HLD, hx of rectal CA, chronic back pain  Impediments to Fracture Healing: Diabetes  Dispo: PT/OT, okay to be discharged to SNF  Follow - up plan: Will continue to follow while in hospital. Will plan to see patient as outpatient around 09/13/18 for suture removal and repeat x-rays    Eura Radabaugh [facility] ?[phone]? (phone)

## 2018-09-06 NOTE — Progress Notes (Signed)
Occupational Therapy Treatment Patient Details Name: Ralph Yang MRN: [id number] DOB: 05/18/1934 Today's Date: 09/06/2018    History of present illness Pt is a 85 y.o.white male with history notable for rectal cancer status post colostomy, and diabetes and hypertension, chronic back pain on chronic tramadol treatment who presented to [facility] after sustaining a ground-level fall while at home.  Patient was found to have left hip fracture and right distal femur fracture, hypotensive. S/p ORIF R distal femur 2/12, IM nail L proximal femur 2/12. WBAT B LEs.    OT comments  Patient supine in bed, pleasant and cooperative.  Bed mobility with mod assist, seated EOB to complete grooming with setup assist and UB exercises seated EOB with supervision.  Increased effort and rest breaks required at EOB, fatigues easily.  Reports back pain, RN notified.  DC plan updated to SNF, as patient wants to be at facility closer to home.  Will follow.     Follow Up Recommendations  SNF;Supervision/Assistance - 24 hour    Equipment Recommendations  3 in 1 bedside commode    Recommendations for Other Services      Precautions / Restrictions Precautions Precautions: Fall Restrictions Weight Bearing Restrictions: Yes RLE Weight Bearing: Weight bearing as tolerated LLE Weight Bearing: Weight bearing as tolerated       Mobility Bed Mobility Overal bed mobility: Needs Assistance Bed Mobility: Supine to Sit;Sit to Supine     Supine to sit: Mod assist Sit to supine: Mod assist   General bed mobility comments: increased time and effort, able to initate movememt of B LEs towards EOB but assist to complete motion, min assist to ascend trunk; return to supine with assist for B LEs only   Transfers                 General transfer comment: deferred (1+ assist only)    Balance Overall balance assessment: Needs assistance Sitting-balance support: Feet supported Sitting balance-Leahy  Scale: Fair Sitting balance - Comments: EOB dynamically with supervision                                    ADL either performed or assessed with clinical judgement   ADL Overall ADL's : Needs assistance/impaired     Grooming: Wash/dry face;Set up;Sitting                                 General ADL Comments: continues t obe limited by pain and weakness      Vision       Perception     Praxis      Cognition Arousal/Alertness: Awake/alert Behavior During Therapy: WFL for tasks assessed/performed Overall Cognitive Status: Within Functional Limits for tasks assessed                                          Exercises Exercises: General Upper Extremity;Other exercises General Exercises - Upper Extremity Shoulder Flexion: AROM;Both;10 reps;Seated Shoulder Extension: AROM;Both;10 reps;Other (comment);Seated Elbow Flexion: AROM;Both;10 reps;Seated Elbow Extension: AROM;Both;10 reps;Other (comment);Seated Other Exercises Other Exercises: forward press and extension with shoulder retraction x 10 reps seated   Shoulder Instructions       General Comments VSS    Pertinent Vitals/ Pain  Pain Assessment: 0-10 Pain Score: 8  Pain Location: lower back Pain Descriptors / Indicators: Discomfort Pain Intervention(s): Monitored during session;Repositioned;Patient requesting pain meds-RN notified  Home Living                                          Prior Functioning/Environment              Frequency  Min 2X/week        Progress Toward Goals  OT Goals(current goals can now be found in the care plan section)  Progress towards OT goals: Progressing toward goals  Acute Rehab OT Goals Patient Stated Goal: to get better OT Goal Formulation: With patient Time For Goal Achievement: 09/16/18 Potential to Achieve Goals: Good  Plan Discharge plan needs to be updated;Frequency remains appropriate     Co-evaluation                 AM-PAC OT "6 Clicks" Daily Activity     Outcome Measure   Help from another person eating meals?: None Help from another person taking care of personal grooming?: A Little Help from another person toileting, which includes using toliet, bedpan, or urinal?: Total Help from another person bathing (including washing, rinsing, drying)?: A Lot Help from another person to put on and taking off regular upper body clothing?: A Little Help from another person to put on and taking off regular lower body clothing?: Total 6 Click Score: 14    End of Session    OT Visit Diagnosis: Other abnormalities of gait and mobility (R26.89);Pain;Muscle weakness (generalized) (M62.81) Pain - part of body: (lower back)   Activity Tolerance Patient tolerated treatment well   Patient Left in bed;with call bell/phone within reach;with bed alarm set;with SCD's reapplied   Nurse Communication Mobility status;Patient requests pain meds        Time: 3383-2919 OT Time Calculation (min): 26 min  Charges: OT General Charges $OT Visit: 1 Visit OT Treatments $Self Care/Home Management : 8-22 mins $Therapeutic Exercise: 8-22 mins  Delight Stare, [place] Pager [phone] Office [phone]    Delight Stare 09/06/2018, 9:59 AM

## 2018-09-06 NOTE — Discharge Instructions (Signed)
Weightbearing as tolerated to bilateral lower extremities Insicional and dressing care from orthopedic surgery: Change dressings as needed Follow up with Dr. Doreatha Martin in about 1 week for suture removal   Pain Medicine Instructions You may need pain medicine after an injury or illness. Two common types of pain medicine are:  Opioid pain medicine. These may be called opioids.  Non-opioid pain medicine. This includes NSAIDs. It is important to follow your doctor's instructions when you are taking pain medicine. Doing this can keep yourself and others safe. How can pain medicine affect me? Pain medicine may not make all of your pain go away. It should make you comfortable enough to:  Move.  Breathe.  Do normal activities. Opioids can cause side effects, such as:  Trouble pooping (constipation).  Feeling sick to your stomach (nausea).  Throwing up (vomiting).  Feeling very sleepy.  Confusion.  Taking the medicine for nonmedical reasons even though taking it hurts your health and well-being (opioid use disorder).  Trouble breathing (respiratory depression). Taking opioids for longer than 3 days raises your risk of these side effects. Taking opioids for a long time can affect how well you can do daily tasks. Taking them for a long time also puts you at risk for:  Car crashes.  Depression.  Suicide.  Heart attack.  Taking too much of the medicine (overdose). This can lead to death. What should I do to stay safe while taking pain medicine? Take your medicine as told  Take pain medicine exactly as told by your doctor. Take it only when you need it.  Write down the times when you take your pain medicine. Look at the times before you take your next dose.  Take other over-the-counter or prescription medicines only as told by your doctor. ? If your pain medicine has acetaminophen in it, do not take any other acetaminophen while you are taking this medicine. Too much can damage  the liver.  Get pain medicine prescriptions from only one doctor. Avoid certain activities While you are taking prescription pain medicine, and for 8 hours after your last dose:  Do not drive.  Do not use machinery.  Do not use power tools.  Do not sign legal documents.  Do not drink alcohol.  Do not take sleeping pills.  Do not take care of children by yourself.  Do not do any activities that involve climbing or being in high places.  Do not go into any body of water unless there is an adult nearby who can watch you and help you if needed. This includes: ? Saline. ? Rivers. ? Oceans. ? Spas. ? Swimming pools.  Keep others safe  Store your medicine as told by your doctor. Keep it where children and pets cannot reach it.  Do not share your pain medicine with anyone.  Do not save any leftover pills. If you have leftover pills, you can: ? Bring them to a take-back program. ? Bring them to a pharmacy that has a drug disposal container. ? Throw them in the trash. Check the medicine label or package insert to see if it is safe to throw it out. If it is safe, take the medicine out of the container. Mix it with something that makes it unusable, such as pet waste. Then put the medicine in the trash. General instructions  Talk with your doctor about other ways to manage your pain.  If you have trouble pooping: ? Drink enough fluid to keep your pee (urine) pale yellow. ?  Use a poop (stool) softener as told by your doctor. ? Eat more fruits and vegetables.  Keep all follow-up visits as told by your doctor. This is important. Contact a doctor if:  Your medicine is not helping with your pain.  You have a rash.  You feel depressed. Get help right away if: Seek medical care right away if you are taking pain medicines and you (or people close to you) notice any of the following:  Trouble breathing.  Breathing that is shorter than normal.  Breathing that is more shallow  than normal.  Confusion.  Sleepiness.  Trouble staying awake.  Feeling sick to your stomach.  Throwing up.  Your skin or lips turning pale or bluish in color.  Tongue swelling. If you ever feel like you may hurt yourself or others, or have thoughts about taking your own life, get help right away. Go to your nearest emergency department or call:  Your local emergency services (911 in the [place]  A suicide crisis helpline, such as the [place] at (680) 859-0125. This is open 24 hours a day. Summary  Take your pain medicine exactly as told by your doctor.  Pain medicine can help lower your pain. It may also cause side effects.  Talk with your doctor about other ways to manage your pain.  Follow your doctor's instructions about how to take your pain medicine and keep others safe. Ask what activities you should avoid while taking pain medicine. This information is not intended to replace advice given to you by your health care provider. Make sure you discuss any questions you have with your health care provider. Document Released: 12/24/2007 Document Revised: 02/16/2017 Document Reviewed: 02/16/2017 Elsevier Interactive Patient Education  2019 [employer].

## 2018-09-06 NOTE — Progress Notes (Signed)
Pt being d/c to [facility]. [facility] here to get pt. AVS and Norco prescription given to [facility] in packet. Pt's vitals stable, belongings sent with pt. No concerns at this time, family aware.

## 2018-09-06 NOTE — Social Work (Addendum)
10:59am- Pt has Biochemist, clinical for SNF at [facility]. SBIRT completed  CSW continuing to follow for support with disposition when medically appropriate.  Westley Hummer, MSW, [place] Work [phone]

## 2018-09-06 NOTE — Progress Notes (Signed)
[facility] Progress Note  5 Days Post-Op  Subjective: CC-  Sitting up eating breakfast. No complaints. Denies abdominal pain, nausea, vomiting. No output from ostomy. No LE pain at rest, states that pain medications do help when he mobilizes. Hemoglobin 7.3 on 7/15. BP/HR stable last 24 hours.  Working well with therapies. Hoping to go to [facility] soon.  Objective: Vital signs in last 24 hours: Temp:  [98 F (36.7 C)-99.4 F (37.4 C)] 99.4 F (37.4 C) (02/17 0815) Pulse Rate:  [86-98] 95 (02/17 0815) Resp:  [18-20] 20 (02/17 0815) BP: (107-138)/(53-66) 138/66 (02/17 0815) SpO2:  [98 %-100 %] 99 % (02/17 0815) Last BM Date: 09/05/18  Intake/Output from previous day: 02/16 0701 - 02/17 0700 In: 960 [P.O.:960] Out: 1400 [Urine:1400] Intake/Output this shift: No intake/output data recorded.  PE: Gen:  Alert, NAD, pleasant HEENT: EOM's intact, pupils equal and round Card:  RRR Pulm:  CTAB, no W/R/R, effort normal Abd: Soft, NT/ND, +BS, ostomy with some flatus/no stool in pouch Ext:  Mild edema BLE, calves soft and nontender. No gross sensory or motor deficits BLE. 2+ DP pulses bilaterally Skin: no rashes noted, warm and dry  Lab Results:  Recent Labs    09/04/18 0639  WBC 6.9  HGB 7.3*  HCT 22.3*  PLT 142*   BMET No results for input(s): NA, K, CL, CO2, GLUCOSE, BUN, CREATININE, CALCIUM in the last 72 hours. PT/INR No results for input(s): LABPROT, INR in the last 72 hours. CMP     Component Value Date/Time   NA 138 09/03/2018 0350   K 4.0 09/03/2018 0350   CL 109 09/03/2018 0350   CO2 22 09/03/2018 0350   GLUCOSE 141 (H) 09/03/2018 0350   BUN 18 09/03/2018 0350   CREATININE 0.82 09/03/2018 0350   CALCIUM 7.6 (L) 09/03/2018 0350   CALCIUM 8.1 (L) 08/31/2018 0637   PROT 5.0 (L) 08/31/2018 0637   ALBUMIN 2.8 (L) 08/31/2018 0637   AST 43 (H) 08/31/2018 0637   ALT 18 08/31/2018 0637   ALKPHOS 50 08/31/2018 0637   BILITOT 1.9 (H)  08/31/2018 0637   GFRNONAA >60 09/03/2018 0350   GFRAA >60 09/03/2018 0350   Lipase  No results found for: LIPASE     Studies/Results: No results found.  Anti-infectives: Anti-infectives (From admission, onward)   Start     Dose/Rate Route Frequency Ordered Stop   09/01/18 1800  ceFAZolin (ANCEF) IVPB 2g/100 mL premix     2 g 200 mL/hr over 30 Minutes Intravenous Every 8 hours 09/01/18 1505 09/02/18 1714   09/01/18 1118  vancomycin (VANCOCIN) powder  Status:  Discontinued       As needed 09/01/18 1118 09/01/18 1328   09/01/18 1116  vancomycin (VANCOCIN) powder  Status:  Discontinued       As needed 09/01/18 1117 09/01/18 1328   08/31/18 1330  ceFAZolin (ANCEF) IVPB 2g/100 mL premix     2 g 200 mL/hr over 30 Minutes Intravenous To [facility] 08/30/18 2308 09/01/18 1055       Assessment/Plan Fall R distal femur FX -S/P ORIF 2/12 by Dr. Doreatha Martin, WBAT L proximal femur FX-S/P IM nail 2/12 by Dr. Doreatha Martin, WBAT ABL anemia-TF 1u PRBCon 2/13. CBC Pending today AKI-resolved DM- SSI HX colon cancer with colostomy and chronic peristomal hernia FEN-HH diet, KVO IVF, colace/miralax VTE- no Lovenoxuntil Hb stable Foley - condom cath Dispo- CBC pending, plan to start lovenox when hemoglobin stable. Give dulcolax suppository.  SNF when medically stable.  LOS: 7 days    Wellington Hampshire , [facility] Surgery 09/06/2018, 8:33 AM Pager: [phone] Mon-Thurs 7:00 am-4:30 pm Fri 7:00 am -11:30 AM Sat-Sun 7:00 am-11:30 am

## 2018-09-06 NOTE — Social Work (Signed)
Clinical Social Worker facilitated patient discharge including contacting patient family and facility to confirm patient discharge plans.  Clinical information faxed to facility and family agreeable with plan.  CSW arranged ambulance transport via [facility] to [facility] RN to call [phone] with report prior to discharge.  Clinical Social Worker will sign off for now as social work intervention is no longer needed. Please consult us again if new need arises.  Lindie Roberson, MSW, LCSWA Clinical Social Worker [phone]  

## 2018-09-06 NOTE — Plan of Care (Signed)

## 2018-09-06 NOTE — Clinical Social Work Placement (Signed)
   [place] RN to call report to [phone]  Date:  09/06/2018  Patient Details  Name: Ralph Yang MRN: [id number] Date of Birth: [birth year]  Clinical Social Work is seeking post-discharge placement for this patient at the [place] level of care (*CSW will initial, date and re-position this form in  chart as items are completed):      Patient/family provided with [facility] Work Department's list of facilities offering this level of care within the geographic area requested by the patient (or if unable, by the patient's family).      Patient/family informed of their freedom to choose among providers that offer the needed level of care, that participate in Medicare, Medicaid or managed care program needed by the patient, have an available bed and are willing to accept the patient.      Patient/family informed of [facility]'s ownership interest in [facility] and [facility], as well as of the fact that they are under no obligation to receive care at these facilities.  PASRR submitted to EDS on 08/31/18     PASRR number received on       Existing PASRR number confirmed on 08/31/18     FL2 transmitted to all facilities in geographic area requested by pt/family on 09/03/18     FL2 transmitted to all facilities within larger geographic area on       Patient informed that his/her managed care company has contracts with or will negotiate with certain facilities, including the following:        Yes   Patient/family informed of bed offers received.  Patient chooses bed at [facility]     Physician recommends and patient chooses bed at      Patient to be transferred to [facility] on  .  Patient to be transferred to facility by [facility]     Patient family notified on 09/06/18 of transfer.  Name of family member notified:  pt wife [name] via telephone      PHYSICIAN Please prepare priority discharge summary, including medications, Please prepare prescriptions     Additional Comment:    _______________________________________________ [place], [place] 09/06/2018, 11:29 AM

## 2018-09-06 NOTE — Discharge Summary (Signed)
[facility] Discharge Summary   Patient ID: Ralph Yang MRN: [id number] DOB/AGE: [birth year] 85 y.o.  Admit date: 08/30/2018 Discharge date: 09/06/2018  Admitting Diagnosis: Fall from level ground Bilateral femur fractures Transient hypotension  Discharge Diagnosis Patient Active Problem List   Diagnosis Date Noted  . Closed fracture of right distal femur ([place]) 08/31/2018  . Displaced intertrochanteric fracture of left femur, initial encounter for closed fracture ([place]) 08/30/2018  . Hyperlipidemia associated with type 2 diabetes mellitus ([place]) 12/31/2017  . Back pain 12/30/2017  . Failure to thrive in adult 12/26/2017  . Acute bilateral low back pain without sciatica 12/26/2017  . Hypertension   . Diabetes mellitus without complication ([place])   . Arthritis   . Cancer [facility])     Consultants Orthopedics  Imaging: No results found.  Procedures Dr. Doreatha Martin (09/01/18) -  1. CPT 27506-Cephalomedullary nailing of left femur fracture 2. CPT 41937-TKWI reduction internal fixation of right supracondylar distal femur fracture   Hospital Course:  Ralph Yang is an 85yo male who was transferred from [facility] to [facility] 2/10 after falling in his bedroom at home.  He complained of bilateral leg pain.  He was worked up there and had bilateral femur fractures.  Orthopedics was consulted but given his advanced age transfer to a trauma center was requested. He developed hypotension, likely secondary to hypovolemia, and was transfused 2 units PRBCs. Patient was admitted to the trauma ICU for resuscitation and close monitoring. He was taken to the OR 2/12 by orthopedics for the above listed procedure; advised WBAT BLE. Patient was transfused an additional 1 unit PRBCs on POD#1 for acute blood loss anemia. Hemoglobin remained stable after this and he was started on Lovenox 2/17 for DVT prophylaxis.  Patient worked with therapies during this admission who recommended CIR  vs SNF when medically stable for discharge. On 2/17 the patient was voiding well, tolerating diet, working well with therapies, pain well controlled, vital signs stable and felt stable for discharge to SNF.  Patient will follow up as below and knows to call with questions or concerns.    I have personally reviewed the patients medication history on the [place] controlled substance database.    Allergies as of 09/06/2018      Reactions   Sulfa Antibiotics Nausea Only      Medication List    STOP taking these medications   traMADol 50 MG tablet Commonly known as:  ULTRAM     TAKE these medications   acetaminophen 500 MG tablet Commonly known as:  TYLENOL Take 1 tablet (500 mg total) by mouth every 8 (eight) hours. What changed:    medication strength  how much to take  when to take this   docusate sodium 100 MG capsule Commonly known as:  COLACE Take 1 capsule (100 mg total) by mouth 2 (two) times daily.   enoxaparin 40 MG/0.4ML injection Commonly known as:  LOVENOX Inject 0.4 mLs (40 mg total) into the skin daily.   HYDROcodone-acetaminophen 5-325 MG tablet Commonly known as:  NORCO/VICODIN Take 1 tablet by mouth every 6 (six) hours as needed for severe pain.   lisinopril 5 MG tablet Commonly known as:  PRINIVIL,ZESTRIL Take 5 mg by mouth daily.   metFORMIN 500 MG tablet Commonly known as:  GLUCOPHAGE Take 500 mg by mouth 2 (two) times daily with a meal.   multivitamins with iron Tabs tablet Take 1 tablet by mouth daily.   polyethylene glycol packet Commonly known as:  MIRALAX / GLYCOLAX Take 17 g by mouth daily. Start taking on:  September 07, 2018   tamsulosin 0.4 MG Caps capsule Commonly known as:  FLOMAX Take 1 capsule (0.4 mg total) by mouth daily. What changed:  how much to take   VENELEX Oint Apply to sacrum and bilateral buttocks every shift   VITAMIN B 12 PO Take 500 mcg by mouth daily. GUMMIES   Vitamin D (Cholecalciferol) 25 MCG (1000 UT)  Tabs Take 1,000 Units by mouth every morning.         Contact information for follow-up providers    Haddix, Thomasene Lot, MD. Call in 1 week(s).   Specialty:  Orthopedic Surgery Why:  call to arrange follow up with your orthopedic surgeon Contact information: [place] [address] [phone]        Celene Squibb, MD. Call.   Specialty:  Internal Medicine Why:  call to arrange post-hospitalization follow up appointment with your PCP Contact information: [facility] [address] [phone]        [place]. Call.   Why:  as needed, you do not have to schedule an appointment Contact information: [place] [address] [phone]           Contact information for after-discharge care    [facility] .   Service:  Skilled Nursing Contact information: [address] [place] [phone]                  Signed: Wellington Hampshire, [facility] 09/06/2018, 11:16 AM Pager: [phone] Mon-Thurs 7:00 am-4:30 pm Fri 7:00 am -11:30 AM Sat-Sun 7:00 am-11:30 am

## 2018-09-07 ENCOUNTER — Non-Acute Institutional Stay (SKILLED_NURSING_FACILITY): Payer: Medicare HMO | Admitting: Adult Health

## 2018-09-07 ENCOUNTER — Encounter: Payer: Self-pay | Admitting: Adult Health

## 2018-09-07 DIAGNOSIS — S72401S Unspecified fracture of lower end of right femur, sequela: Secondary | ICD-10-CM | POA: Diagnosis not present

## 2018-09-07 DIAGNOSIS — S72142S Displaced intertrochanteric fracture of left femur, sequela: Secondary | ICD-10-CM | POA: Diagnosis not present

## 2018-09-07 DIAGNOSIS — N400 Enlarged prostate without lower urinary tract symptoms: Secondary | ICD-10-CM | POA: Diagnosis not present

## 2018-09-07 DIAGNOSIS — I10 Essential (primary) hypertension: Secondary | ICD-10-CM

## 2018-09-07 DIAGNOSIS — E119 Type 2 diabetes mellitus without complications: Secondary | ICD-10-CM

## 2018-09-07 DIAGNOSIS — D62 Acute posthemorrhagic anemia: Secondary | ICD-10-CM | POA: Diagnosis not present

## 2018-09-07 DIAGNOSIS — I152 Hypertension secondary to endocrine disorders: Secondary | ICD-10-CM

## 2018-09-07 DIAGNOSIS — E1159 Type 2 diabetes mellitus with other circulatory complications: Secondary | ICD-10-CM | POA: Diagnosis not present

## 2018-09-07 DIAGNOSIS — E43 Unspecified severe protein-calorie malnutrition: Secondary | ICD-10-CM

## 2018-09-07 LAB — BPAM RBC
Blood Product Expiration Date: 202002272359
Blood Product Expiration Date: 202003132359
ISSUE DATE / TIME: 202002101859
ISSUE DATE / TIME: 202002141355
Unit Type and Rh: 600
Unit Type and Rh: 6200

## 2018-09-07 LAB — TYPE AND SCREEN
ABO/RH(D): A POS
Antibody Screen: NEGATIVE
Unit division: 0
Unit division: 0

## 2018-09-07 NOTE — Progress Notes (Signed)
Location:   [facility] Room Number: 155 P Place of Service:  SNF (31)   CODE STATUS: Full Code  Allergies  Allergen Reactions  . Sulfa Antibiotics Nausea Only    Chief Complaint  Patient presents with  . Hospitalization Follow-up    Hospital follow up    HPI:  He is a 62 year who had a fall and suffered bilateral lower extremity fractures: the left had left intertochanteric nail; right: ORIF distal femur. He did require 3 units PRBC transfusion. His goal is to return home where he lives with his son. He does have bilateral leg pain. He denies any changes in appetite; denies any heart burn; no insomnia. He will continue to be followed for his chronic illnesses including: hypertension; diabetes; bph   Past Medical History:  Diagnosis Date  . Arthritis   . Cancer ([place])    rectal  . Diabetes mellitus without complication ([place])   . Hypertension     Past Surgical History:  Procedure Laterality Date  . BACK SURGERY    . CHOLECYSTECTOMY    . COLOSTOMY    . COLOSTOMY    . INTRAMEDULLARY (IM) NAIL INTERTROCHANTERIC Left 09/01/2018   Procedure: INTRAMEDULLARY (IM) NAIL INTERTROCHANTRIC;  Surgeon: Shona Needles, MD;  Location: [facility];  Service: Orthopedics;  Laterality: Left;  . ORIF FEMUR FRACTURE Right 09/01/2018   Procedure: OPEN REDUCTION INTERNAL FIXATION (ORIF) DISTAL FEMUR FRACTURE;  Surgeon: Shona Needles, MD;  Location: [facility];  Service: Orthopedics;  Laterality: Right;    Social History   Socioeconomic History  . Marital status: Married    Spouse name: Not on file  . Number of children: Not on file  . Years of education: Not on file  . Highest education level: Not on file  Occupational History  . Not on file  Social Needs  . Financial resource strain: Not on file  . Food insecurity:    Worry: Not on file    Inability: Not on file  . Transportation needs:    Medical: Not on file    Non-medical: Not on file  Tobacco Use  .  Smoking status: Never Smoker  . Smokeless tobacco: Never Used  Substance and Sexual Activity  . Alcohol use: No  . Drug use: No  . Sexual activity: Not on file  Lifestyle  . Physical activity:    Days per week: Not on file    Minutes per session: Not on file  . Stress: Not on file  Relationships  . Social connections:    Talks on phone: Not on file    Gets together: Not on file    Attends religious service: Not on file    Active member of club or organization: Not on file    Attends meetings of clubs or organizations: Not on file    Relationship status: Not on file  . Intimate partner violence:    Fear of current or ex partner: Not on file    Emotionally abused: Not on file    Physically abused: Not on file    Forced sexual activity: Not on file  Other Topics Concern  . Not on file  Social History Narrative  . Not on file   History reviewed. No pertinent family history.    VITAL SIGNS BP 126/71   Pulse 91   Temp (!) 97.2 F (36.2 C)   Resp 18   Ht 5' 8.5" (1.74 m)   Wt 168 lb  9.6 oz (76.5 kg)   SpO2 100%   BMI 25.26 kg/m   Outpatient Encounter Medications as of 09/07/2018  Medication Sig  . acetaminophen (TYLENOL) 500 MG tablet Take 1 tablet (500 mg total) by mouth every 8 (eight) hours.  Roseanne Kaufman Peru-Castor Oil (VENELEX) OINT Apply to sacrum and bilateral buttocks every shift  . Cyanocobalamin (B-12) 500 MCG TABS Give 1 chewable tablet by mouth daily  . docusate sodium (COLACE) 100 MG capsule Take 1 capsule (100 mg total) by mouth 2 (two) times daily.  Marland Kitchen enoxaparin (LOVENOX) 40 MG/0.4ML injection Inject 0.4 mLs (40 mg total) into the skin daily.  Marland Kitchen HYDROcodone-acetaminophen (NORCO/VICODIN) 5-325 MG tablet Take 1 tablet by mouth every 6 (six) hours as needed for severe pain.  Marland Kitchen lisinopril (PRINIVIL,ZESTRIL) 5 MG tablet Take 5 mg by mouth daily.  . metFORMIN (GLUCOPHAGE) 500 MG tablet Take 500 mg by mouth 2 (two) times daily with a meal.   . Multiple  Vitamins-Iron (MULTIVITAMINS WITH IRON) TABS tablet Take 1 tablet by mouth daily.  . polyethylene glycol (MIRALAX / GLYCOLAX) packet Take 17 g by mouth daily.  . tamsulosin (FLOMAX) 0.4 MG CAPS capsule Take 0.4 mg by mouth daily.  . Vitamin D, Cholecalciferol, 1000 UNITS TABS Take 1,000 Units by mouth every morning.   . [DISCONTINUED] tamsulosin (FLOMAX) 0.4 MG CAPS capsule Take 1 capsule (0.4 mg total) by mouth daily. (Patient not taking: Reported on 09/07/2018)   No facility-administered encounter medications on file as of 09/07/2018.      SIGNIFICANT DIAGNOSTIC EXAMS  TODAY:   08-30-18: chest x-ray: No acute abnormalities. Aortic Atherosclerosis   08-30-18: ct of head and cervical spine:  No acute intracranial abnormality. Atrophy, chronic microvascular disease. No evidence of acute traumatic injury to the cervical spine. Multilevel osteoarthritic changes with exaggerated cervical lordosis.  08-30-18: ct of right knee:  Mildly comminuted and impacted fracture through the diaphysis and metaphysis of the distal right femur includes a longitudinal component through the central aspect of femoral trochlea. Findings compatible with a mild impaction fracture through the periphery of the medial patellar facet. The fracture is incomplete. Ligamentous structures appear intact on CT scan. Hematoma in the posterior aspect of the calf.  08-30-18: ct of abdomen and pelvis:  No acute abnormality abdomen or pelvis. Status post sigmoid colectomy. 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 Proximal left femur fracture with surrounding hematoma which is incompletely visualized. Atherosclerosis.  LABS REVIEWED: TODAY:   08-30-18: wbc 9.0; hgb 11.3; hct 53.2; mcv 92.6; plt 169; glucose 173 bun 14; creat 0.91; k+ 4.4; na++ 137 ca 8.8; liver normal albumin 3.3; urine culture: 80,000 staphylococcus saprophyticus  08-31-18: wbc 12.1; hgb 13.4; hct 40.4; mcv 87.4; plt 135; glucose 243;  bun 17; creat 1.48; k+ 5.0; na++ 140; ca 8.2; total bili 1.9; albumin 2.8;   mag 1.3; phos 3.6 ionized ca: 4.5 vit D 36.9;pre-albumin 14.7   hgb a1c 6.6 tsh 0.940; PTH: 11 09-02-18: wbc 8.2; hgb 7.1; hct 21.1; mcv 89.4; plt 105; glucose  227; bun 17; creat 0.98; k+ 4.1; na++ 139; ca 7.5 09-06-18: wbc 7.8; hgb 7.5; hct 22.6; mcv 91.1; plt 218    Review of Systems  Constitutional: Negative for malaise/fatigue.  Respiratory: Negative for cough and shortness of breath.   Cardiovascular: Negative for chest pain, palpitations and leg swelling.  Gastrointestinal: Negative for abdominal pain, constipation and heartburn.  Musculoskeletal: Positive for joint pain. Negative for back pain and myalgias.  Has bilateral leg pain   Skin: Negative.   Neurological: Negative for dizziness.  Psychiatric/Behavioral: The patient is not nervous/anxious.     Physical Exam Constitutional:      General: He is not in acute distress.    Appearance: He is well-developed. He is not diaphoretic.  Neck:     Musculoskeletal: Neck supple.     Thyroid: No thyromegaly.  Cardiovascular:     Rate and Rhythm: Normal rate and regular rhythm.     Pulses: Normal pulses.     Heart sounds: Normal heart sounds.  Pulmonary:     Effort: Pulmonary effort is normal. No respiratory distress.     Breath sounds: Normal breath sounds.  Abdominal:     General: Bowel sounds are normal. There is no distension.     Palpations: Abdomen is soft.     Tenderness: There is no abdominal tenderness.     Comments: Has colostomy present   Musculoskeletal:     Right lower leg: Edema present.     Left lower leg: Edema present.     Comments: Trace bilateral lower extremity edema Is out of bed in wheelchair Has pitting edema to upper thighs and pelvic region   Lymphadenopathy:     Cervical: No cervical adenopathy.  Skin:    General: Skin is warm and dry.     Comments: Bilateral lower extremity incision lines: without signs of infection    Neurological:     Mental Status: He is alert and oriented to person, place, and time.  Psychiatric:        Mood and Affect: Mood normal.        ASSESSMENT/ PLAN:  TODAY:   1. Hypertension associated with type 2 diabetes mellitus: is stable b/p 126/71: will continue lisinopril 5 mg daily   2. Non-insulin dependent type 2 diabetes mellitus: hgb a1c 6.6; will continue metformin 500 mg twice daily  3. Displaced intertrochanteric fracture of left femur, sequela/ closed fracture of right distal femur sequela: is presently stable will follow up with orthopedics and will continue therapy as directed; will continue lovenox 40 mg daily for 30 days; will continue tylenol 500 mg three times daily and will continue vicodin 5/325 mg every 6 hours as needed through 09-13-18.   4. Chronic constipation: is stable will continue miralax daily and colace daily   5. BPH without urinary obstruction: is stable will continue flomax 0.4 mg daily   6.  Acute blood loss anemia: is without change hgb 7.5; will continue MVI with iron daily   7. Severe protein calorie malnutrition: is without change: albumin 2.8 pre-albumin 14.7; will begin prostat 30 cc twice daily   Will check cbc      MD is aware of resident's narcotic use and is in agreement with current plan of care. We will attempt to wean resident as apropriate   Ok Edwards NP [facility]  Contact [phone] Monday through Friday 8am- 5pm  After hours call [phone]

## 2018-09-08 ENCOUNTER — Non-Acute Institutional Stay (SKILLED_NURSING_FACILITY): Payer: Medicare HMO | Admitting: Adult Health

## 2018-09-08 ENCOUNTER — Encounter (HOSPITAL_COMMUNITY)
Admission: RE | Admit: 2018-09-08 | Discharge: 2018-09-08 | Disposition: A | Discharge status: Home / Self Care | Payer: Medicare HMO | Source: Skilled Nursing Facility | Attending: Adult Health | Admitting: Adult Health

## 2018-09-08 ENCOUNTER — Other Ambulatory Visit: Payer: Self-pay | Admitting: Adult Health

## 2018-09-08 ENCOUNTER — Encounter: Payer: Self-pay | Admitting: Adult Health

## 2018-09-08 DIAGNOSIS — I959 Hypotension, unspecified: Secondary | ICD-10-CM | POA: Insufficient documentation

## 2018-09-08 DIAGNOSIS — S72401S Unspecified fracture of lower end of right femur, sequela: Secondary | ICD-10-CM

## 2018-09-08 DIAGNOSIS — S72142S Displaced intertrochanteric fracture of left femur, sequela: Secondary | ICD-10-CM | POA: Diagnosis not present

## 2018-09-08 DIAGNOSIS — M1990 Unspecified osteoarthritis, unspecified site: Secondary | ICD-10-CM | POA: Insufficient documentation

## 2018-09-08 DIAGNOSIS — E119 Type 2 diabetes mellitus without complications: Secondary | ICD-10-CM | POA: Insufficient documentation

## 2018-09-08 DIAGNOSIS — I10 Essential (primary) hypertension: Secondary | ICD-10-CM | POA: Insufficient documentation

## 2018-09-08 DIAGNOSIS — E785 Hyperlipidemia, unspecified: Secondary | ICD-10-CM | POA: Insufficient documentation

## 2018-09-08 DIAGNOSIS — L603 Nail dystrophy: Secondary | ICD-10-CM | POA: Insufficient documentation

## 2018-09-08 LAB — CBC
HCT: 26 % — ABNORMAL LOW (ref 39.0–52.0)
Hemoglobin: 7.9 g/dL — ABNORMAL LOW (ref 13.0–17.0)
MCH: 29 pg (ref 26.0–34.0)
MCHC: 30.4 g/dL (ref 30.0–36.0)
MCV: 95.6 fL (ref 80.0–100.0)
Platelets: 323 10*3/uL (ref 150–400)
RBC: 2.72 MIL/uL — ABNORMAL LOW (ref 4.22–5.81)
RDW: 15.8 % — ABNORMAL HIGH (ref 11.5–15.5)
WBC: 8.9 10*3/uL (ref 4.0–10.5)
nRBC: 0 % (ref 0.0–0.2)

## 2018-09-08 MED ORDER — MORPHINE SULFATE ER 15 MG PO TBCR: 15.0000 mg | EXTENDED_RELEASE_TABLET | Freq: Two times a day (BID) | ORAL | 0 refills | Status: DC

## 2018-09-08 MED ORDER — OXYCODONE HCL 5 MG PO TABS: 5.0000 mg | ORAL_TABLET | ORAL | 0 refills | Status: DC | PRN

## 2018-09-08 NOTE — Progress Notes (Signed)
Location:   [place] Room Number: 155 P Place of Service:  SNF (31)   CODE STATUS: Full Code  Allergies  Allergen Reactions  . Sulfa Antibiotics Nausea Only    Chief Complaint  Patient presents with  . Acute Visit    Pain Management    HPI:  He has suffered bilateral femur fractures. He is presently taking vicodin 5.325 mg every 6 hours as needed for pain; which is not effective in managing his pain. He finds that the pain does interfere with his ability sleep; and is making him nervous   Past Medical History:  Diagnosis Date  . Arthritis   . Cancer ([place])    rectal  . Diabetes mellitus without complication ([place])   . Hypertension     Past Surgical History:  Procedure Laterality Date  . BACK SURGERY    . CHOLECYSTECTOMY    . COLOSTOMY    . COLOSTOMY    . INTRAMEDULLARY (IM) NAIL INTERTROCHANTERIC Left 09/01/2018   Procedure: INTRAMEDULLARY (IM) NAIL INTERTROCHANTRIC;  Surgeon: Shona Needles, MD;  Location: [facility];  Service: Orthopedics;  Laterality: Left;  . ORIF FEMUR FRACTURE Right 09/01/2018   Procedure: OPEN REDUCTION INTERNAL FIXATION (ORIF) DISTAL FEMUR FRACTURE;  Surgeon: Shona Needles, MD;  Location: [facility];  Service: Orthopedics;  Laterality: Right;    Social History   Socioeconomic History  . Marital status: Married    Spouse name: Not on file  . Number of children: Not on file  . Years of education: Not on file  . Highest education level: Not on file  Occupational History  . Not on file  Social Needs  . Financial resource strain: Not on file  . Food insecurity:    Worry: Not on file    Inability: Not on file  . Transportation needs:    Medical: Not on file    Non-medical: Not on file  Tobacco Use  . Smoking status: Never Smoker  . Smokeless tobacco: Never Used  Substance and Sexual Activity  . Alcohol use: No  . Drug use: No  . Sexual activity: Not on file  Lifestyle  . Physical activity:    Days per week:  Not on file    Minutes per session: Not on file  . Stress: Not on file  Relationships  . Social connections:    Talks on phone: Not on file    Gets together: Not on file    Attends religious service: Not on file    Active member of club or organization: Not on file    Attends meetings of clubs or organizations: Not on file    Relationship status: Not on file  . Intimate partner violence:    Fear of current or ex partner: Not on file    Emotionally abused: Not on file    Physically abused: Not on file    Forced sexual activity: Not on file  Other Topics Concern  . Not on file  Social History Narrative  . Not on file   History reviewed. No pertinent family history.    VITAL SIGNS BP 130/69   Pulse 65   Temp 97.6 F (36.4 C)   Resp 18   Ht 5' 8.5" (1.74 m)   Wt 183 lb 9.6 oz (83.3 kg)   BMI 27.51 kg/m   Outpatient Encounter Medications as of 09/08/2018  Medication Sig  . acetaminophen (TYLENOL) 500 MG tablet Take 1 tablet (500 mg total) by  mouth every 8 (eight) hours.  . Amino Acids-Protein Hydrolys (FEEDING SUPPLEMENT, PRO-STAT SUGAR FREE 64,) LIQD Take 30 mLs by mouth 2 (two) times daily.  Roseanne Kaufman [place]-Castor Oil (VENELEX) OINT Apply to sacrum and bilateral buttocks every shift  . Cyanocobalamin (B-12) 500 MCG TABS Give 1 chewable tablet by mouth daily  . docusate sodium (COLACE) 100 MG capsule Take 1 capsule (100 mg total) by mouth 2 (two) times daily.  Marland Kitchen enoxaparin (LOVENOX) 40 MG/0.4ML injection Inject 0.4 mLs (40 mg total) into the skin daily.  Marland Kitchen lisinopril (PRINIVIL,ZESTRIL) 5 MG tablet Take 5 mg by mouth daily.  . metFORMIN (GLUCOPHAGE) 500 MG tablet Take 500 mg by mouth 2 (two) times daily with a meal.   . morphine (MS CONTIN) 15 MG 12 hr tablet Take 1 tablet (15 mg total) by mouth every 12 (twelve) hours for 14 days.  . Multiple Vitamins-Iron (MULTIVITAMINS WITH IRON) TABS tablet Take 1 tablet by mouth daily.  . NON FORMULARY Diet Type:  NAS  . oxyCODONE (OXY  IR/ROXICODONE) 5 MG immediate release tablet Take 1 tablet (5 mg total) by mouth every 4 (four) hours as needed for up to 7 days for severe pain.  . polyethylene glycol (MIRALAX / GLYCOLAX) packet Take 17 g by mouth daily.  . tamsulosin (FLOMAX) 0.4 MG CAPS capsule Take 0.4 mg by mouth daily.  . Vitamin D, Cholecalciferol, 1000 UNITS TABS Take 1,000 Units by mouth every morning.   . [DISCONTINUED] Cyanocobalamin (VITAMIN B 12 PO) Take 500 mcg by mouth daily. GUMMIES   No facility-administered encounter medications on file as of 09/08/2018.      SIGNIFICANT DIAGNOSTIC EXAMS  PREVIOUS:   08-30-18: chest x-ray: No acute abnormalities. Aortic Atherosclerosis   08-30-18: ct of head and cervical spine:  No acute intracranial abnormality. Atrophy, chronic microvascular disease. No evidence of acute traumatic injury to the cervical spine. Multilevel osteoarthritic changes with exaggerated cervical lordosis.  08-30-18: ct of right knee:  Mildly comminuted and impacted fracture through the diaphysis and metaphysis of the distal right femur includes a longitudinal component through the central aspect of femoral trochlea. Findings compatible with a mild impaction fracture through the periphery of the medial patellar facet. The fracture is incomplete. Ligamentous structures appear intact on CT scan. Hematoma in the posterior aspect of the calf.  08-30-18: ct of abdomen and pelvis:  No acute abnormality abdomen or pelvis. Status post sigmoid colectomy. 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 Proximal left femur fracture with surrounding hematoma which is incompletely visualized. Atherosclerosis.  NO NEW EXAMS.   LABS REVIEWED: PREVIOUS:   08-30-18: wbc 9.0; hgb 11.3; hct 53.2; mcv 92.6; plt 169; glucose 173 bun 14; creat 0.91; k+ 4.4; na++ 137 ca 8.8; liver normal albumin 3.3; urine culture: 80,000 staphylococcus saprophyticus  08-31-18: wbc 12.1; hgb 13.4; hct 40.4; mcv  87.4; plt 135; glucose 243; bun 17; creat 1.48; k+ 5.0; na++ 140; ca 8.2; total bili 1.9; albumin 2.8;   mag 1.3; phos 3.6 ionized ca: 4.5 vit D 36.9;pre-albumin 14.7   hgb a1c 6.6 tsh 0.940; PTH: 11 09-02-18: wbc 8.2; hgb 7.1; hct 21.1; mcv 89.4; plt 105; glucose  227; bun 17; creat 0.98; k+ 4.1; na++ 139; ca 7.5 09-06-18: wbc 7.8; hgb 7.5; hct 22.6; mcv 91.1; plt 218   TODAY:   09-08-18: wbc 8.9; hgb 7.9; hct 26.0; mcv 95.6 plt 323    Review of Systems  Constitutional: Negative for malaise/fatigue.  Respiratory: Negative for cough  and shortness of breath.   Cardiovascular: Negative for chest pain, palpitations and leg swelling.  Gastrointestinal: Negative for abdominal pain, constipation and heartburn.  Musculoskeletal: Positive for joint pain. Negative for back pain and myalgias.       Has bilateral leg pain   Skin: Negative.   Neurological: Negative for dizziness.  Psychiatric/Behavioral: The patient is not nervous/anxious.    Physical Exam Constitutional:      General: He is not in acute distress.    Appearance: He is well-developed. He is not diaphoretic.  Neck:     Musculoskeletal: Neck supple.     Thyroid: No thyromegaly.  Cardiovascular:     Rate and Rhythm: Normal rate and regular rhythm.     Pulses: Normal pulses.     Heart sounds: Normal heart sounds.  Pulmonary:     Effort: Pulmonary effort is normal. No respiratory distress.     Breath sounds: Normal breath sounds.  Abdominal:     General: Bowel sounds are normal. There is no distension.     Palpations: Abdomen is soft.     Tenderness: There is no abdominal tenderness.  Musculoskeletal:     Right lower leg: Edema present.     Left lower leg: Edema present.     Comments: Trace bilateral lower extremity edema Is status post bilateral femur fractures Is able to move all extremities   Lymphadenopathy:     Cervical: No cervical adenopathy.  Skin:    General: Skin is warm and dry.  Neurological:     Mental  Status: He is alert and oriented to person, place, and time.  Psychiatric:        Mood and Affect: Mood normal.      ASSESSMENT/ PLAN:  TODAY:   1. Displaced intertrochanteric fracture of left femur, sequela/ closed fracture of right distal femur sequela: is worse due to poorly managed pain:  will follow up with orthopedics and will continue therapy as directed; will continue lovenox 40 mg daily for 30 days; will continue tylenol 500 mg three times daily and will begin MS contin 15 mg every 12 hours through 09-22-18 and will begin oxycodone 5 mg every 4 hours as needed through 09-15-18      MD is aware of resident's narcotic use and is in agreement with current plan of care. We will attempt to wean resident as apropriate   Ok Edwards NP [facility]  Contact [phone] Monday through Friday 8am- 5pm  After hours call [phone]

## 2018-09-09 ENCOUNTER — Non-Acute Institutional Stay (SKILLED_NURSING_FACILITY): Payer: Medicare HMO | Admitting: Internal Medicine

## 2018-09-09 ENCOUNTER — Encounter: Payer: Self-pay | Admitting: Internal Medicine

## 2018-09-09 DIAGNOSIS — S72401S Unspecified fracture of lower end of right femur, sequela: Secondary | ICD-10-CM | POA: Diagnosis not present

## 2018-09-09 DIAGNOSIS — N400 Enlarged prostate without lower urinary tract symptoms: Secondary | ICD-10-CM

## 2018-09-09 DIAGNOSIS — I10 Essential (primary) hypertension: Secondary | ICD-10-CM

## 2018-09-09 DIAGNOSIS — S72142S Displaced intertrochanteric fracture of left femur, sequela: Secondary | ICD-10-CM

## 2018-09-09 DIAGNOSIS — E119 Type 2 diabetes mellitus without complications: Secondary | ICD-10-CM | POA: Diagnosis not present

## 2018-09-09 DIAGNOSIS — D62 Acute posthemorrhagic anemia: Secondary | ICD-10-CM | POA: Diagnosis not present

## 2018-09-09 NOTE — Progress Notes (Signed)
Provider:  Veleta Miners, MD Location:  [facility] Room Number: 155 P Place of Service:  SNF ([phone])  PCP: Celene Squibb, MD Patient Care Team: Celene Squibb, MD as PCP - General (Internal Medicine)  Extended Emergency Contact Information Primary Emergency Contact: Masek,[name] Address: [address]          [place], [place] [address] [place] Phone: [phone] Relation: Spouse Secondary Emergency Contact: Seabron Spates Mobile Phone: [phone] Relation: Niece  Code Status: Full Code Goals of Care: Advanced Directive information Advanced Directives 09/09/2018  Does Patient Have a Medical Advance Directive? No  Type of Advance Directive -  Does patient want to make changes to medical advance directive? -  Would patient like information on creating a medical advance directive? No - Patient declined      Chief Complaint  Patient presents with  . New Admit To SNF    Admission    HPI: Patient is a 85 y.o. male seen today for admission to SNF for therapy Patient was admitted in the hospital from 02/10-02/17 For Bilateral Femur Fractures He has h/o Hypertension Type 2 diabetes, Arthritis, S/P Colostomy Bag for Colon Cancer,and BPH  Patient  Sustained Fall at home while coming out of his bathroom. He called for help from his [facility] and they called EMS. In the ED he was found to have Bilateral Femur Fracture He was Transfused 2 units of blood in the hospital due to Hypotension and Bleed Patient  underwent Cephalomedullary nailing of left femur fracture and Open reduction internal fixation of right Distal Femur on 02/12 He required 1 more Unit of PRBC after surgery He is in SNF for therapy. His pain is controlled on Morphine and Oxycodone PRN He is WBAT   Patient lives with his Wife who is disable also. He walks with the Walker or cane. Does not go out.. Usually they eat Frozen dinners. His Friends take him for his Doctors  appointment   Past Medical History:  Diagnosis Date  . Arthritis   . Cancer ([place])    rectal  . Diabetes mellitus without complication ([place])   . Hypertension    Past Surgical History:  Procedure Laterality Date  . BACK SURGERY    . CHOLECYSTECTOMY    . COLOSTOMY    . COLOSTOMY    . INTRAMEDULLARY (IM) NAIL INTERTROCHANTERIC Left 09/01/2018   Procedure: INTRAMEDULLARY (IM) NAIL INTERTROCHANTRIC;  Surgeon: Shona Needles, MD;  Location: [facility];  Service: Orthopedics;  Laterality: Left;  . ORIF FEMUR FRACTURE Right 09/01/2018   Procedure: OPEN REDUCTION INTERNAL FIXATION (ORIF) DISTAL FEMUR FRACTURE;  Surgeon: Shona Needles, MD;  Location: [facility];  Service: Orthopedics;  Laterality: Right;    reports that he has never smoked. He has never used smokeless tobacco. He reports that he does not drink alcohol or use drugs. Social History   Socioeconomic History  . Marital status: Married    Spouse name: Not on file  . Number of children: Not on file  . Years of education: Not on file  . Highest education level: Not on file  Occupational History  . Not on file  Social Needs  . Financial resource strain: Not on file  . Food insecurity:    Worry: Not on file    Inability: Not on file  . Transportation needs:    Medical: Not on file    Non-medical: Not on file  Tobacco Use  . Smoking status: Never Smoker  .  Smokeless tobacco: Never Used  Substance and Sexual Activity  . Alcohol use: No  . Drug use: No  . Sexual activity: Not on file  Lifestyle  . Physical activity:    Days per week: Not on file    Minutes per session: Not on file  . Stress: Not on file  Relationships  . Social connections:    Talks on phone: Not on file    Gets together: Not on file    Attends religious service: Not on file    Active member of club or organization: Not on file    Attends meetings of clubs or organizations: Not on file    Relationship status: Not on file  . Intimate partner violence:     Fear of current or ex partner: Not on file    Emotionally abused: Not on file    Physically abused: Not on file    Forced sexual activity: Not on file  Other Topics Concern  . Not on file  Social History Narrative  . Not on file    Functional Status Survey:    History reviewed. No pertinent family history.  Health Maintenance  Topic Date Due  . FOOT EXAM  10/06/2018 (Originally 01/03/1944)  . OPHTHALMOLOGY EXAM  10/06/2018 (Originally 01/03/1944)  . URINE MICROALBUMIN  10/06/2018 (Originally 01/03/1944)  . TETANUS/TDAP  10/06/2018 (Originally 01/04/1953)  . PNA vac Low Risk Adult (1 of 2 - PCV13) 10/06/2018 (Originally 01/03/2001)  . HEMOGLOBIN A1C  03/01/2019  . INFLUENZA VACCINE  Discontinued    Allergies  Allergen Reactions  . Sulfa Antibiotics Nausea Only    Outpatient Encounter Medications as of 09/09/2018  Medication Sig  . acetaminophen (TYLENOL) 500 MG tablet Take 1 tablet (500 mg total) by mouth every 8 (eight) hours.  . Amino Acids-Protein Hydrolys (FEEDING SUPPLEMENT, PRO-STAT SUGAR FREE 64,) LIQD Take 30 mLs by mouth 2 (two) times daily.  Roseanne Kaufman [place]-Castor Oil (VENELEX) OINT Apply to sacrum and bilateral buttocks every shift  . Cyanocobalamin (B-12) 500 MCG TABS Give 1 chewable tablet by mouth daily  . docusate sodium (COLACE) 100 MG capsule Take 1 capsule (100 mg total) by mouth 2 (two) times daily.  Marland Kitchen enoxaparin (LOVENOX) 40 MG/0.4ML injection Inject 0.4 mLs (40 mg total) into the skin daily.  Marland Kitchen lisinopril (PRINIVIL,ZESTRIL) 5 MG tablet Take 5 mg by mouth daily.  . metFORMIN (GLUCOPHAGE) 500 MG tablet Take 500 mg by mouth 2 (two) times daily with a meal.   . morphine (MS CONTIN) 15 MG 12 hr tablet Take 1 tablet (15 mg total) by mouth every 12 (twelve) hours for 14 days.  . Multiple Vitamins-Iron (MULTIVITAMINS WITH IRON) TABS tablet Take 1 tablet by mouth daily.  . NON FORMULARY Diet Type:  NAS  . oxyCODONE (OXY IR/ROXICODONE) 5 MG immediate release tablet Take  1 tablet (5 mg total) by mouth every 4 (four) hours as needed for up to 7 days for severe pain.  . polyethylene glycol (MIRALAX / GLYCOLAX) packet Take 17 g by mouth daily.  . tamsulosin (FLOMAX) 0.4 MG CAPS capsule Take 0.4 mg by mouth daily.  . Vitamin D, Cholecalciferol, 1000 UNITS TABS Take 1,000 Units by mouth every morning.    No facility-administered encounter medications on file as of 09/09/2018.     Review of Systems  Review of Systems  Constitutional: Negative for activity change, appetite change, chills, diaphoresis, fatigue and fever.  HENT: Negative for mouth sores, postnasal drip, rhinorrhea, sinus pain and sore throat.  Respiratory: Negative for apnea, cough, chest tightness, shortness of breath and wheezing.   Cardiovascular: Negative for chest pain, palpitations and leg swelling.  Gastrointestinal: Negative for abdominal distention, abdominal pain, constipation, diarrhea, nausea and vomiting.  Genitourinary: Negative for dysuria and frequency.  Musculoskeletal: Negative for arthralgias, joint swelling and myalgias.  Skin: Negative for rash.  Neurological: Negative for dizziness, syncope, weakness, light-headedness and numbness.  Psychiatric/Behavioral: Negative for behavioral problems, confusion and sleep disturbance.    Vitals:   09/09/18 0813  BP: (!) 97/58  Pulse: 93  Resp: 20  Temp: 98.1 F (36.7 C)  Weight: 180 lb 12.8 oz (82 kg)  Height: 5' 8.5" (1.74 m)   Body mass index is 27.09 kg/m. Physical Exam Vitals signs reviewed.  Constitutional:      Appearance: Normal appearance.  HENT:     Head: Normocephalic.     Nose: Nose normal.     Mouth/Throat:     Mouth: Mucous membranes are moist.     Pharynx: Oropharynx is clear.  Eyes:     Pupils: Pupils are equal, round, and reactive to light.  Neck:     Musculoskeletal: Neck supple.  Cardiovascular:     Rate and Rhythm: Normal rate and regular rhythm.     Pulses: Normal pulses.     Heart sounds:  Normal heart sounds.  Pulmonary:     Effort: Pulmonary effort is normal. No respiratory distress.     Breath sounds: Normal breath sounds. No wheezing.  Abdominal:     General: Abdomen is flat. Bowel sounds are normal.     Palpations: Abdomen is soft.     Comments: Colostomy Bag  Musculoskeletal:     Comments: Mild Right Leg Swelling  Skin:    General: Skin is warm and dry.  Neurological:     General: No focal deficit present.     Mental Status: He is alert and oriented to person, place, and time.     Comments: Has left Shoulder Limited Mobility  Psychiatric:        Mood and Affect: Mood normal.        Thought Content: Thought content normal.        Judgment: Judgment normal.     Labs reviewed: Basic Metabolic Panel: Recent Labs    08/31/18 0637 09/01/18 0451 09/02/18 0355 09/03/18 0350  NA 140 139 139 138  K 5.0 4.2 4.1 4.0  CL 106 106 109 109  CO2 21* 23 20* 22  GLUCOSE 243* 145* 227* 141*  BUN 17 14 17 18   CREATININE 1.40* 0.85 0.98 0.82  CALCIUM 8.3*  8.1* 8.0* 7.5* 7.6*  MG 1.3*  --   --   --   PHOS 3.6  --   --   --    Liver Function Tests: Recent Labs    12/26/17 1335 08/30/18 1428 08/31/18 0637  AST 22 22 43*  ALT 19 19 18   ALKPHOS 98 65 50  BILITOT 1.0 0.5 1.9*  PROT 6.6 6.1* 5.0*  ALBUMIN 3.4* 3.3* 2.8*   No results for input(s): LIPASE, AMYLASE in the last 8760 hours. No results for input(s): AMMONIA in the last 8760 hours. CBC: Recent Labs    01/04/18 0725 01/12/18 0724 08/30/18 1428  09/04/18 0639 09/06/18 0935 09/08/18 0600  WBC 14.0* 9.6 9.0   < > 6.9 7.8 8.9  NEUTROABS 9.9* 6.3 5.6  --   --   --   --   HGB 13.5 11.9* 11.3*   < >  7.3* 7.5* 7.9*  HCT 40.9 36.1* 35.2*   < > 22.3* 22.6* 26.0*  MCV 89.9 90.5 92.6   < > 88.5 91.1 95.6  PLT 261 188 169   < > 142* 218 323   < > = values in this interval not displayed.   Cardiac Enzymes: Recent Labs    08/30/18 1428 08/30/18 1733  TROPONINI <0.03 <0.03   BNP: Invalid input(s):  POCBNP Lab Results  Component Value Date   HGBA1C 6.6 (H) 08/31/2018   Lab Results  Component Value Date   TSH 0.940 08/31/2018   No results found for: VITAMINB12 No results found for: FOLATE No results found for: IRON, TIBC, FERRITIN  Imaging and Procedures obtained prior to SNF admission: No results found.  Assessment/Plan  Essential hypertension BP is actually low Discontinue Lisinopril  Non-insulin dependent type 2 diabetes mellitus  BS running in good levels Continue metformin Bilateral Femur Fracture On Lovenox for DVT Prophylaxis Pain Control on Morphine and Norco WBAT Follow up with Ortho  BPH without urinary obstruction On Flomax Stable no Problems  Acute blood loss anemia Will start him on iron Repeat CBC in 1 week     Family/ staff Communication:   Labs/tests ordered: Total time spent in this patient care encounter was 45_ minutes; greater than 50% of the visit spent counseling patient, reviewing records , Labs and coordinating care for problems addressed at this encounter.

## 2018-09-14 ENCOUNTER — Other Ambulatory Visit: Payer: Self-pay | Admitting: Adult Health

## 2018-09-14 ENCOUNTER — Non-Acute Institutional Stay (SKILLED_NURSING_FACILITY): Payer: Medicare HMO | Admitting: Adult Health

## 2018-09-14 ENCOUNTER — Encounter: Payer: Self-pay | Admitting: Adult Health

## 2018-09-14 DIAGNOSIS — S72142S Displaced intertrochanteric fracture of left femur, sequela: Secondary | ICD-10-CM

## 2018-09-14 DIAGNOSIS — E43 Unspecified severe protein-calorie malnutrition: Secondary | ICD-10-CM

## 2018-09-14 DIAGNOSIS — E119 Type 2 diabetes mellitus without complications: Secondary | ICD-10-CM

## 2018-09-14 DIAGNOSIS — S72401S Unspecified fracture of lower end of right femur, sequela: Secondary | ICD-10-CM | POA: Diagnosis not present

## 2018-09-14 MED ORDER — MORPHINE SULFATE ER 30 MG PO TBCR: 30.0000 mg | EXTENDED_RELEASE_TABLET | Freq: Two times a day (BID) | ORAL | 0 refills | Status: DC

## 2018-09-14 MED ORDER — OXYCODONE HCL 5 MG PO TABS: 5.0000 mg | ORAL_TABLET | ORAL | 0 refills | Status: AC | PRN

## 2018-09-14 NOTE — Progress Notes (Signed)
Location:   [facility] Room Number: 155 P Place of Service:  SNF (31)   CODE STATUS: Full Code  Allergies  Allergen Reactions  . Sulfa Antibiotics Nausea Only    Chief Complaint  Patient presents with  . Medical Management of Chronic Issues    Non-insulin dependent type 2 diabetes mellitus; severe protein calorie malnutrition; displaced intertochanteric fracture of left femur, sequela; closed fracture of distal end of right femur unspecified fracture morphology sequela. Weekly follow up for the first 30 days post hospitalization.     HPI:  Ralph Yang is a 85 year old short term rehab patient of this facility being seen for the management of his chronic illnesses: diabetes; malnutrition; right and left femur fractures. Ralph Yang does have uncontrolled pain in both legs; Ralph Yang is able to participate in therapy. Ralph Yang denies any anxiety or insomnia. Ralph Yang does have a poor appetite.   Past Medical History:  Diagnosis Date  . Arthritis   . Cancer ([place])    rectal  . Diabetes mellitus without complication ([place])   . Hypertension     Past Surgical History:  Procedure Laterality Date  . BACK SURGERY    . CHOLECYSTECTOMY    . COLOSTOMY    . COLOSTOMY    . INTRAMEDULLARY (IM) NAIL INTERTROCHANTERIC Left 09/01/2018   Procedure: INTRAMEDULLARY (IM) NAIL INTERTROCHANTRIC;  Surgeon: Shona Needles, MD;  Location: [facility];  Service: Orthopedics;  Laterality: Left;  . ORIF FEMUR FRACTURE Right 09/01/2018   Procedure: OPEN REDUCTION INTERNAL FIXATION (ORIF) DISTAL FEMUR FRACTURE;  Surgeon: Shona Needles, MD;  Location: [facility];  Service: Orthopedics;  Laterality: Right;    Social History   Socioeconomic History  . Marital status: Married    Spouse name: Not on file  . Number of children: Not on file  . Years of education: Not on file  . Highest education level: Not on file  Occupational History  . Not on file  Social Needs  . Financial resource strain: Not on file  . Food  insecurity:    Worry: Not on file    Inability: Not on file  . Transportation needs:    Medical: Not on file    Non-medical: Not on file  Tobacco Use  . Smoking status: Never Smoker  . Smokeless tobacco: Never Used  Substance and Sexual Activity  . Alcohol use: No  . Drug use: No  . Sexual activity: Not on file  Lifestyle  . Physical activity:    Days per week: Not on file    Minutes per session: Not on file  . Stress: Not on file  Relationships  . Social connections:    Talks on phone: Not on file    Gets together: Not on file    Attends religious service: Not on file    Active member of club or organization: Not on file    Attends meetings of clubs or organizations: Not on file    Relationship status: Not on file  . Intimate partner violence:    Fear of current or ex partner: Not on file    Emotionally abused: Not on file    Physically abused: Not on file    Forced sexual activity: Not on file  Other Topics Concern  . Not on file  Social History Narrative  . Not on file   History reviewed. No pertinent family history.    VITAL SIGNS BP 133/77   Pulse 60   Temp  97.8 F (36.6 C)   Resp 19   Ht 5' 8.5" (1.74 m)   Wt 179 lb 12.8 oz (81.6 kg)   BMI 26.94 kg/m   Outpatient Encounter Medications as of 09/14/2018  Medication Sig  . acetaminophen (TYLENOL) 500 MG tablet Take 1 tablet (500 mg total) by mouth every 8 (eight) hours.  . Amino Acids-Protein Hydrolys (FEEDING SUPPLEMENT, PRO-STAT SUGAR FREE 64,) LIQD Take 30 mLs by mouth 2 (two) times daily.  Roseanne Kaufman [place]-Castor Oil (VENELEX) OINT Apply to sacrum and bilateral buttocks every shift  . Cyanocobalamin (B-12) 500 MCG TABS Give 1 chewable tablet by mouth daily  . docusate sodium (COLACE) 100 MG capsule Take 1 capsule (100 mg total) by mouth 2 (two) times daily.  Marland Kitchen enoxaparin (LOVENOX) 40 MG/0.4ML injection Inject 0.4 mLs (40 mg total) into the skin daily.  . ferrous sulfate 325 (65 FE) MG tablet Take 325 mg  by mouth daily with breakfast.  . metFORMIN (GLUCOPHAGE) 500 MG tablet Take 500 mg by mouth 2 (two) times daily with a meal.   . morphine (MS CONTIN) 15 MG 12 hr tablet Take 1 tablet (15 mg total) by mouth every 12 (twelve) hours for 14 days.  . Multiple Vitamins-Iron (MULTIVITAMINS WITH IRON) TABS tablet Take 1 tablet by mouth daily.  . NON FORMULARY Diet Type:  NAS  . oseltamivir (TAMIFLU) 75 MG capsule Take 75 mg by mouth at bedtime.  Marland Kitchen oxyCODONE (OXY IR/ROXICODONE) 5 MG immediate release tablet Take 1 tablet (5 mg total) by mouth every 4 (four) hours as needed for up to 7 days for severe pain.  . polyethylene glycol (MIRALAX / GLYCOLAX) packet Take 17 g by mouth daily.  . tamsulosin (FLOMAX) 0.4 MG CAPS capsule Take 0.4 mg by mouth daily.  . Vitamin D, Cholecalciferol, 1000 UNITS TABS Take 1,000 Units by mouth every morning.   . [DISCONTINUED] lisinopril (PRINIVIL,ZESTRIL) 5 MG tablet Take 5 mg by mouth daily.   No facility-administered encounter medications on file as of 09/14/2018.      SIGNIFICANT DIAGNOSTIC EXAMS  PREVIOUS:   08-30-18: chest x-ray: No acute abnormalities. Aortic Atherosclerosis   08-30-18: ct of head and cervical spine:  No acute intracranial abnormality. Atrophy, chronic microvascular disease. No evidence of acute traumatic injury to the cervical spine. Multilevel osteoarthritic changes with exaggerated cervical lordosis.  08-30-18: ct of right knee:  Mildly comminuted and impacted fracture through the diaphysis and metaphysis of the distal right femur includes a longitudinal component through the central aspect of femoral trochlea. Findings compatible with a mild impaction fracture through the periphery of the medial patellar facet. The fracture is incomplete. Ligamentous structures appear intact on CT scan. Hematoma in the posterior aspect of the calf.  08-30-18: ct of abdomen and pelvis:  No acute abnormality abdomen or pelvis. Status post sigmoid  colectomy. 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 Proximal left femur fracture with surrounding hematoma which is incompletely visualized. Atherosclerosis.  NO NEW EXAMS.   LABS REVIEWED: PREVIOUS:   08-30-18: wbc 9.0; hgb 11.3; hct 53.2; mcv 92.6; plt 169; glucose 173 bun 14; creat 0.91; k+ 4.4; na++ 137 ca 8.8; liver normal albumin 3.3; urine culture: 80,000 staphylococcus saprophyticus  08-31-18: wbc 12.1; hgb 13.4; hct 40.4; mcv 87.4; plt 135; glucose 243; bun 17; creat 1.48; k+ 5.0; na++ 140; ca 8.2; total bili 1.9; albumin 2.8;   mag 1.3; phos 3.6 ionized ca: 4.5 vit D 36.9;pre-albumin 14.7   hgb a1c 6.6  tsh 0.940; PTH: 11 09-02-18: wbc 8.2; hgb 7.1; hct 21.1; mcv 89.4; plt 105; glucose  227; bun 17; creat 0.98; k+ 4.1; na++ 139; ca 7.5 09-06-18: wbc 7.8; hgb 7.5; hct 22.6; mcv 91.1; plt 218  09-08-18: wbc 8.9; hgb 7.9; hct 26.0; mcv 95.6 plt 323   NO NEW LABS.    Review of Systems  Constitutional: Negative for malaise/fatigue.  Respiratory: Negative for cough and shortness of breath.   Cardiovascular: Negative for chest pain, palpitations and leg swelling.  Gastrointestinal: Negative for abdominal pain, constipation and heartburn.  Musculoskeletal: Positive for joint pain and myalgias. Negative for back pain.       Poorly controlled bilateral leg pain   Skin: Negative.   Neurological: Negative for dizziness.  Psychiatric/Behavioral: The patient is not nervous/anxious.     Physical Exam Constitutional:      General: Ralph Yang is not in acute distress.    Appearance: Ralph Yang is well-developed. Ralph Yang is not diaphoretic.  Neck:     Musculoskeletal: Neck supple.     Thyroid: No thyromegaly.  Cardiovascular:     Rate and Rhythm: Normal rate and regular rhythm.     Pulses: Normal pulses.     Heart sounds: Normal heart sounds.  Pulmonary:     Effort: Pulmonary effort is normal. No respiratory distress.     Breath sounds: Normal breath sounds.  Abdominal:     General:  Bowel sounds are normal. There is no distension.     Palpations: Abdomen is soft.     Tenderness: There is no abdominal tenderness.  Musculoskeletal:     Right lower leg: Edema present.     Left lower leg: Edema present.     Comments: race bilateral lower extremity edema Is status post bilateral femur fractures Is able to move all extremities    Lymphadenopathy:     Cervical: No cervical adenopathy.  Skin:    General: Skin is warm and dry.     Comments: Bilateral incision lines without signs of infection present.   Neurological:     Mental Status: Ralph Yang is alert and oriented to person, place, and time.        ASSESSMENT/ PLAN:  TODAY:   1. Displaced intertrochanteric fracture left femur/closed fracture of right distal femur unspecified fracture morphology sequela : is without change; pain is not managed at this time. Will continue therapy as directed and will follow up with orthopedics. Will continue lovenox 40 mg daily for total 30 days; will continue tylenol 500 mg three times daily will continue oxycodone 5 mg every 4 hours as needed through 09-21-18; will increase MS Contin to 30 mg twice daily through 09-28-18.   2. Non-insulin dependent type 2 diabetes mellitus: hgb a1c 6.8; is stable will continue metformin 500 mg twice daily   3. Severe protein calorie  malnutrition: is without change: albumin 2.8; pre-albumin 14.7; will continue prostat twice daily    TODAY:   4. Hypertension associated with type 2 diabetes mellitus: is stable b/p 126/71: will continue lisinopril 5 mg daily   5. Chronic constipation: is stable will continue miralax daily and colace daily   6. BPH without urinary obstruction: is stable will continue flomax 0.4 mg daily   7.  Acute blood loss anemia: is without change hgb 7.5; will continue MVI with iron daily     MD is aware of resident's narcotic use and is in agreement with current plan of care. We will attempt to wean resident as apropriate  Ok Edwards NP [facility]  Contact [phone] Monday through Friday 8am- 5pm  After hours call [phone]

## 2018-09-21 ENCOUNTER — Encounter: Payer: Self-pay | Admitting: Adult Health

## 2018-09-21 ENCOUNTER — Non-Acute Institutional Stay (SKILLED_NURSING_FACILITY): Payer: Medicare HMO | Admitting: Adult Health

## 2018-09-21 DIAGNOSIS — E1159 Type 2 diabetes mellitus with other circulatory complications: Secondary | ICD-10-CM | POA: Diagnosis not present

## 2018-09-21 DIAGNOSIS — I152 Hypertension secondary to endocrine disorders: Secondary | ICD-10-CM

## 2018-09-21 DIAGNOSIS — I10 Essential (primary) hypertension: Secondary | ICD-10-CM | POA: Diagnosis not present

## 2018-09-21 DIAGNOSIS — S72401S Unspecified fracture of lower end of right femur, sequela: Secondary | ICD-10-CM

## 2018-09-21 DIAGNOSIS — S72142S Displaced intertrochanteric fracture of left femur, sequela: Secondary | ICD-10-CM

## 2018-09-21 DIAGNOSIS — K5909 Other constipation: Secondary | ICD-10-CM

## 2018-09-21 DIAGNOSIS — F321 Major depressive disorder, single episode, moderate: Secondary | ICD-10-CM

## 2018-09-21 DIAGNOSIS — R69 Illness, unspecified: Secondary | ICD-10-CM | POA: Diagnosis not present

## 2018-09-21 NOTE — Progress Notes (Signed)
Location:   [facility] Room Number: 155 P Place of Service:  SNF (31)   CODE STATUS: Full Code  Allergies  Allergen Reactions  . Sulfa Antibiotics Nausea Only    Chief Complaint  Patient presents with  . Medical Management of Chronic Issues    Hypertension associated with type 2 diabetes mellitus; displaced intertrochanteric  Fracture of left femur; sequela; closed fracture of distal end of right femur unspecified morphology sequela; chronic constipation. Weekly follow up for the first 30 days post hospitalization     HPI:  He is a short term rehab patient being seen for the management of his chronic illnesses: hypertension; right femur fracture an left femur fracture. He is not participating in therapy; he continues to have poorly managed pain; he feels depression; and does not feel any hope. He denies any suicidal ideation. He is failing.   Past Medical History:  Diagnosis Date  . Arthritis   . Cancer ([place])    rectal  . Diabetes mellitus without complication ([place])   . Hypertension     Past Surgical History:  Procedure Laterality Date  . BACK SURGERY    . CHOLECYSTECTOMY    . COLOSTOMY    . COLOSTOMY    . INTRAMEDULLARY (IM) NAIL INTERTROCHANTERIC Left 09/01/2018   Procedure: INTRAMEDULLARY (IM) NAIL INTERTROCHANTRIC;  Surgeon: Shona Needles, MD;  Location: [place];  Service: Orthopedics;  Laterality: Left;  . ORIF FEMUR FRACTURE Right 09/01/2018   Procedure: OPEN REDUCTION INTERNAL FIXATION (ORIF) DISTAL FEMUR FRACTURE;  Surgeon: Shona Needles, MD;  Location: [place];  Service: Orthopedics;  Laterality: Right;    Social History   Socioeconomic History  . Marital status: Married    Spouse name: Not on file  . Number of children: Not on file  . Years of education: Not on file  . Highest education level: Not on file  Occupational History  . Not on file  Social Needs  . Financial resource strain: Not on file  . Food insecurity:   Worry: Not on file    Inability: Not on file  . Transportation needs:    Medical: Not on file    Non-medical: Not on file  Tobacco Use  . Smoking status: Never Smoker  . Smokeless tobacco: Never Used  Substance and Sexual Activity  . Alcohol use: No  . Drug use: No  . Sexual activity: Not on file  Lifestyle  . Physical activity:    Days per week: Not on file    Minutes per session: Not on file  . Stress: Not on file  Relationships  . Social connections:    Talks on phone: Not on file    Gets together: Not on file    Attends religious service: Not on file    Active member of club or organization: Not on file    Attends meetings of clubs or organizations: Not on file    Relationship status: Not on file  . Intimate partner violence:    Fear of current or ex partner: Not on file    Emotionally abused: Not on file    Physically abused: Not on file    Forced sexual activity: Not on file  Other Topics Concern  . Not on file  Social History Narrative  . Not on file   History reviewed. No pertinent family history.    VITAL SIGNS BP (!) 100/56   Pulse 85   Temp (!) 97.4 F (36.3 C)  Resp 20   Ht 5' 8.5" (1.74 m)   Wt 174 lb (78.9 kg)   BMI 26.07 kg/m   Outpatient Encounter Medications as of 09/21/2018  Medication Sig  . acetaminophen (TYLENOL) 500 MG tablet Take 1 tablet (500 mg total) by mouth every 8 (eight) hours.  . Amino Acids-Protein Hydrolys (FEEDING SUPPLEMENT, PRO-STAT SUGAR FREE 64,) LIQD Take 30 mLs by mouth 2 (two) times daily.  Roseanne Kaufman [place]-Castor Oil (VENELEX) OINT Apply to sacrum and bilateral buttocks every shift  . Cyanocobalamin (B-12) 500 MCG TABS Give 1 chewable tablet by mouth daily  . docusate sodium (COLACE) 100 MG capsule Take 1 capsule (100 mg total) by mouth 2 (two) times daily.  Marland Kitchen enoxaparin (LOVENOX) 40 MG/0.4ML injection Inject 0.4 mLs (40 mg total) into the skin daily.  . ferrous sulfate 325 (65 FE) MG tablet Take 325 mg by mouth daily  with breakfast.  . metFORMIN (GLUCOPHAGE) 500 MG tablet Take 500 mg by mouth 2 (two) times daily with a meal.   . morphine (MS CONTIN) 30 MG 12 hr tablet Take 1 tablet (30 mg total) by mouth every 12 (twelve) hours for 14 days.  . Multiple Vitamins-Iron (MULTIVITAMINS WITH IRON) TABS tablet Take 1 tablet by mouth daily.  . NON FORMULARY Diet Type:  NAS  . oseltamivir (TAMIFLU) 75 MG capsule Take 75 mg by mouth at bedtime.  Marland Kitchen oxyCODONE (OXY IR/ROXICODONE) 5 MG immediate release tablet Take 1 tablet (5 mg total) by mouth every 4 (four) hours as needed for up to 7 days for severe pain.  . polyethylene glycol (MIRALAX / GLYCOLAX) packet Take 17 g by mouth daily.  . tamsulosin (FLOMAX) 0.4 MG CAPS capsule Take 0.4 mg by mouth daily.  . Vitamin D, Cholecalciferol, 1000 UNITS TABS Take 1,000 Units by mouth every morning.    No facility-administered encounter medications on file as of 09/21/2018.      SIGNIFICANT DIAGNOSTIC EXAMS  PREVIOUS:   08-30-18: chest x-ray: No acute abnormalities. Aortic Atherosclerosis   08-30-18: ct of head and cervical spine:  No acute intracranial abnormality. Atrophy, chronic microvascular disease. No evidence of acute traumatic injury to the cervical spine. Multilevel osteoarthritic changes with exaggerated cervical lordosis.  08-30-18: ct of right knee:  Mildly comminuted and impacted fracture through the diaphysis and metaphysis of the distal right femur includes a longitudinal component through the central aspect of femoral trochlea. Findings compatible with a mild impaction fracture through the periphery of the medial patellar facet. The fracture is incomplete. Ligamentous structures appear intact on CT scan. Hematoma in the posterior aspect of the calf.  08-30-18: ct of abdomen and pelvis:  No acute abnormality abdomen or pelvis. Status post sigmoid colectomy. 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 Proximal left femur fracture with  surrounding hematoma which is incompletely visualized. Atherosclerosis.  NO NEW EXAMS.   LABS REVIEWED: PREVIOUS:   08-30-18: wbc 9.0; hgb 11.3; hct 53.2; mcv 92.6; plt 169; glucose 173 bun 14; creat 0.91; k+ 4.4; na++ 137 ca 8.8; liver normal albumin 3.3; urine culture: 80,000 staphylococcus saprophyticus  08-31-18: wbc 12.1; hgb 13.4; hct 40.4; mcv 87.4; plt 135; glucose 243; bun 17; creat 1.48; k+ 5.0; na++ 140; ca 8.2; total bili 1.9; albumin 2.8;   mag 1.3; phos 3.6 ionized ca: 4.5 vit D 36.9;pre-albumin 14.7   hgb a1c 6.6 tsh 0.940; PTH: 11 09-02-18: wbc 8.2; hgb 7.1; hct 21.1; mcv 89.4; plt 105; glucose  227; bun 17; creat 0.98;  k+ 4.1; na++ 139; ca 7.5 09-06-18: wbc 7.8; hgb 7.5; hct 22.6; mcv 91.1; plt 218  09-08-18: wbc 8.9; hgb 7.9; hct 26.0; mcv 95.6 plt 323   NO NEW LABS.   Review of Systems  Constitutional: Negative for malaise/fatigue.  Respiratory: Negative for cough and shortness of breath.   Cardiovascular: Negative for chest pain.  Gastrointestinal: Negative for abdominal pain.  Musculoskeletal: Negative for back pain, joint pain and myalgias.       Has back and hip pain   Skin: Negative.   Neurological: Negative for dizziness.  Psychiatric/Behavioral: Positive for depression. The patient is nervous/anxious.     Physical Exam Constitutional:      General: He is not in acute distress.    Appearance: He is well-developed. He is not diaphoretic.     Comments: thin  Neck:     Musculoskeletal: Neck supple.     Thyroid: No thyromegaly.  Cardiovascular:     Rate and Rhythm: Normal rate and regular rhythm.     Heart sounds: Normal heart sounds.  Pulmonary:     Effort: Pulmonary effort is normal. No respiratory distress.     Breath sounds: Normal breath sounds.  Abdominal:     General: Bowel sounds are normal. There is no distension.     Palpations: Abdomen is soft.     Tenderness: There is no abdominal tenderness.  Musculoskeletal: Normal range of motion.      Comments: Trace bilateral lower extremity edema Is status post bilateral femur fractures Is able to move all extremities     Lymphadenopathy:     Cervical: No cervical adenopathy.  Skin:    General: Skin is warm and dry.     Comments: Bilateral incision lines without signs of infection present   Neurological:     Mental Status: He is alert and oriented to person, place, and time.  Psychiatric:        Mood and Affect: Mood normal.      ASSESSMENT/ PLAN:  TODAY:   1. Displaced intertrochanteric fracture left femur/closed fracture of right distal femur unspecified fracture morphology sequela is stable will continue therapy as directed and will follow up with orthopedics; will continue lovenox 40 mg daily for 30 days; will continue tylenol 500 gm three times daily oxycodone 5 mg every 4 hours as needed for 7 days; and MS contin 30 mg twice daily   Will begin cymbalta 30 mg daily for pain management and depression.   2. Hypertension associated with type 2 diabetes mellitus: is stable b/p 100/56 will continue lisinopril 5 mg daily   3. Chronic constipation: is stable will continue miralax daily and colace daily   4. Depression, major, single episode moderate: is worse: will begin cymbalta 30 mg daily    PREVIOUS:   4. BPH without urinary obstruction: is stable will continue flomax 0.4 mg daily   5.  Acute blood loss anemia: is without change hgb 7.5; will continue MVI with iron daily   6. Non-insulin dependent type 2 diabetes mellitus: hgb a1c 6.8; is stable will continue metformin 500 mg twice daily   7. Severe protein calorie  malnutrition: is without change: albumin 2.8; pre-albumin 14.7; will continue prostat twice daily       MD is aware of resident's narcotic use and is in agreement with current plan of care. We will attempt to wean resident as apropriate   Ralph Edwards NP [facility]  Contact [phone] Monday through Friday 8am- 5pm  After  hours call  [phone]

## 2018-09-22 ENCOUNTER — Non-Acute Institutional Stay (SKILLED_NURSING_FACILITY): Payer: Medicare HMO | Admitting: Adult Health

## 2018-09-22 ENCOUNTER — Encounter: Payer: Self-pay | Admitting: Adult Health

## 2018-09-22 DIAGNOSIS — S72401S Unspecified fracture of lower end of right femur, sequela: Secondary | ICD-10-CM | POA: Diagnosis not present

## 2018-09-22 DIAGNOSIS — R627 Adult failure to thrive: Secondary | ICD-10-CM

## 2018-09-22 DIAGNOSIS — S72142S Displaced intertrochanteric fracture of left femur, sequela: Secondary | ICD-10-CM | POA: Diagnosis not present

## 2018-09-22 NOTE — Progress Notes (Signed)
Location:   [place] Room Number: 155 P Place of Service:  SNF (31)   CODE STATUS: Full Code  Allergies  Allergen Reactions  . Sulfa Antibiotics Nausea Only    Chief Complaint  Patient presents with  . Acute Visit    Care Plan Meeting    HPI:  We have come together for his care plan meeting. His po intake is very poor. He is unable to participate in therapy. He has had vomiting today. There are no reports of fevers; no reports of anxiety no reports of constipation or diarrhea   Past Medical History:  Diagnosis Date  . Arthritis   . Cancer ([place])    rectal  . Diabetes mellitus without complication ([place])   . Hypertension     Past Surgical History:  Procedure Laterality Date  . BACK SURGERY    . CHOLECYSTECTOMY    . COLOSTOMY    . COLOSTOMY    . INTRAMEDULLARY (IM) NAIL INTERTROCHANTERIC Left 09/01/2018   Procedure: INTRAMEDULLARY (IM) NAIL INTERTROCHANTRIC;  Surgeon: Shona Needles, MD;  Location: [place];  Service: Orthopedics;  Laterality: Left;  . ORIF FEMUR FRACTURE Right 09/01/2018   Procedure: OPEN REDUCTION INTERNAL FIXATION (ORIF) DISTAL FEMUR FRACTURE;  Surgeon: Shona Needles, MD;  Location: [place];  Service: Orthopedics;  Laterality: Right;    Social History   Socioeconomic History  . Marital status: Married    Spouse name: Not on file  . Number of children: Not on file  . Years of education: Not on file  . Highest education level: Not on file  Occupational History  . Not on file  Social Needs  . Financial resource strain: Not on file  . Food insecurity:    Worry: Not on file    Inability: Not on file  . Transportation needs:    Medical: Not on file    Non-medical: Not on file  Tobacco Use  . Smoking status: Never Smoker  . Smokeless tobacco: Never Used  Substance and Sexual Activity  . Alcohol use: No  . Drug use: No  . Sexual activity: Not on file  Lifestyle  . Physical activity:    Days per week: Not on file     Minutes per session: Not on file  . Stress: Not on file  Relationships  . Social connections:    Talks on phone: Not on file    Gets together: Not on file    Attends religious service: Not on file    Active member of club or organization: Not on file    Attends meetings of clubs or organizations: Not on file    Relationship status: Not on file  . Intimate partner violence:    Fear of current or ex partner: Not on file    Emotionally abused: Not on file    Physically abused: Not on file    Forced sexual activity: Not on file  Other Topics Concern  . Not on file  Social History Narrative  . Not on file   History reviewed. No pertinent family history.    VITAL SIGNS BP 115/68   Pulse 60   Temp 97.8 F (36.6 C)   Resp 16   Ht 5' 8.5" (1.74 m)   Wt 174 lb (78.9 kg)   BMI 26.07 kg/m   Outpatient Encounter Medications as of 09/22/2018  Medication Sig  . acetaminophen (TYLENOL) 500 MG tablet Take 1 tablet (500 mg total) by mouth every  8 (eight) hours.  . Amino Acids-Protein Hydrolys (FEEDING SUPPLEMENT, PRO-STAT SUGAR FREE 64,) LIQD Take 30 mLs by mouth 2 (two) times daily.  Roseanne Kaufman [place]-Castor Oil (VENELEX) OINT Apply to sacrum and bilateral buttocks every shift  . Cyanocobalamin (B-12) 500 MCG TABS Give 1 chewable tablet by mouth daily  . docusate sodium (COLACE) 100 MG capsule Take 1 capsule (100 mg total) by mouth 2 (two) times daily.  . DULoxetine (CYMBALTA) 30 MG capsule Take 30 mg by mouth daily.  Marland Kitchen enoxaparin (LOVENOX) 40 MG/0.4ML injection Inject 0.4 mLs (40 mg total) into the skin daily.  . ferrous sulfate 325 (65 FE) MG tablet Take 325 mg by mouth daily with breakfast.  . metFORMIN (GLUCOPHAGE) 500 MG tablet Take 500 mg by mouth 2 (two) times daily with a meal.   . morphine (MS CONTIN) 30 MG 12 hr tablet Take 1 tablet (30 mg total) by mouth every 12 (twelve) hours for 14 days.  . Multiple Vitamins-Iron (MULTIVITAMINS WITH IRON) TABS tablet Take 1 tablet by mouth  daily.  . NON FORMULARY Diet Type:  Regular  . oseltamivir (TAMIFLU) 75 MG capsule Take 75 mg by mouth at bedtime.  Marland Kitchen oxyCODONE (ROXICODONE) 5 MG immediate release tablet Take 5 mg by mouth every 4 (four) hours as needed for severe pain.  . polyethylene glycol (MIRALAX / GLYCOLAX) packet Take 17 g by mouth daily.  . tamsulosin (FLOMAX) 0.4 MG CAPS capsule Take 0.4 mg by mouth daily.  . Vitamin D, Cholecalciferol, 1000 UNITS TABS Take 1,000 Units by mouth every morning.    No facility-administered encounter medications on file as of 09/22/2018.      SIGNIFICANT DIAGNOSTIC EXAMS   PREVIOUS:   08-30-18: chest x-ray: No acute abnormalities. Aortic Atherosclerosis   08-30-18: ct of head and cervical spine:  No acute intracranial abnormality. Atrophy, chronic microvascular disease. No evidence of acute traumatic injury to the cervical spine. Multilevel osteoarthritic changes with exaggerated cervical lordosis.  08-30-18: ct of right knee:  Mildly comminuted and impacted fracture through the diaphysis and metaphysis of the distal right femur includes a longitudinal component through the central aspect of femoral trochlea. Findings compatible with a mild impaction fracture through the periphery of the medial patellar facet. The fracture is incomplete. Ligamentous structures appear intact on CT scan. Hematoma in the posterior aspect of the calf.  08-30-18: ct of abdomen and pelvis:  No acute abnormality abdomen or pelvis. Status post sigmoid colectomy. 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 Proximal left femur fracture with surrounding hematoma which is incompletely visualized. Atherosclerosis.  NO NEW EXAMS.   LABS REVIEWED: PREVIOUS:   08-30-18: wbc 9.0; hgb 11.3; hct 53.2; mcv 92.6; plt 169; glucose 173 bun 14; creat 0.91; k+ 4.4; na++ 137 ca 8.8; liver normal albumin 3.3; urine culture: 80,000 staphylococcus saprophyticus  08-31-18: wbc 12.1; hgb 13.4; hct  40.4; mcv 87.4; plt 135; glucose 243; bun 17; creat 1.48; k+ 5.0; na++ 140; ca 8.2; total bili 1.9; albumin 2.8;   mag 1.3; phos 3.6 ionized ca: 4.5 vit D 36.9;pre-albumin 14.7   hgb a1c 6.6 tsh 0.940; PTH: 11 09-02-18: wbc 8.2; hgb 7.1; hct 21.1; mcv 89.4; plt 105; glucose  227; bun 17; creat 0.98; k+ 4.1; na++ 139; ca 7.5 09-06-18: wbc 7.8; hgb 7.5; hct 22.6; mcv 91.1; plt 218  09-08-18: wbc 8.9; hgb 7.9; hct 26.0; mcv 95.6 plt 323   NO NEW LABS.    Review of Systems  Constitutional: Negative  for malaise/fatigue.  Respiratory: Negative for cough and shortness of breath.   Cardiovascular: Negative for chest pain, palpitations and leg swelling.  Gastrointestinal: Positive for nausea and vomiting. Negative for abdominal pain and constipation.  Musculoskeletal: Positive for back pain and joint pain. Negative for myalgias.  Skin: Negative.   Neurological: Negative for dizziness.  Psychiatric/Behavioral: The patient is not nervous/anxious.     Physical Exam Constitutional:      General: He is not in acute distress.    Appearance: He is well-developed. He is not diaphoretic.     Comments: thin  Neck:     Musculoskeletal: Neck supple.     Thyroid: No thyromegaly.  Cardiovascular:     Rate and Rhythm: Normal rate and regular rhythm.     Heart sounds: Normal heart sounds.  Pulmonary:     Effort: Pulmonary effort is normal. No respiratory distress.     Breath sounds: Normal breath sounds.  Abdominal:     General: Bowel sounds are normal. There is no distension.     Palpations: Abdomen is soft.     Tenderness: There is no abdominal tenderness.  Musculoskeletal:     Right lower leg: No edema.     Left lower leg: No edema.     Comments: Is status post bilateral femur fractures Is able to move all extremities      Lymphadenopathy:     Cervical: No cervical adenopathy.  Skin:    General: Skin is warm and dry.     Comments: Bilateral incision lines without signs of infection present     Neurological:     Mental Status: He is alert.  Psychiatric:        Mood and Affect: Mood normal.       ASSESSMENT/ PLAN:  TODAY:   1. Displaced intertrochanteric fracture left femur/closed fracture of right distal femur unspecified fracture morphology sequela  2. Failure to thrive in adult  Will begin zofran 4 mg every 6 hours as needed Will continue to provide supportive care Will continue therapy as he is able to tolerate         MD is aware of resident's narcotic use and is in agreement with current plan of care. We will attempt to wean resident as apropriate   Ok Edwards NP [facility]  Contact [phone] Monday through Friday 8am- 5pm  After hours call [phone]

## 2018-09-23 ENCOUNTER — Encounter (HOSPITAL_COMMUNITY)
Admission: RE | Admit: 2018-09-23 | Discharge: 2018-09-23 | Disposition: A | Discharge status: Home / Self Care | Payer: Medicare HMO | Source: Skilled Nursing Facility | Attending: Internal Medicine | Admitting: Internal Medicine

## 2018-09-23 DIAGNOSIS — Z4789 Encounter for other orthopedic aftercare: Secondary | ICD-10-CM | POA: Insufficient documentation

## 2018-09-23 DIAGNOSIS — F329 Major depressive disorder, single episode, unspecified: Secondary | ICD-10-CM | POA: Insufficient documentation

## 2018-09-23 DIAGNOSIS — E43 Unspecified severe protein-calorie malnutrition: Secondary | ICD-10-CM | POA: Insufficient documentation

## 2018-09-23 LAB — CBC
HCT: 28.3 % — ABNORMAL LOW (ref 39.0–52.0)
Hemoglobin: 8.4 g/dL — ABNORMAL LOW (ref 13.0–17.0)
MCH: 29.3 pg (ref 26.0–34.0)
MCHC: 29.7 g/dL — ABNORMAL LOW (ref 30.0–36.0)
MCV: 98.6 fL (ref 80.0–100.0)
PLATELETS: 310 10*3/uL (ref 150–400)
RBC: 2.87 MIL/uL — ABNORMAL LOW (ref 4.22–5.81)
RDW: 15.8 % — ABNORMAL HIGH (ref 11.5–15.5)
WBC: 6.4 10*3/uL (ref 4.0–10.5)
nRBC: 0 % (ref 0.0–0.2)

## 2018-09-23 LAB — BASIC METABOLIC PANEL
Anion gap: 6 (ref 5–15)
BUN: 22 mg/dL (ref 8–23)
CO2: 27 mmol/L (ref 22–32)
CREATININE: 0.55 mg/dL — AB (ref 0.61–1.24)
Calcium: 8.3 mg/dL — ABNORMAL LOW (ref 8.9–10.3)
Chloride: 104 mmol/L (ref 98–111)
GFR calc Af Amer: >60 mL/min (ref >60)
GFR calc non Af Amer: >60 mL/min (ref >60)
Glucose, Bld: 121 mg/dL — ABNORMAL HIGH (ref 70–99)
Potassium: 4.2 mmol/L (ref 3.5–5.1)
Sodium: 137 mmol/L (ref 135–145)

## 2018-09-27 ENCOUNTER — Non-Acute Institutional Stay (SKILLED_NURSING_FACILITY): Payer: Medicare HMO | Admitting: Adult Health

## 2018-09-27 ENCOUNTER — Encounter: Payer: Self-pay | Admitting: Adult Health

## 2018-09-27 DIAGNOSIS — Z85048 Personal history of other malignant neoplasm of rectum, rectosigmoid junction, and anus: Secondary | ICD-10-CM | POA: Diagnosis not present

## 2018-09-27 DIAGNOSIS — S72401S Unspecified fracture of lower end of right femur, sequela: Secondary | ICD-10-CM

## 2018-09-27 DIAGNOSIS — K5909 Other constipation: Secondary | ICD-10-CM | POA: Insufficient documentation

## 2018-09-27 DIAGNOSIS — R627 Adult failure to thrive: Secondary | ICD-10-CM

## 2018-09-27 DIAGNOSIS — S72142S Displaced intertrochanteric fracture of left femur, sequela: Secondary | ICD-10-CM

## 2018-09-27 DIAGNOSIS — F321 Major depressive disorder, single episode, moderate: Secondary | ICD-10-CM | POA: Insufficient documentation

## 2018-09-27 NOTE — Progress Notes (Signed)
Location:   [facility] Room Number: 155 P Place of Service:  SNF (31)   CODE STATUS: Full Code  Allergies  Allergen Reactions  . Sulfa Antibiotics Nausea Only    Chief Complaint  Patient presents with  . Acute Visit    Change in status    HPI:  He has had a change in status. He is failing. His po intake is very poor. He has remained in bed there are no reports of fevers. He continues to have pain in his hips and back which is not adequately controlled. He does have family at bedside (brother). He tells me that he does not want any aggressive care done. He does not want to go to the hospital; he does not want cpr. His family is wanting care. We have discussed the outcome of care: no lab work removal of all medications that do not promote comfort; verbalized understanding.   Past Medical History:  Diagnosis Date  . Arthritis   . Cancer ([place])    rectal  . Diabetes mellitus without complication ([place])   . Hypertension     Past Surgical History:  Procedure Laterality Date  . BACK SURGERY    . CHOLECYSTECTOMY    . COLOSTOMY    . COLOSTOMY    . INTRAMEDULLARY (IM) NAIL INTERTROCHANTERIC Left 09/01/2018   Procedure: INTRAMEDULLARY (IM) NAIL INTERTROCHANTRIC;  Surgeon: Shona Needles, MD;  Location: [facility];  Service: Orthopedics;  Laterality: Left;  . ORIF FEMUR FRACTURE Right 09/01/2018   Procedure: OPEN REDUCTION INTERNAL FIXATION (ORIF) DISTAL FEMUR FRACTURE;  Surgeon: Shona Needles, MD;  Location: [facility];  Service: Orthopedics;  Laterality: Right;    Social History   Socioeconomic History  . Marital status: Married    Spouse name: Not on file  . Number of children: Not on file  . Years of education: Not on file  . Highest education level: Not on file  Occupational History  . Not on file  Social Needs  . Financial resource strain: Not on file  . Food insecurity:    Worry: Not on file    Inability: Not on file  . Transportation needs:   Medical: Not on file    Non-medical: Not on file  Tobacco Use  . Smoking status: Never Smoker  . Smokeless tobacco: Never Used  Substance and Sexual Activity  . Alcohol use: No  . Drug use: No  . Sexual activity: Not on file  Lifestyle  . Physical activity:    Days per week: Not on file    Minutes per session: Not on file  . Stress: Not on file  Relationships  . Social connections:    Talks on phone: Not on file    Gets together: Not on file    Attends religious service: Not on file    Active member of club or organization: Not on file    Attends meetings of clubs or organizations: Not on file    Relationship status: Not on file  . Intimate partner violence:    Fear of current or ex partner: Not on file    Emotionally abused: Not on file    Physically abused: Not on file    Forced sexual activity: Not on file  Other Topics Concern  . Not on file  Social History Narrative  . Not on file   History reviewed. No pertinent family history.    VITAL SIGNS BP 107/72   Pulse 85  Temp 98 F (36.7 C)   Resp 20   Ht 5' 8.5" (1.74 m)   Wt 174 lb (78.9 kg)   BMI 26.07 kg/m   Outpatient Encounter Medications as of 09/27/2018  Medication Sig  . acetaminophen (TYLENOL) 500 MG tablet Take 1 tablet (500 mg total) by mouth every 8 (eight) hours.  . Amino Acids-Protein Hydrolys (FEEDING SUPPLEMENT, PRO-STAT SUGAR FREE 64,) LIQD Take 30 mLs by mouth 2 (two) times daily.  Roseanne Kaufman Peru-Castor Oil (VENELEX) OINT Apply to sacrum and bilateral buttocks every shift  . Cyanocobalamin (B-12) 500 MCG TABS Give 1 chewable tablet by mouth daily  . docusate sodium (COLACE) 100 MG capsule Take 1 capsule (100 mg total) by mouth 2 (two) times daily.  . DULoxetine (CYMBALTA) 30 MG capsule Take 30 mg by mouth daily.  Marland Kitchen enoxaparin (LOVENOX) 40 MG/0.4ML injection Inject 0.4 mLs (40 mg total) into the skin daily.  . ferrous sulfate 325 (65 FE) MG tablet Take 325 mg by mouth daily with breakfast.  .  morphine (MS CONTIN) 30 MG 12 hr tablet Take 1 tablet (30 mg total) by mouth every 12 (twelve) hours for 14 days.  . Multiple Vitamins-Iron (MULTIVITAMINS WITH IRON) TABS tablet Take 1 tablet by mouth daily.  . NON FORMULARY Diet Type:  Regular  . ondansetron (ZOFRAN) 4 MG tablet Take 4 mg by mouth every 6 (six) hours as needed for nausea or vomiting.  Marland Kitchen oseltamivir (TAMIFLU) 75 MG capsule Take 75 mg by mouth at bedtime.  Marland Kitchen oxycodone (OXY-IR) 5 MG capsule Take 5 mg by mouth daily as needed.  . polyethylene glycol (MIRALAX / GLYCOLAX) packet Take 17 g by mouth daily.  . tamsulosin (FLOMAX) 0.4 MG CAPS capsule Take 0.4 mg by mouth daily.  . Vitamin D, Cholecalciferol, 1000 UNITS TABS Take 1,000 Units by mouth every morning.   . [DISCONTINUED] metFORMIN (GLUCOPHAGE) 500 MG tablet Take 500 mg by mouth 2 (two) times daily with a meal.   . [DISCONTINUED] oxyCODONE (ROXICODONE) 5 MG immediate release tablet Take 5 mg by mouth every 4 (four) hours as needed for severe pain.   No facility-administered encounter medications on file as of 09/27/2018.      SIGNIFICANT DIAGNOSTIC EXAMS    PREVIOUS:   08-30-18: chest x-ray: No acute abnormalities. Aortic Atherosclerosis   08-30-18: ct of head and cervical spine:  No acute intracranial abnormality. Atrophy, chronic microvascular disease. No evidence of acute traumatic injury to the cervical spine. Multilevel osteoarthritic changes with exaggerated cervical lordosis.  08-30-18: ct of right knee:  Mildly comminuted and impacted fracture through the diaphysis and metaphysis of the distal right femur includes a longitudinal component through the central aspect of femoral trochlea. Findings compatible with a mild impaction fracture through the periphery of the medial patellar facet. The fracture is incomplete. Ligamentous structures appear intact on CT scan. Hematoma in the posterior aspect of the calf.  08-30-18: ct of abdomen and pelvis:  No acute  abnormality abdomen or pelvis. Status post sigmoid colectomy. 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 Proximal left femur fracture with surrounding hematoma which is incompletely visualized. Atherosclerosis.  NO NEW EXAMS.   LABS REVIEWED: PREVIOUS:   08-30-18: wbc 9.0; hgb 11.3; hct 53.2; mcv 92.6; plt 169; glucose 173 bun 14; creat 0.91; k+ 4.4; na++ 137 ca 8.8; liver normal albumin 3.3; urine culture: 80,000 staphylococcus saprophyticus  08-31-18: wbc 12.1; hgb 13.4; hct 40.4; mcv 87.4; plt 135; glucose 243; bun 17; creat  1.48; k+ 5.0; na++ 140; ca 8.2; total bili 1.9; albumin 2.8;   mag 1.3; phos 3.6 ionized ca: 4.5 vit D 36.9;pre-albumin 14.7   hgb a1c 6.6 tsh 0.940; PTH: 11 09-02-18: wbc 8.2; hgb 7.1; hct 21.1; mcv 89.4; plt 105; glucose  227; bun 17; creat 0.98; k+ 4.1; na++ 139; ca 7.5 09-06-18: wbc 7.8; hgb 7.5; hct 22.6; mcv 91.1; plt 218  09-08-18: wbc 8.9; hgb 7.9; hct 26.0; mcv 95.6 plt 323   TODAY;   09-23-18: wbc 6.4; hgb 8.4; hct 28.3; mcv 98.6; plt 310; glucose 121 bun 22; creat 0.55; k+ 4.2; na++ 137; ca 8.3   Review of Systems  Reason unable to perform ROS: is lethargic today but does have back and hip pain     Physical Exam Constitutional:      General: He is not in acute distress.    Appearance: He is well-developed. He is not diaphoretic.     Comments: Frail   Neck:     Musculoskeletal: Neck supple.     Thyroid: No thyromegaly.  Cardiovascular:     Rate and Rhythm: Normal rate and regular rhythm.     Pulses: Normal pulses.     Heart sounds: Normal heart sounds.  Pulmonary:     Effort: Pulmonary effort is normal. No respiratory distress.     Breath sounds: Normal breath sounds.  Abdominal:     General: Bowel sounds are normal. There is no distension.     Palpations: Abdomen is soft.     Tenderness: There is no abdominal tenderness.     Comments: Colostomy   Musculoskeletal:     Right lower leg: No edema.     Left lower leg: No edema.       Comments: Is status post bilateral femur fractures Is able to move all extremities   Lymphadenopathy:     Cervical: No cervical adenopathy.  Skin:    General: Skin is warm and dry.     Comments: Incision lines without signs of infection present   Neurological:     Mental Status: He is alert. Mental status is at baseline.  Psychiatric:        Mood and Affect: Mood normal.        ASSESSMENT/ PLAN:  TODAY:   1. Displaced intertrochanteric fracture left femur/closed fracture of right distal femur unspecified fracture morphology sequela  2. Failure to thrive in adult 3 history of rectal cancer  At this time will focus his care on comfort Will stop all medications which do not provide comfort Will make DNR: no hospitalizations no lab work  Will begin roxanol 5 mg every 6 hours routinely and every 2 hours as needed  Time spent with patient and family: 40 minutes: (25 minutes spent with advanced directives) discussed his medical status; medications and goals of care did verbalized understanding.         MD is aware of resident's narcotic use and is in agreement with current plan of care. We will attempt to wean resident as apropriate   Ok Edwards NP [facility]  Contact [phone] Monday through Friday 8am- 5pm  After hours call [phone]

## 2018-09-28 ENCOUNTER — Encounter: Payer: Self-pay | Admitting: Adult Health

## 2018-09-28 ENCOUNTER — Other Ambulatory Visit: Payer: Self-pay | Admitting: Adult Health

## 2018-09-28 ENCOUNTER — Non-Acute Institutional Stay (SKILLED_NURSING_FACILITY): Payer: Medicare HMO | Admitting: Adult Health

## 2018-09-28 DIAGNOSIS — S72401S Unspecified fracture of lower end of right femur, sequela: Secondary | ICD-10-CM

## 2018-09-28 DIAGNOSIS — R627 Adult failure to thrive: Secondary | ICD-10-CM

## 2018-09-28 DIAGNOSIS — S72142S Displaced intertrochanteric fracture of left femur, sequela: Secondary | ICD-10-CM

## 2018-09-28 MED ORDER — MORPHINE SULFATE (CONCENTRATE) 20 MG/ML PO SOLN: 5.0000 mg | Freq: Four times a day (QID) | ORAL | 0 refills | Status: DC

## 2018-09-28 NOTE — Progress Notes (Signed)
Location:   [facility] Room Number: 155 P Place of Service:  SNF (31)   CODE STATUS: DNR  Allergies  Allergen Reactions  . Sulfa Antibiotics Nausea Only    Chief Complaint  Patient presents with  . Acute Visit    Family concerns    HPI:  He has had a change in his status. The focus of his care is for comfort only. He has 2 femur fractures with ha history of diabetes and severe malnutrition. We have discussed the survival rates of femur/hip fractures among the geriatric population. We have discussed his right to choose his own care such as getting out of bed and participating in therapy. There are no reports of fevers. His po intake is poor he has been started on roxanol for pain management. He has decided to make himself a comfort care patient. His family is in agreement with this plan of care.   Past Medical History:  Diagnosis Date  . Arthritis   . Cancer ([place])    rectal  . Diabetes mellitus without complication ([place])   . Hypertension     Past Surgical History:  Procedure Laterality Date  . BACK SURGERY    . CHOLECYSTECTOMY    . COLOSTOMY    . COLOSTOMY    . INTRAMEDULLARY (IM) NAIL INTERTROCHANTERIC Left 09/01/2018   Procedure: INTRAMEDULLARY (IM) NAIL INTERTROCHANTRIC;  Surgeon: Shona Needles, MD;  Location: [place];  Service: Orthopedics;  Laterality: Left;  . ORIF FEMUR FRACTURE Right 09/01/2018   Procedure: OPEN REDUCTION INTERNAL FIXATION (ORIF) DISTAL FEMUR FRACTURE;  Surgeon: Shona Needles, MD;  Location: [facility];  Service: Orthopedics;  Laterality: Right;    Social History   Socioeconomic History  . Marital status: Married    Spouse name: Not on file  . Number of children: Not on file  . Years of education: Not on file  . Highest education level: Not on file  Occupational History  . Not on file  Social Needs  . Financial resource strain: Not on file  . Food insecurity:    Worry: Not on file    Inability: Not on file  .  Transportation needs:    Medical: Not on file    Non-medical: Not on file  Tobacco Use  . Smoking status: Never Smoker  . Smokeless tobacco: Never Used  Substance and Sexual Activity  . Alcohol use: No  . Drug use: No  . Sexual activity: Not on file  Lifestyle  . Physical activity:    Days per week: Not on file    Minutes per session: Not on file  . Stress: Not on file  Relationships  . Social connections:    Talks on phone: Not on file    Gets together: Not on file    Attends religious service: Not on file    Active member of club or organization: Not on file    Attends meetings of clubs or organizations: Not on file    Relationship status: Not on file  . Intimate partner violence:    Fear of current or ex partner: Not on file    Emotionally abused: Not on file    Physically abused: Not on file    Forced sexual activity: Not on file  Other Topics Concern  . Not on file  Social History Narrative  . Not on file   History reviewed. No pertinent family history.    VITAL SIGNS BP 126/81   Pulse  86   Temp 98.6 F (37 C)   Resp 20   Ht 5' 8.5" (1.74 m)   Wt 174 lb (78.9 kg)   BMI 26.07 kg/m   Outpatient Encounter Medications as of 09/28/2018  Medication Sig  . Balsam [place]-Castor Oil (VENELEX) OINT Apply to sacrum and bilateral buttocks every shift  . docusate sodium (COLACE) 100 MG capsule Take 1 capsule (100 mg total) by mouth 2 (two) times daily.  Marland Kitchen morphine (ROXANOL) 20 MG/ML concentrated solution Take 0.25 mLs (5 mg total) by mouth every 6 (six) hours. AND EVERY 2 HOURS AS NEEDED  . NON FORMULARY Diet Type:  Regular  . ondansetron (ZOFRAN) 4 MG tablet Take 4 mg by mouth every 6 (six) hours as needed for nausea or vomiting.  . polyethylene glycol (MIRALAX / GLYCOLAX) packet Take 17 g by mouth daily.  . tamsulosin (FLOMAX) 0.4 MG CAPS capsule Take 0.4 mg by mouth daily.  Marland Kitchen UNABLE TO FIND Silver Alginate - Apply to sacral wound and cover with allevyn dressing two  times daily and as needed  . Wound Dressings (ALLEVYN ADHESIVE EX) Apply topically to bilateral heels for prevention.  Change every 3 days and as needed  . [DISCONTINUED] acetaminophen (TYLENOL) 500 MG tablet Take 1 tablet (500 mg total) by mouth every 8 (eight) hours. (Patient not taking: Reported on 09/28/2018)  . [DISCONTINUED] Amino Acids-Protein Hydrolys (FEEDING SUPPLEMENT, PRO-STAT SUGAR FREE 64,) LIQD Take 30 mLs by mouth 2 (two) times daily.  . [DISCONTINUED] Cyanocobalamin (B-12) 500 MCG TABS Give 1 chewable tablet by mouth daily  . [DISCONTINUED] DULoxetine (CYMBALTA) 30 MG capsule Take 30 mg by mouth daily.  . [DISCONTINUED] enoxaparin (LOVENOX) 40 MG/0.4ML injection Inject 0.4 mLs (40 mg total) into the skin daily. (Patient not taking: Reported on 09/28/2018)  . [DISCONTINUED] ferrous sulfate 325 (65 FE) MG tablet Take 325 mg by mouth daily with breakfast.  . [DISCONTINUED] Multiple Vitamins-Iron (MULTIVITAMINS WITH IRON) TABS tablet Take 1 tablet by mouth daily. (Patient not taking: Reported on 09/28/2018)  . [DISCONTINUED] oxycodone (OXY-IR) 5 MG capsule Take 5 mg by mouth daily as needed.  . [DISCONTINUED] Vitamin D, Cholecalciferol, 1000 UNITS TABS Take 1,000 Units by mouth every morning.    No facility-administered encounter medications on file as of 09/28/2018.      SIGNIFICANT DIAGNOSTIC EXAMS  PREVIOUS:   08-30-18: chest x-ray: No acute abnormalities. Aortic Atherosclerosis   08-30-18: ct of head and cervical spine:  No acute intracranial abnormality. Atrophy, chronic microvascular disease. No evidence of acute traumatic injury to the cervical spine. Multilevel osteoarthritic changes with exaggerated cervical lordosis.  08-30-18: ct of right knee:  Mildly comminuted and impacted fracture through the diaphysis and metaphysis of the distal right femur includes a longitudinal component through the central aspect of femoral trochlea. Findings compatible with a mild impaction  fracture through the periphery of the medial patellar facet. The fracture is incomplete. Ligamentous structures appear intact on CT scan. Hematoma in the posterior aspect of the calf.  08-30-18: ct of abdomen and pelvis:  No acute abnormality abdomen or pelvis. Status post sigmoid colectomy. 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 Proximal left femur fracture with surrounding hematoma which is incompletely visualized. Atherosclerosis.  NO NEW EXAMS.   LABS REVIEWED: PREVIOUS:   08-30-18: wbc 9.0; hgb 11.3; hct 53.2; mcv 92.6; plt 169; glucose 173 bun 14; creat 0.91; k+ 4.4; na++ 137 ca 8.8; liver normal albumin 3.3; urine culture: 80,000 staphylococcus saprophyticus  08-31-18:  wbc 12.1; hgb 13.4; hct 40.4; mcv 87.4; plt 135; glucose 243; bun 17; creat 1.48; k+ 5.0; na++ 140; ca 8.2; total bili 1.9; albumin 2.8;   mag 1.3; phos 3.6 ionized ca: 4.5 vit D 36.9;pre-albumin 14.7   hgb a1c 6.6 tsh 0.940; PTH: 11 09-02-18: wbc 8.2; hgb 7.1; hct 21.1; mcv 89.4; plt 105; glucose  227; bun 17; creat 0.98; k+ 4.1; na++ 139; ca 7.5 09-06-18: wbc 7.8; hgb 7.5; hct 22.6; mcv 91.1; plt 218  09-08-18: wbc 8.9; hgb 7.9; hct 26.0; mcv 95.6 plt 323  09-23-18: wbc 6.4; hgb 8.4; hct 28.3; mcv 98.6; plt 310; glucose 121 bun 22; creat 0.55; k+ 4.2; na++ 137; ca 8.3  NO NEW LABS.    Review of Systems  Reason unable to perform ROS: unable to fully participate denies pain at this time.     Physical Exam Constitutional:      General: He is not in acute distress.    Appearance: He is well-developed. He is not diaphoretic.     Comments: Frail   Neck:     Musculoskeletal: Neck supple.     Thyroid: No thyromegaly.  Cardiovascular:     Rate and Rhythm: Normal rate and regular rhythm.     Pulses: Normal pulses.     Heart sounds: Normal heart sounds.  Pulmonary:     Effort: Pulmonary effort is normal. No respiratory distress.     Breath sounds: Normal breath sounds.  Abdominal:      General: Bowel sounds are normal. There is no distension.     Palpations: Abdomen is soft.     Tenderness: There is no abdominal tenderness.     Comments: Colostomy   Musculoskeletal:     Right lower leg: No edema.     Left lower leg: No edema.     Comments: Is status post bilateral femur fractures Is able to move all extremities    Lymphadenopathy:     Cervical: No cervical adenopathy.  Skin:    General: Skin is warm and dry.     Comments: Bilateral incision lines without signs of infection present   Neurological:     Mental Status: He is alert. Mental status is at baseline.  Psychiatric:        Mood and Affect: Mood normal.       ASSESSMENT/ PLAN:  TODAY:   1. Displaced intertrochanteric fracture left femur/closed fracture of right distal femur unspecified fracture morphology sequela  2. Failure to thrive in adult  At this time will continue current medications and current plan of care. The focus of his care is for fomfort only.    Time spent with patient and family: 40 minutes: (25 minutes spent with advanced directives) discussed his medical status; medications and goals of care did verbalized understanding.    MD is aware of resident's narcotic use and is in agreement with current plan of care. We will attempt to wean resident as apropriate   Ok Edwards NP [facility]  Contact [phone] Monday through Friday 8am- 5pm  After hours call [phone]

## 2018-09-30 ENCOUNTER — Encounter: Payer: Self-pay | Admitting: Adult Health

## 2018-09-30 ENCOUNTER — Non-Acute Institutional Stay (SKILLED_NURSING_FACILITY): Payer: Medicare Other | Admitting: Adult Health

## 2018-09-30 ENCOUNTER — Other Ambulatory Visit: Payer: Self-pay | Admitting: Adult Health

## 2018-09-30 DIAGNOSIS — S72142S Displaced intertrochanteric fracture of left femur, sequela: Secondary | ICD-10-CM

## 2018-09-30 DIAGNOSIS — R627 Adult failure to thrive: Secondary | ICD-10-CM | POA: Diagnosis not present

## 2018-09-30 DIAGNOSIS — S72401S Unspecified fracture of lower end of right femur, sequela: Secondary | ICD-10-CM

## 2018-09-30 MED ORDER — MORPHINE SULFATE (CONCENTRATE) 20 MG/ML PO SOLN: 5.0000 mg | ORAL | 0 refills | Status: DC

## 2018-09-30 NOTE — Progress Notes (Signed)
Location:   [facility] Room Number: 155 P Place of Service:  SNF (31)   CODE STATUS: DNR as of 09/27/2018  Allergies  Allergen Reactions  . Sulfa Antibiotics Nausea Only    Chief Complaint  Patient presents with  . Acute Visit    Pain / Anxiety    HPI:  He is taking roxanol 5 mg every 6 hours routinely and every 2 hours as needed. This regimen at this time is managing his pain and anxiety. There are no reports of uncontrolled pain;no reports of fevers; his appetite remains poor; no reports of constipation.   Past Medical History:  Diagnosis Date  . Arthritis   . Cancer ([place])    rectal  . Diabetes mellitus without complication ([place])   . Hypertension     Past Surgical History:  Procedure Laterality Date  . BACK SURGERY    . CHOLECYSTECTOMY    . COLOSTOMY    . COLOSTOMY    . INTRAMEDULLARY (IM) NAIL INTERTROCHANTERIC Left 09/01/2018   Procedure: INTRAMEDULLARY (IM) NAIL INTERTROCHANTRIC;  Surgeon: Shona Needles, MD;  Location: [facility];  Service: Orthopedics;  Laterality: Left;  . ORIF FEMUR FRACTURE Right 09/01/2018   Procedure: OPEN REDUCTION INTERNAL FIXATION (ORIF) DISTAL FEMUR FRACTURE;  Surgeon: Shona Needles, MD;  Location: [facility];  Service: Orthopedics;  Laterality: Right;    Social History   Socioeconomic History  . Marital status: Married    Spouse name: Not on file  . Number of children: Not on file  . Years of education: Not on file  . Highest education level: Not on file  Occupational History  . Not on file  Social Needs  . Financial resource strain: Not on file  . Food insecurity:    Worry: Not on file    Inability: Not on file  . Transportation needs:    Medical: Not on file    Non-medical: Not on file  Tobacco Use  . Smoking status: Never Smoker  . Smokeless tobacco: Never Used  Substance and Sexual Activity  . Alcohol use: No  . Drug use: No  . Sexual activity: Not on file  Lifestyle  . Physical activity:   Days per week: Not on file    Minutes per session: Not on file  . Stress: Not on file  Relationships  . Social connections:    Talks on phone: Not on file    Gets together: Not on file    Attends religious service: Not on file    Active member of club or organization: Not on file    Attends meetings of clubs or organizations: Not on file    Relationship status: Not on file  . Intimate partner violence:    Fear of current or ex partner: Not on file    Emotionally abused: Not on file    Physically abused: Not on file    Forced sexual activity: Not on file  Other Topics Concern  . Not on file  Social History Narrative  . Not on file   History reviewed. No pertinent family history.    VITAL SIGNS BP 115/62   Pulse 60   Temp 97.9 F (36.6 C)   Resp 15   Ht 5' 8.5" (1.74 m)   Wt 174 lb (78.9 kg)   BMI 26.07 kg/m   Outpatient Encounter Medications as of 09/30/2018  Medication Sig  . Balsam [place]-Castor Oil (VENELEX) OINT Apply to sacrum and bilateral buttocks every  shift  . docusate sodium (COLACE) 100 MG capsule Take 1 capsule (100 mg total) by mouth 2 (two) times daily.  Marland Kitchen morphine (ROXANOL) 20 MG/ML concentrated solution Take 0.25 mLs (5 mg total) by mouth every 6 (six) hours. AND EVERY 2 HOURS AS NEEDED  . NON FORMULARY Diet Type:  Regular  . ondansetron (ZOFRAN) 4 MG tablet Take 4 mg by mouth every 6 (six) hours as needed for nausea or vomiting.  . polyethylene glycol (MIRALAX / GLYCOLAX) packet Take 17 g by mouth daily.  . tamsulosin (FLOMAX) 0.4 MG CAPS capsule Take 0.4 mg by mouth daily.  Marland Kitchen UNABLE TO FIND Silver Alginate - Apply to sacral wound and cover with allevyn dressing two times daily and as needed  . Wound Dressings (ALLEVYN ADHESIVE EX) Apply topically to bilateral heels for prevention.  Change every 3 days and as needed   No facility-administered encounter medications on file as of 09/30/2018.      SIGNIFICANT DIAGNOSTIC EXAMS   PREVIOUS:   08-30-18:  chest x-ray: No acute abnormalities. Aortic Atherosclerosis   08-30-18: ct of head and cervical spine:  No acute intracranial abnormality. Atrophy, chronic microvascular disease. No evidence of acute traumatic injury to the cervical spine. Multilevel osteoarthritic changes with exaggerated cervical lordosis.  08-30-18: ct of right knee:  Mildly comminuted and impacted fracture through the diaphysis and metaphysis of the distal right femur includes a longitudinal component through the central aspect of femoral trochlea. Findings compatible with a mild impaction fracture through the periphery of the medial patellar facet. The fracture is incomplete. Ligamentous structures appear intact on CT scan. Hematoma in the posterior aspect of the calf.  08-30-18: ct of abdomen and pelvis:  No acute abnormality abdomen or pelvis. Status post sigmoid colectomy. 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 Proximal left femur fracture with surrounding hematoma which is incompletely visualized. Atherosclerosis.  NO NEW EXAMS.   LABS REVIEWED: PREVIOUS:   08-30-18: wbc 9.0; hgb 11.3; hct 53.2; mcv 92.6; plt 169; glucose 173 bun 14; creat 0.91; k+ 4.4; na++ 137 ca 8.8; liver normal albumin 3.3; urine culture: 80,000 staphylococcus saprophyticus  08-31-18: wbc 12.1; hgb 13.4; hct 40.4; mcv 87.4; plt 135; glucose 243; bun 17; creat 1.48; k+ 5.0; na++ 140; ca 8.2; total bili 1.9; albumin 2.8;   mag 1.3; phos 3.6 ionized ca: 4.5 vit D 36.9;pre-albumin 14.7   hgb a1c 6.6 tsh 0.940; PTH: 11 09-02-18: wbc 8.2; hgb 7.1; hct 21.1; mcv 89.4; plt 105; glucose  227; bun 17; creat 0.98; k+ 4.1; na++ 139; ca 7.5 09-06-18: wbc 7.8; hgb 7.5; hct 22.6; mcv 91.1; plt 218  09-08-18: wbc 8.9; hgb 7.9; hct 26.0; mcv 95.6 plt 323  09-23-18: wbc 6.4; hgb 8.4; hct 28.3; mcv 98.6; plt 310; glucose 121 bun 22; creat 0.55; k+ 4.2; na++ 137; ca 8.3  NO NEW LABS.    Review of Systems  Unable to perform ROS: Medical  condition (unable to fully participate )     Physical Exam Constitutional:      General: He is not in acute distress.    Appearance: He is well-developed. He is not diaphoretic.     Comments: Frail   Neck:     Musculoskeletal: Neck supple.     Thyroid: No thyromegaly.  Cardiovascular:     Rate and Rhythm: Normal rate and regular rhythm.     Pulses: Normal pulses.     Heart sounds: Normal heart sounds.  Pulmonary:  Effort: Pulmonary effort is normal. No respiratory distress.     Breath sounds: Normal breath sounds.  Abdominal:     General: Bowel sounds are normal. There is no distension.     Palpations: Abdomen is soft.     Tenderness: There is no abdominal tenderness.     Comments: Colostomy   Musculoskeletal:     Right lower leg: No edema.     Left lower leg: No edema.     Comments:  Is status post bilateral femur fractures Is able to move all extremities     Lymphadenopathy:     Cervical: No cervical adenopathy.  Skin:    General: Skin is warm and dry.  Neurological:     Mental Status: He is alert. Mental status is at baseline.  Psychiatric:        Mood and Affect: Mood normal.       ASSESSMENT/ PLAN:  TODAY;   1. Displaced intertrochanteric fracture of left femur, sequela 2. Closed fracture of distal end of right femur, unspecified fracture morphology, sequela 3. Failure to thrive adult  Will continue roxanol at the current dosing: 5 mg every 6 hours and every 2 hours as needed.         MD is aware of resident's narcotic use and is in agreement with current plan of care. We will attempt to wean resident as apropriate   Ok Edwards NP [facility]  Contact [phone] Monday through Friday 8am- 5pm  After hours call [phone]

## 2018-10-07 ENCOUNTER — Other Ambulatory Visit: Payer: Self-pay | Admitting: Adult Health

## 2018-10-07 MED ORDER — TAMSULOSIN HCL 0.4 MG PO CAPS: 0.4000 mg | ORAL_CAPSULE | Freq: Every day | ORAL | 0 refills | Status: AC

## 2018-10-07 MED ORDER — MORPHINE SULFATE (CONCENTRATE) 20 MG/ML PO SOLN: 5.0000 mg | ORAL | 0 refills | Status: DC

## 2018-10-08 ENCOUNTER — Encounter: Payer: Self-pay | Admitting: Adult Health

## 2018-10-08 ENCOUNTER — Non-Acute Institutional Stay (SKILLED_NURSING_FACILITY): Payer: Medicare Other | Admitting: Adult Health

## 2018-10-08 DIAGNOSIS — S72142S Displaced intertrochanteric fracture of left femur, sequela: Secondary | ICD-10-CM

## 2018-10-08 DIAGNOSIS — E43 Unspecified severe protein-calorie malnutrition: Secondary | ICD-10-CM | POA: Diagnosis not present

## 2018-10-08 DIAGNOSIS — S72401S Unspecified fracture of lower end of right femur, sequela: Secondary | ICD-10-CM

## 2018-10-08 NOTE — Progress Notes (Signed)
Location:   [facility] Room Number: 155 P Place of Service:  SNF (31)    CODE STATUS: DNR  Allergies  Allergen Reactions  . Sulfa Antibiotics Nausea Only    Chief Complaint  Patient presents with  . Discharge Note    Discharging to Hospice on 10/08/2018    HPI:  He is being discharged to home with hospice care. He will not need dme as hospice will provide. He will not need home health. He will need his prescriptions written. He had been hospitalized for bilateral femur fractures. He was admitted to this facility for short term rehab. He had continued to decline. He does not any further aggressive care to be done. He will follow up with hospice providers upon discharge     Past Medical History:  Diagnosis Date  . Arthritis   . Cancer ([place])    rectal  . Diabetes mellitus without complication ([place])   . Hypertension     Past Surgical History:  Procedure Laterality Date  . BACK SURGERY    . CHOLECYSTECTOMY    . COLOSTOMY    . COLOSTOMY    . INTRAMEDULLARY (IM) NAIL INTERTROCHANTERIC Left 09/01/2018   Procedure: INTRAMEDULLARY (IM) NAIL INTERTROCHANTRIC;  Surgeon: Shona Needles, MD;  Location: [facility];  Service: Orthopedics;  Laterality: Left;  . ORIF FEMUR FRACTURE Right 09/01/2018   Procedure: OPEN REDUCTION INTERNAL FIXATION (ORIF) DISTAL FEMUR FRACTURE;  Surgeon: Shona Needles, MD;  Location: [facility];  Service: Orthopedics;  Laterality: Right;    Social History   Socioeconomic History  . Marital status: Married    Spouse name: Not on file  . Number of children: Not on file  . Years of education: Not on file  . Highest education level: Not on file  Occupational History  . Not on file  Social Needs  . Financial resource strain: Not on file  . Food insecurity:    Worry: Not on file    Inability: Not on file  . Transportation needs:    Medical: Not on file    Non-medical: Not on file  Tobacco Use  . Smoking status: Never Smoker  .  Smokeless tobacco: Never Used  Substance and Sexual Activity  . Alcohol use: No  . Drug use: No  . Sexual activity: Not on file  Lifestyle  . Physical activity:    Days per week: Not on file    Minutes per session: Not on file  . Stress: Not on file  Relationships  . Social connections:    Talks on phone: Not on file    Gets together: Not on file    Attends religious service: Not on file    Active member of club or organization: Not on file    Attends meetings of clubs or organizations: Not on file    Relationship status: Not on file  . Intimate partner violence:    Fear of current or ex partner: Not on file    Emotionally abused: Not on file    Physically abused: Not on file    Forced sexual activity: Not on file  Other Topics Concern  . Not on file  Social History Narrative  . Not on file   History reviewed. No pertinent family history.  VITAL SIGNS BP 111/70   Pulse (!) 102   Temp (!) 97.3 F (36.3 C)   Resp 20   Ht 5' 8.5" (1.74 m)   Wt 174 lb (78.9  kg)   BMI 26.07 kg/m   Patient's Medications  New Prescriptions   No medications on file  Previous Medications   BALSAM PERU-CASTOR OIL (VENELEX) OINT    Apply to sacrum and bilateral buttocks every shift   DOCUSATE SODIUM (COLACE) 100 MG CAPSULE    Take 1 capsule (100 mg total) by mouth 2 (two) times daily.   MORPHINE (ROXANOL) 20 MG/ML CONCENTRATED SOLUTION    Give 5 mg by mouth every 6 hours routinely and every 2 hours as needed   NON FORMULARY    Diet Type:  Regular   ONDANSETRON (ZOFRAN) 4 MG TABLET    Take 4 mg by mouth every 6 (six) hours as needed for nausea or vomiting.   POLYETHYLENE GLYCOL (MIRALAX / GLYCOLAX) PACKET    Take 17 g by mouth daily.   TAMSULOSIN (FLOMAX) 0.4 MG CAPS CAPSULE    Take 1 capsule (0.4 mg total) by mouth daily.   UNABLE TO FIND    Silver Alginate - Apply to sacral wound and cover with allevyn dressing two times daily and as needed   WOUND DRESSINGS (ALLEVYN ADHESIVE EX)    Apply  topically to bilateral heels for prevention.  Change every 3 days and as needed  Modified Medications   No medications on file  Discontinued Medications   MORPHINE (ROXANOL) 20 MG/ML CONCENTRATED SOLUTION    Take 0.25 mLs (5 mg total) by mouth every 4 (four) hours. AND EVERY 2 HOURS AS NEEDED     SIGNIFICANT DIAGNOSTIC EXAMS  PREVIOUS:   08-30-18: chest x-ray: No acute abnormalities. Aortic Atherosclerosis   08-30-18: ct of head and cervical spine:  No acute intracranial abnormality. Atrophy, chronic microvascular disease. No evidence of acute traumatic injury to the cervical spine. Multilevel osteoarthritic changes with exaggerated cervical lordosis.  08-30-18: ct of right knee:  Mildly comminuted and impacted fracture through the diaphysis and metaphysis of the distal right femur includes a longitudinal component through the central aspect of femoral trochlea. Findings compatible with a mild impaction fracture through the periphery of the medial patellar facet. The fracture is incomplete. Ligamentous structures appear intact on CT scan. Hematoma in the posterior aspect of the calf.  08-30-18: ct of abdomen and pelvis:  No acute abnormality abdomen or pelvis. Status post sigmoid colectomy. 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 Proximal left femur fracture with surrounding hematoma which is incompletely visualized. Atherosclerosis.  NO NEW EXAMS.   LABS REVIEWED: PREVIOUS:   08-30-18: wbc 9.0; hgb 11.3; hct 53.2; mcv 92.6; plt 169; glucose 173 bun 14; creat 0.91; k+ 4.4; na++ 137 ca 8.8; liver normal albumin 3.3; urine culture: 80,000 staphylococcus saprophyticus  08-31-18: wbc 12.1; hgb 13.4; hct 40.4; mcv 87.4; plt 135; glucose 243; bun 17; creat 1.48; k+ 5.0; na++ 140; ca 8.2; total bili 1.9; albumin 2.8;   mag 1.3; phos 3.6 ionized ca: 4.5 vit D 36.9;pre-albumin 14.7   hgb a1c 6.6 tsh 0.940; PTH: 11 09-02-18: wbc 8.2; hgb 7.1; hct 21.1; mcv 89.4; plt  105; glucose  227; bun 17; creat 0.98; k+ 4.1; na++ 139; ca 7.5 09-06-18: wbc 7.8; hgb 7.5; hct 22.6; mcv 91.1; plt 218  09-08-18: wbc 8.9; hgb 7.9; hct 26.0; mcv 95.6 plt 323  09-23-18: wbc 6.4; hgb 8.4; hct 28.3; mcv 98.6; plt 310; glucose 121 bun 22; creat 0.55; k+ 4.2; na++ 137; ca 8.3  NO NEW LABS.     Review of Systems  Reason unable to perform ROS: unable  to fully participate     Physical Exam Constitutional:      General: He is not in acute distress.    Appearance: He is well-developed. He is not diaphoretic.     Comments: Frail   Neck:     Musculoskeletal: Neck supple.     Thyroid: No thyromegaly.  Cardiovascular:     Rate and Rhythm: Normal rate and regular rhythm.     Pulses: Normal pulses.     Heart sounds: Normal heart sounds.  Pulmonary:     Effort: Pulmonary effort is normal. No respiratory distress.     Breath sounds: Normal breath sounds.  Abdominal:     General: Bowel sounds are normal. There is no distension.     Palpations: Abdomen is soft.     Tenderness: There is no abdominal tenderness.     Comments: Colostomy   Musculoskeletal:     Right lower leg: No edema.     Left lower leg: No edema.     Comments: Is status post bilateral femur fractures Is able to move all extremities    Lymphadenopathy:     Cervical: No cervical adenopathy.  Skin:    General: Skin is warm and dry.  Neurological:     Mental Status: He is alert.  Psychiatric:        Mood and Affect: Mood normal.        ASSESSMENT/ PLAN:   Patient is being discharged with the following home health services:  None needed will be followed by hospice care   Patient is being discharged with the following durable medical equipment:  None needed   Patient has been advised to f/u with their PCP in 1-2 weeks to bring them up to date on their rehab stay.  Social services at facility was responsible for arranging this appointment.  Pt was provided with a 30 day supply of prescriptions for  medications and refills must be obtained from their PCP.  For controlled substances, a more limited supply may be provided adequate until PCP appointment only.  A 30 day supply of his prescription medications have been sent to [facility] [address] with 15 ml roxanol   Time spent with patient and discharge process: medications; hospice care.    Ok Edwards NP [facility]  Contact [phone] Monday through Friday 8am- 5pm  After hours call [phone]

## 2018-10-12 DIAGNOSIS — R0902 Hypoxemia: Secondary | ICD-10-CM | POA: Diagnosis not present

## 2018-10-12 DIAGNOSIS — R531 Weakness: Secondary | ICD-10-CM | POA: Diagnosis not present

## 2018-10-12 DIAGNOSIS — Z743 Need for continuous supervision: Secondary | ICD-10-CM | POA: Diagnosis not present

## 2018-10-21 ENCOUNTER — Other Ambulatory Visit: Payer: Self-pay | Admitting: Adult Health

## 2018-10-22 ENCOUNTER — Other Ambulatory Visit: Payer: Self-pay | Admitting: Adult Health

## 2018-11-19 DEATH — deceased

## 2019-04-16 IMAGING — DX DG CHEST 1V
1 series · 1 of 1 positions shown · non-contrast
Comparison: Chest x-ray dated 04/05/2018

CLINICAL DATA: Trauma secondary to a fall today. Acute left femur
fracture.

EXAM:
CHEST  1 VIEW

[chest pa]
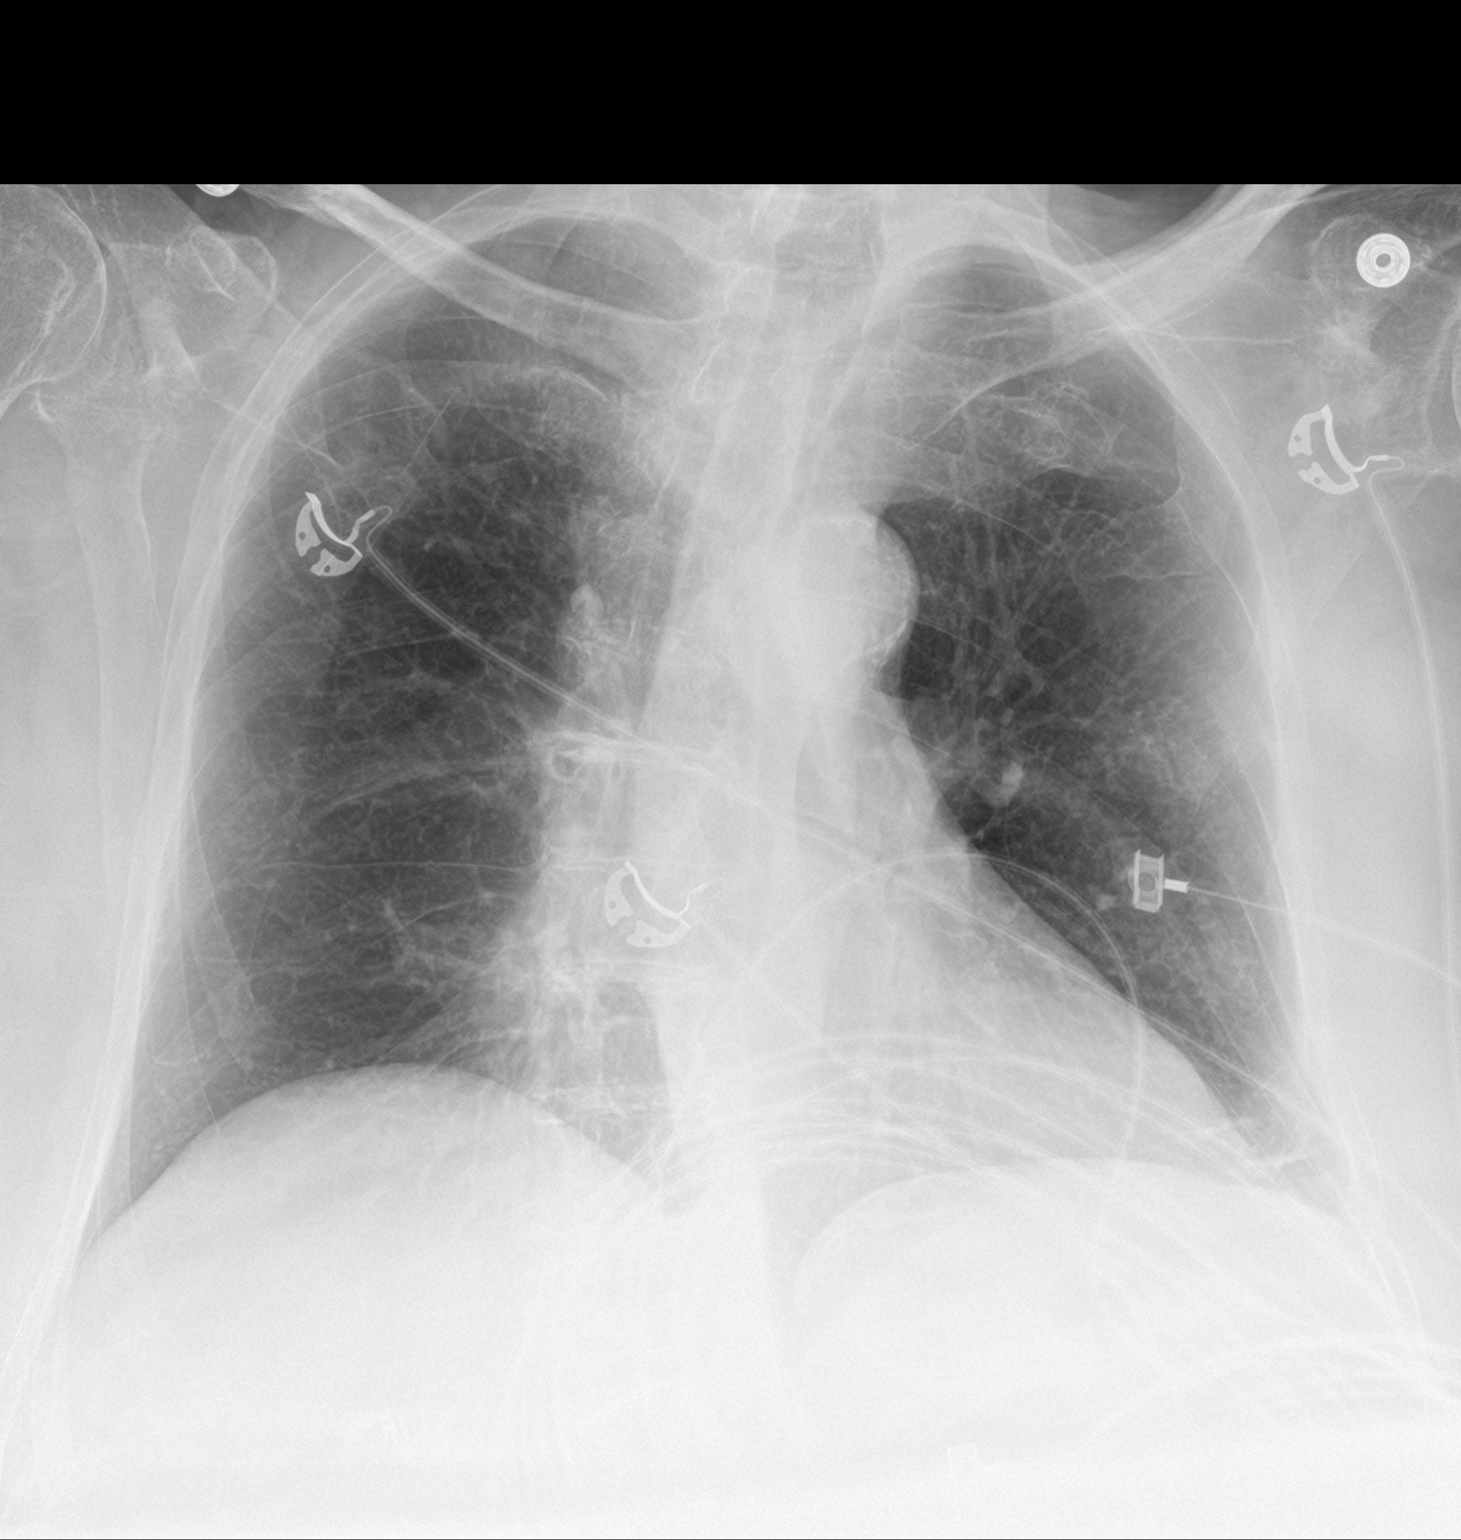

[1 of 1 positions shown; findings below may reference images not displayed]

FINDINGS: The heart size and pulmonary vascularity are normal. Aortic
atherosclerosis. The lungs are clear. No effusions. No acute bone
abnormalities.
IMPRESSION: No acute abnormalities.

Aortic Atherosclerosis ([id number].U).

## 2019-04-16 IMAGING — CT CT KNEE*R* W/O CM
3 of 4 series · 13 of 33 positions shown, 16 images · non-contrast
Comparison: Plain films right knee earlier today.

CLINICAL DATA: Patient suffered a distal left femur fracture in a
fall today. Initial encounter.

EXAM:
CT OF THE RIGHT KNEE WITHOUT CONTRAST
TECHNIQUE: Multidetector CT imaging of the right knee was performed according
to the standard protocol. Multiplanar CT image reconstructions were
also generated.

[Series 5: cor bone · coronal · 0.39mm/px · 3 of 220 slices shown]
[im 69/220  bone]
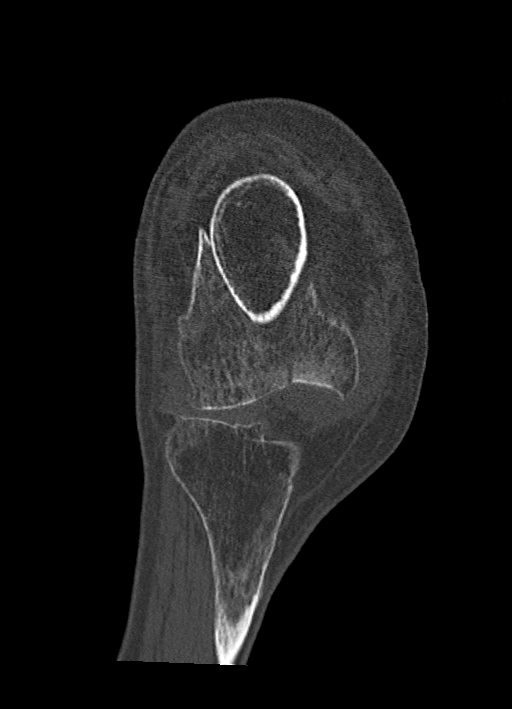
[im 96/220  bone]
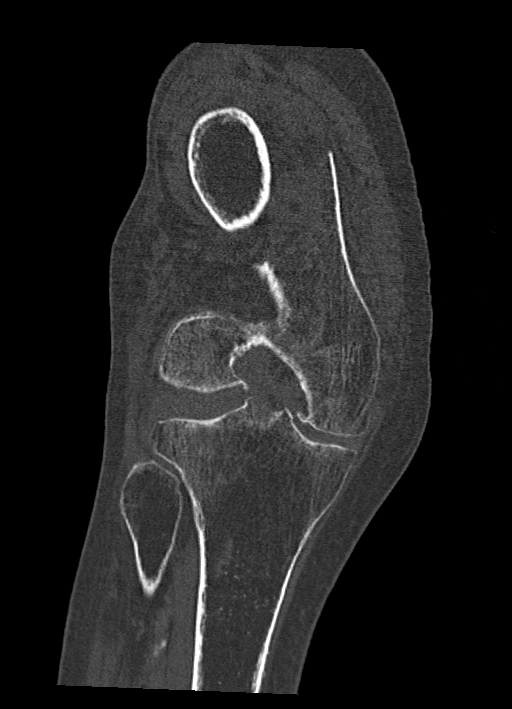
[im 124/220  bone]
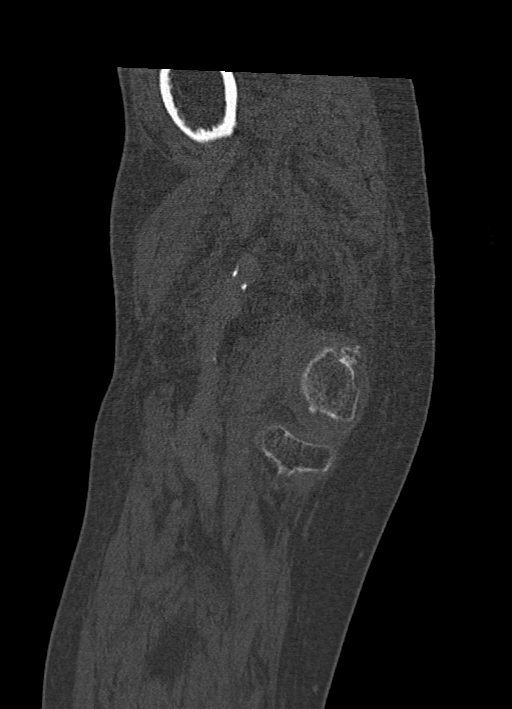

[Series 7: axial st · axial · 0.39mm/px · z∈[-1069,-913]mm · 5 of 244 slices shown, 7 images]
[im 41/244  soft-tissue]
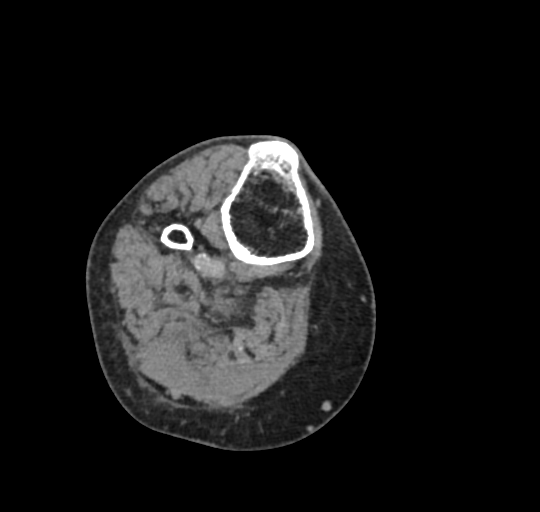
[im 41/244  bone]
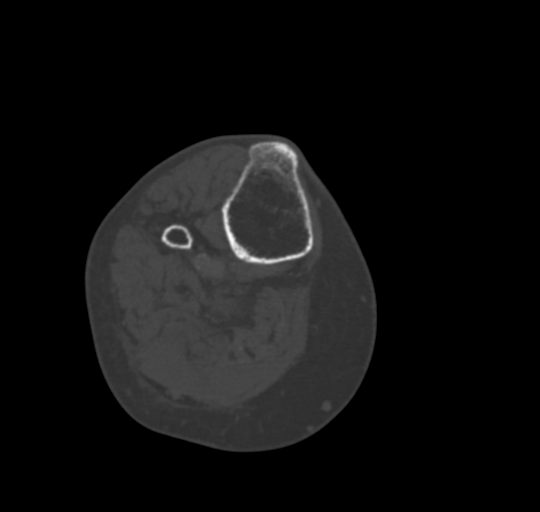
[im 82/244  bone]
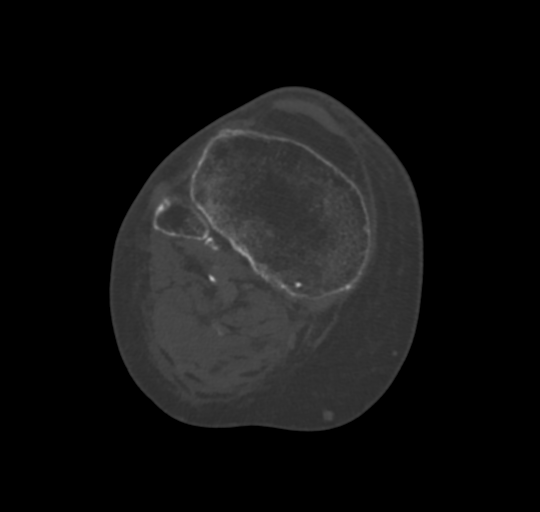
[im 122/244  bone]
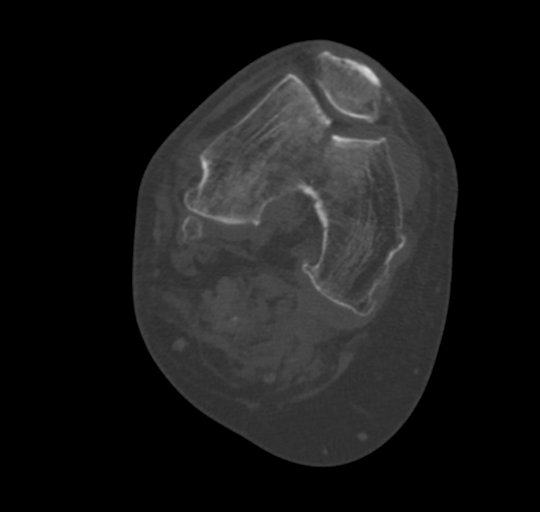
[im 163/244  bone]
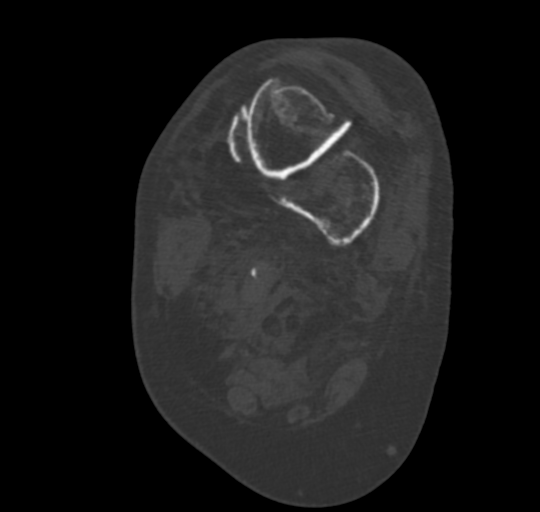
[im 203/244  soft-tissue]
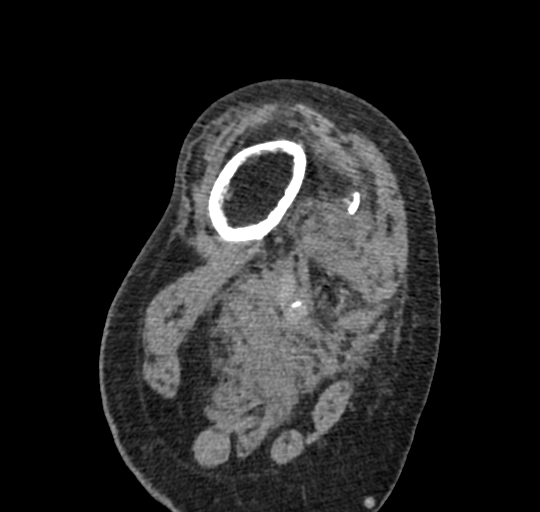
[im 203/244  bone]
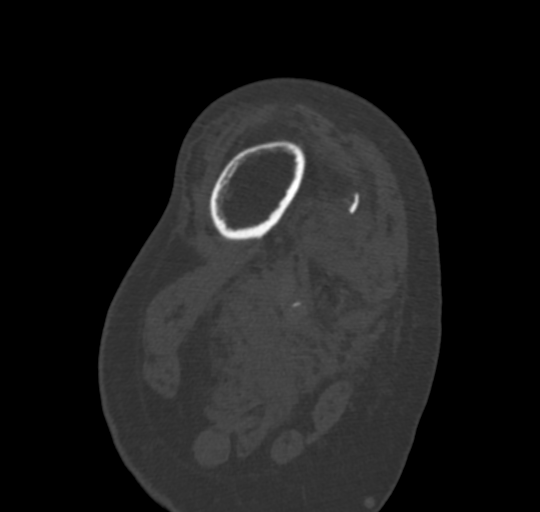

[Series 9: sag st · sagittal · 0.42mm/px · 5 of 189 slices shown, 6 images]
[im 63/189  bone]
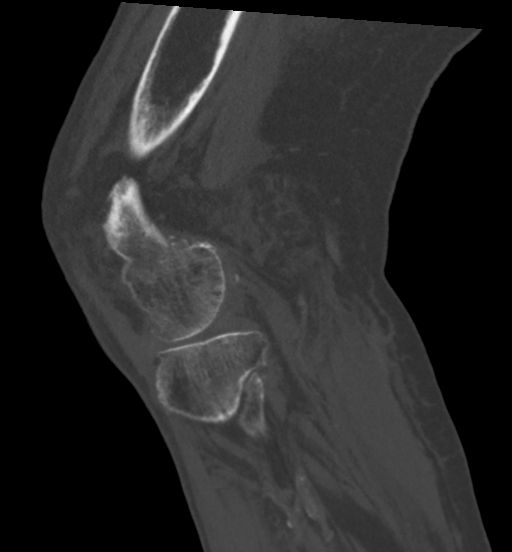
[im 79/189  bone]
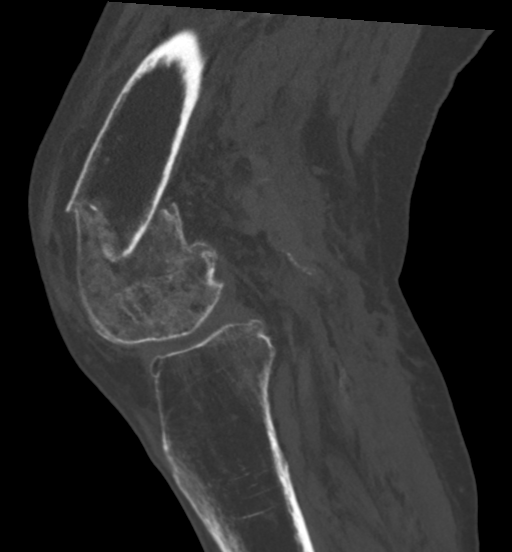
[im 95/189  soft-tissue]
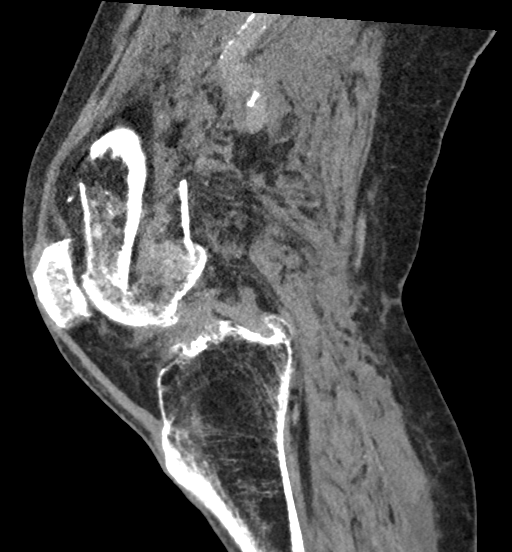
[im 95/189  bone]
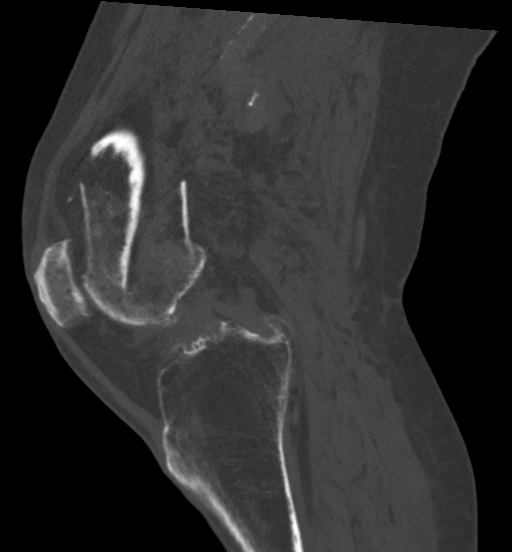
[im 110/189  bone]
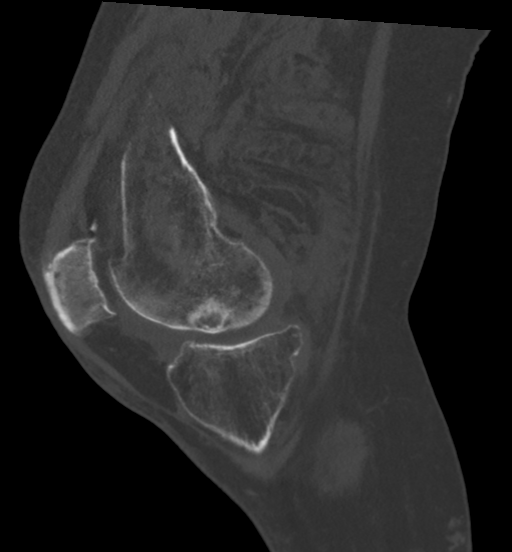
[im 126/189  bone]
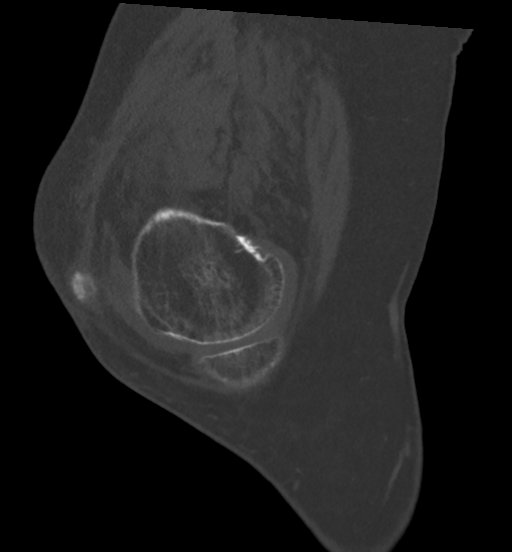

[13 of 33 positions shown; findings below may reference images not displayed]

FINDINGS: Bones/Joint/Cartilage

As seen on the comparison plain films, the patient has a mildly
comminuted and impacted fracture through the metaphysis and
diaphysis of the right femur. There is impaction of up to
approximately 3 cm and posterior displacement of the distal fragment
approximately 1 cm. Longitudinal split component of the fracture
extends through the central aspect of the femoral trochlea with mild
distraction anteriorly of 0.5 cm. Cortical irregularity along the
periphery of the medial patellar facet may be due to a mild
impaction fracture.

No other fracture is identified. Bones are osteopenic. Degenerative
change is present about the knee with some joint space narrowing and
mild osteophytosis.

Ligaments

Suboptimally assessed by CT. The cruciate and collateral ligaments
appear intact.

Muscles and Tendons

Intact.

Soft tissues

Hematoma is seen tracking superficial to the gastrocnemius
musculature. The hematoma measures approximately 5 cm transverse by
1.5 cm AP by 7 cm craniocaudal.
IMPRESSION: Mildly comminuted and impacted fracture through the diaphysis and
metaphysis of the distal right femur includes a longitudinal
component through the central aspect of femoral trochlea.

Findings compatible with a mild impaction fracture through the
periphery of the medial patellar facet. The fracture is incomplete.

Ligamentous structures appear intact on CT scan.

Hematoma in the posterior aspect of the calf.

## 2019-04-16 IMAGING — DX DG KNEE 1-2V*R*
2 series · 2 of 2 positions shown · non-contrast
Comparison: None

CLINICAL DATA: Right knee pain secondary to a fall today.

EXAM:
RIGHT KNEE - 1-2 VIEW

[knee ap]
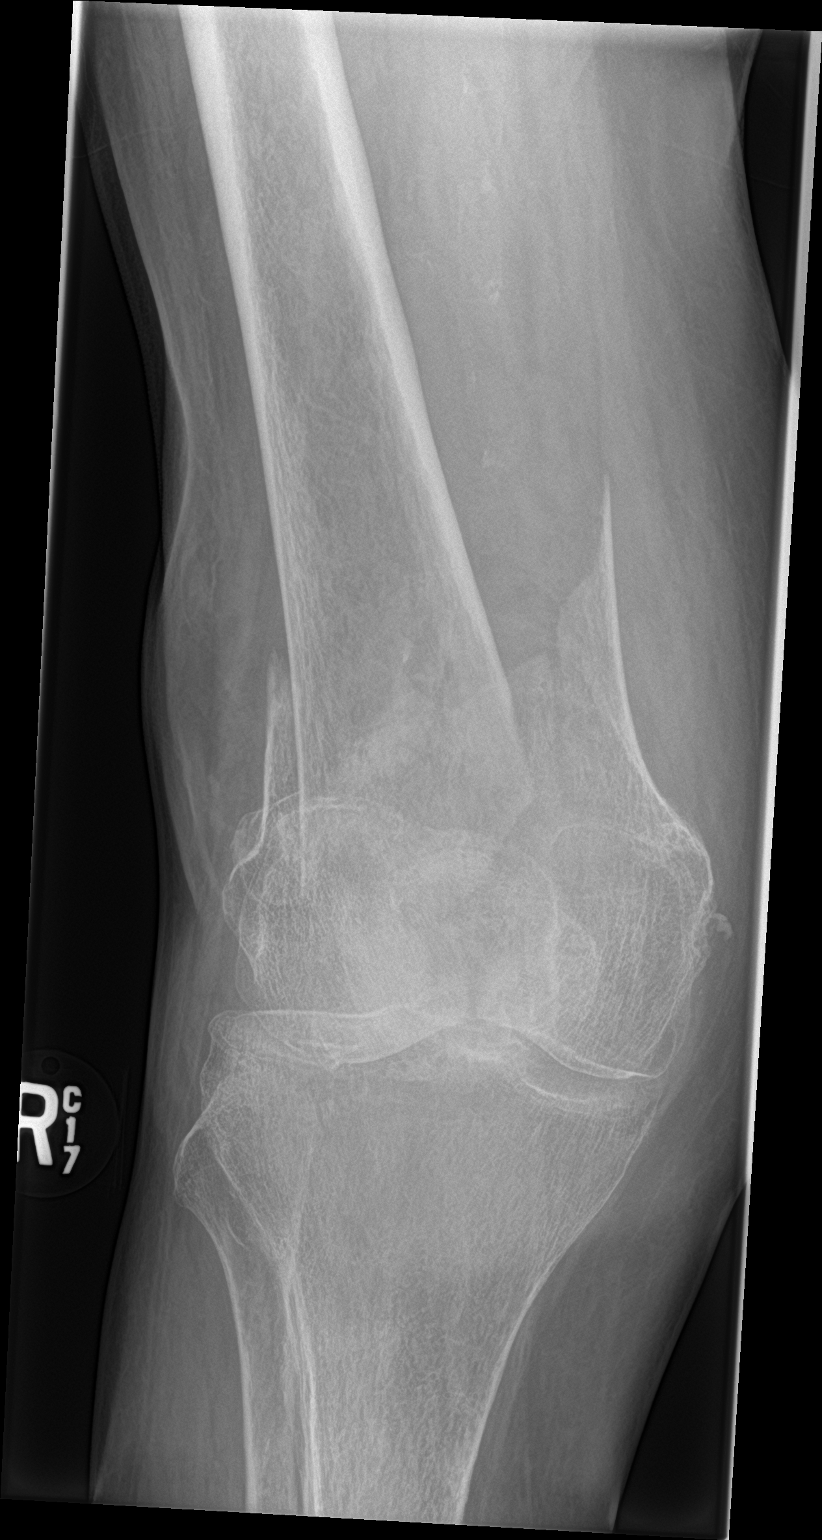

[knee lat]
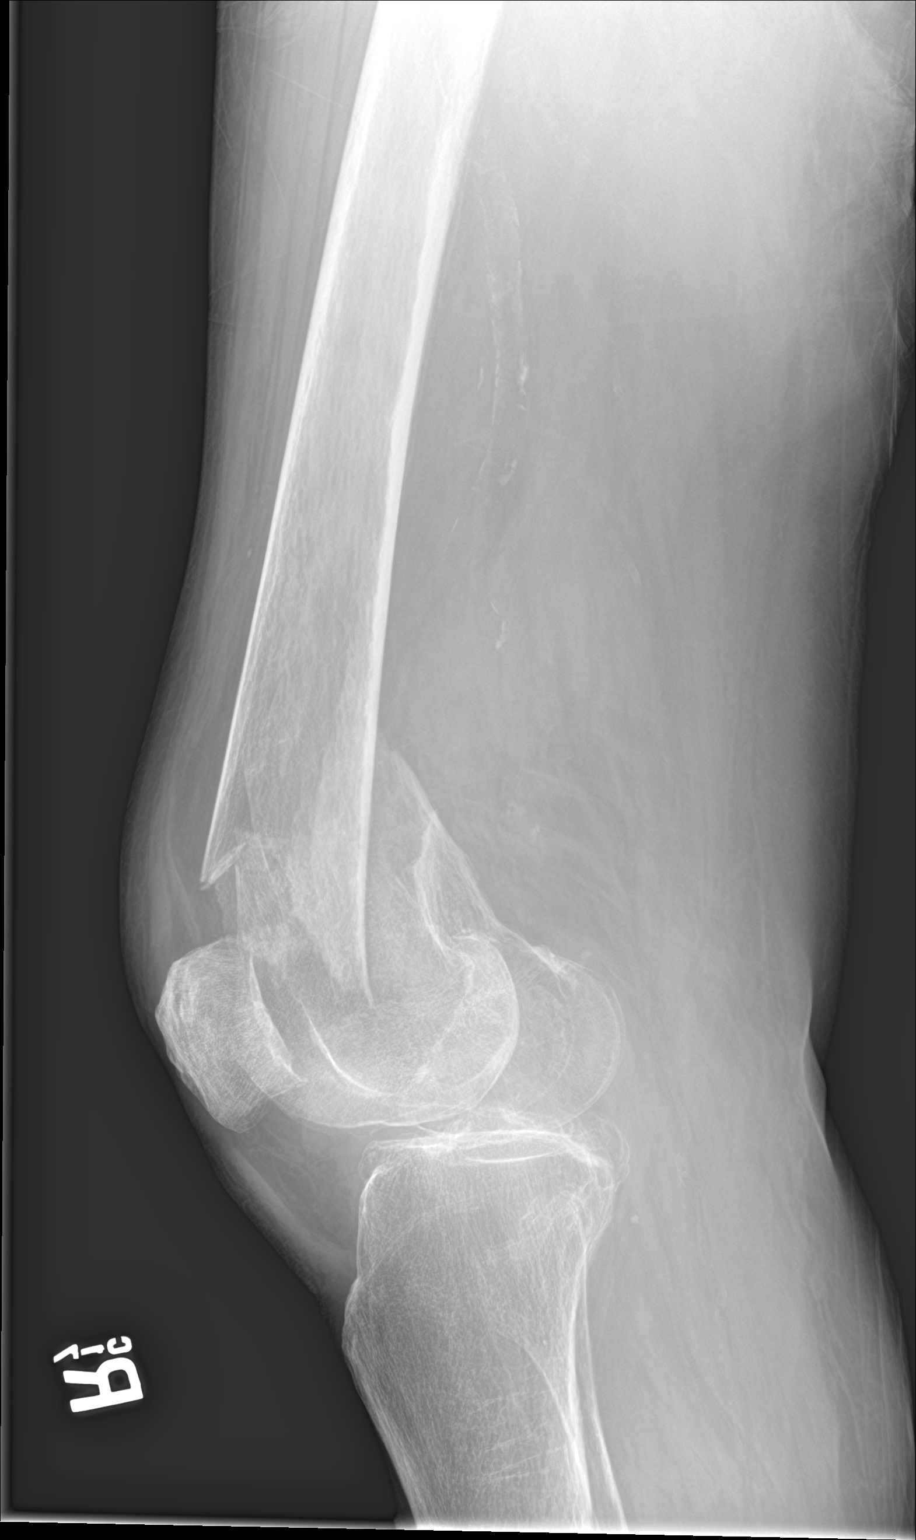

[2 of 2 positions shown; findings below may reference images not displayed]

FINDINGS: There is a comminuted impacted fracture of the distal right femoral
shaft. There is a vertical component of the fracture which extends
through the intercondylar notch.

No dislocation. Diffuse osteopenia. Slight arthritic changes at the
knee.
IMPRESSION: Comminuted impacted distal right femur fracture extending through
the intercondylar notch.
